# Patient Record
Sex: Female | Born: 1945 | Race: Black or African American | Hispanic: No | Marital: Married | Smoking: Former smoker
Health system: Southern US, Community
[De-identification: ages and names within clinical notes are randomized; demographics above are authoritative.]

## PROBLEM LIST (undated history)

## (undated) DIAGNOSIS — Z8489 Family history of other specified conditions: Secondary | ICD-10-CM

## (undated) DIAGNOSIS — K219 Gastro-esophageal reflux disease without esophagitis: Secondary | ICD-10-CM

## (undated) DIAGNOSIS — I10 Essential (primary) hypertension: Secondary | ICD-10-CM

## (undated) DIAGNOSIS — IMO0002 Reserved for concepts with insufficient information to code with codable children: Secondary | ICD-10-CM

## (undated) DIAGNOSIS — D649 Anemia, unspecified: Secondary | ICD-10-CM

## (undated) DIAGNOSIS — I209 Angina pectoris, unspecified: Secondary | ICD-10-CM

## (undated) DIAGNOSIS — C50919 Malignant neoplasm of unspecified site of unspecified female breast: Secondary | ICD-10-CM

## (undated) HISTORY — DX: Gastro-esophageal reflux disease without esophagitis: K21.9

## (undated) HISTORY — DX: Reserved for concepts with insufficient information to code with codable children: IMO0002

## (undated) HISTORY — PX: TUBAL LIGATION: SHX77

## (undated) HISTORY — DX: Essential (primary) hypertension: I10

## (undated) HISTORY — PX: DILATION AND CURETTAGE OF UTERUS: SHX78

## (undated) HISTORY — PX: OVARIAN CYST REMOVAL: SHX89

## (undated) HISTORY — PX: MASTECTOMY: SHX3

## (undated) HISTORY — DX: Malignant neoplasm of unspecified site of unspecified female breast: C50.919

---

## 2000-11-19 ENCOUNTER — Other Ambulatory Visit: Admission: RE | Admit: 2000-11-19 | Discharge: 2000-11-19 | Payer: Self-pay | Admitting: Family Medicine

## 2001-05-12 ENCOUNTER — Encounter: Payer: Self-pay | Admitting: Family Medicine

## 2001-05-12 ENCOUNTER — Ambulatory Visit (HOSPITAL_COMMUNITY): Admission: RE | Admit: 2001-05-12 | Discharge: 2001-05-12 | Payer: Self-pay | Admitting: Family Medicine

## 2001-09-02 ENCOUNTER — Ambulatory Visit (HOSPITAL_COMMUNITY): Admission: RE | Admit: 2001-09-02 | Discharge: 2001-09-02 | Payer: Self-pay | Admitting: Family Medicine

## 2001-09-02 ENCOUNTER — Encounter: Payer: Self-pay | Admitting: Family Medicine

## 2001-09-18 ENCOUNTER — Ambulatory Visit (HOSPITAL_COMMUNITY): Admission: RE | Admit: 2001-09-18 | Discharge: 2001-09-18 | Payer: Self-pay | Admitting: Family Medicine

## 2001-09-18 ENCOUNTER — Encounter: Payer: Self-pay | Admitting: Family Medicine

## 2001-09-23 ENCOUNTER — Other Ambulatory Visit: Admission: RE | Admit: 2001-09-23 | Discharge: 2001-09-23 | Payer: Self-pay | Admitting: Obstetrics and Gynecology

## 2002-05-24 ENCOUNTER — Encounter: Payer: Self-pay | Admitting: Family Medicine

## 2002-05-24 ENCOUNTER — Ambulatory Visit (HOSPITAL_COMMUNITY): Admission: RE | Admit: 2002-05-24 | Discharge: 2002-05-24 | Payer: Self-pay | Admitting: Family Medicine

## 2003-02-28 ENCOUNTER — Ambulatory Visit (HOSPITAL_COMMUNITY): Admission: RE | Admit: 2003-02-28 | Discharge: 2003-02-28 | Payer: Self-pay | Admitting: Internal Medicine

## 2003-02-28 HISTORY — PX: COLONOSCOPY: SHX174

## 2003-06-06 ENCOUNTER — Ambulatory Visit (HOSPITAL_COMMUNITY): Admission: RE | Admit: 2003-06-06 | Discharge: 2003-06-06 | Payer: Self-pay | Admitting: Family Medicine

## 2004-03-20 ENCOUNTER — Ambulatory Visit: Payer: Self-pay | Admitting: Family Medicine

## 2004-03-24 ENCOUNTER — Ambulatory Visit: Payer: Self-pay | Admitting: Family Medicine

## 2004-07-21 ENCOUNTER — Ambulatory Visit: Payer: Self-pay | Admitting: Family Medicine

## 2004-07-25 ENCOUNTER — Ambulatory Visit (HOSPITAL_COMMUNITY): Admission: RE | Admit: 2004-07-25 | Discharge: 2004-07-25 | Payer: Self-pay | Admitting: Unknown Physician Specialty

## 2004-07-30 ENCOUNTER — Ambulatory Visit (HOSPITAL_COMMUNITY): Admission: RE | Admit: 2004-07-30 | Discharge: 2004-07-30 | Payer: Self-pay | Admitting: Family Medicine

## 2004-09-15 ENCOUNTER — Ambulatory Visit: Payer: Self-pay | Admitting: Family Medicine

## 2004-12-22 ENCOUNTER — Ambulatory Visit: Payer: Self-pay | Admitting: Family Medicine

## 2005-05-05 ENCOUNTER — Ambulatory Visit: Payer: Self-pay | Admitting: Family Medicine

## 2005-06-16 ENCOUNTER — Ambulatory Visit: Payer: Self-pay | Admitting: Family Medicine

## 2005-08-10 ENCOUNTER — Ambulatory Visit (HOSPITAL_COMMUNITY): Admission: RE | Admit: 2005-08-10 | Discharge: 2005-08-10 | Payer: Self-pay | Admitting: Family Medicine

## 2005-08-19 ENCOUNTER — Ambulatory Visit: Payer: Self-pay | Admitting: Family Medicine

## 2005-11-19 ENCOUNTER — Ambulatory Visit: Payer: Self-pay | Admitting: Family Medicine

## 2006-01-06 ENCOUNTER — Other Ambulatory Visit: Admission: RE | Admit: 2006-01-06 | Discharge: 2006-01-06 | Payer: Self-pay | Admitting: Family Medicine

## 2006-01-06 ENCOUNTER — Ambulatory Visit: Payer: Self-pay | Admitting: Family Medicine

## 2006-01-07 ENCOUNTER — Ambulatory Visit (HOSPITAL_COMMUNITY): Admission: RE | Admit: 2006-01-07 | Discharge: 2006-01-07 | Payer: Self-pay | Admitting: Family Medicine

## 2006-01-29 ENCOUNTER — Ambulatory Visit: Payer: Self-pay | Admitting: Family Medicine

## 2006-01-29 ENCOUNTER — Ambulatory Visit (HOSPITAL_COMMUNITY): Admission: RE | Admit: 2006-01-29 | Discharge: 2006-01-29 | Payer: Self-pay | Admitting: Family Medicine

## 2006-02-15 ENCOUNTER — Ambulatory Visit: Payer: Self-pay | Admitting: Family Medicine

## 2006-04-14 ENCOUNTER — Ambulatory Visit: Payer: Self-pay | Admitting: Family Medicine

## 2006-06-10 ENCOUNTER — Ambulatory Visit: Payer: Self-pay | Admitting: Family Medicine

## 2006-06-15 ENCOUNTER — Encounter: Payer: Self-pay | Admitting: Family Medicine

## 2006-06-15 LAB — CONVERTED CEMR LAB
ALT: 16 units/L (ref 0–35)
Bilirubin, Direct: 0.1 mg/dL (ref 0.0–0.3)
CO2: 22 meq/L (ref 19–32)
Chloride: 106 meq/L (ref 96–112)
Cholesterol: 163 mg/dL (ref 0–200)
Glucose, Bld: 109 mg/dL — ABNORMAL HIGH (ref 70–99)
Hgb A1c MFr Bld: 6.7 % — ABNORMAL HIGH (ref 4.6–6.1)
Indirect Bilirubin: 0.2 mg/dL (ref 0.0–0.9)
Potassium: 4.9 meq/L (ref 3.5–5.3)
Total Bilirubin: 0.3 mg/dL (ref 0.3–1.2)
Total CHOL/HDL Ratio: 2.5
VLDL: 12 mg/dL (ref 0–40)

## 2006-08-16 ENCOUNTER — Ambulatory Visit (HOSPITAL_COMMUNITY): Admission: RE | Admit: 2006-08-16 | Discharge: 2006-08-16 | Payer: Self-pay | Admitting: Family Medicine

## 2006-09-20 ENCOUNTER — Ambulatory Visit: Payer: Self-pay | Admitting: Family Medicine

## 2006-09-21 ENCOUNTER — Encounter: Payer: Self-pay | Admitting: Family Medicine

## 2007-01-04 ENCOUNTER — Ambulatory Visit: Payer: Self-pay | Admitting: Family Medicine

## 2007-01-10 ENCOUNTER — Ambulatory Visit: Payer: Self-pay | Admitting: Family Medicine

## 2007-01-17 ENCOUNTER — Ambulatory Visit (HOSPITAL_COMMUNITY): Admission: RE | Admit: 2007-01-17 | Discharge: 2007-01-17 | Payer: Self-pay | Admitting: Family Medicine

## 2007-01-25 ENCOUNTER — Encounter: Admission: RE | Admit: 2007-01-25 | Discharge: 2007-01-25 | Payer: Self-pay | Admitting: Family Medicine

## 2007-02-10 ENCOUNTER — Encounter: Admission: RE | Admit: 2007-02-10 | Discharge: 2007-02-10 | Payer: Self-pay | Admitting: Family Medicine

## 2007-02-16 ENCOUNTER — Encounter: Payer: Self-pay | Admitting: Family Medicine

## 2007-02-16 ENCOUNTER — Ambulatory Visit: Payer: Self-pay | Admitting: Family Medicine

## 2007-02-16 ENCOUNTER — Other Ambulatory Visit: Admission: RE | Admit: 2007-02-16 | Discharge: 2007-02-16 | Payer: Self-pay | Admitting: Family Medicine

## 2007-02-16 LAB — CONVERTED CEMR LAB: Pap Smear: NORMAL

## 2007-02-17 ENCOUNTER — Encounter: Payer: Self-pay | Admitting: Family Medicine

## 2007-05-30 ENCOUNTER — Encounter: Admission: RE | Admit: 2007-05-30 | Discharge: 2007-05-30 | Payer: Self-pay | Admitting: Family Medicine

## 2007-06-06 ENCOUNTER — Ambulatory Visit: Payer: Self-pay | Admitting: Family Medicine

## 2007-06-10 ENCOUNTER — Encounter: Payer: Self-pay | Admitting: Family Medicine

## 2007-06-10 LAB — CONVERTED CEMR LAB
AST: 12 units/L (ref 0–37)
Albumin: 4.1 g/dL (ref 3.5–5.2)
BUN: 13 mg/dL (ref 6–23)
CO2: 23 meq/L (ref 19–32)
Calcium: 9.3 mg/dL (ref 8.4–10.5)
Chloride: 105 meq/L (ref 96–112)
Glucose, Bld: 121 mg/dL — ABNORMAL HIGH (ref 70–99)
Potassium: 4.7 meq/L (ref 3.5–5.3)
Total CHOL/HDL Ratio: 2.2
Triglycerides: 58 mg/dL (ref <150)
VLDL: 12 mg/dL (ref 0–40)

## 2007-08-30 ENCOUNTER — Ambulatory Visit (HOSPITAL_COMMUNITY): Admission: RE | Admit: 2007-08-30 | Discharge: 2007-08-30 | Payer: Self-pay | Admitting: Family Medicine

## 2007-10-04 ENCOUNTER — Ambulatory Visit: Payer: Self-pay | Admitting: Family Medicine

## 2007-10-12 ENCOUNTER — Encounter: Payer: Self-pay | Admitting: Family Medicine

## 2007-10-12 DIAGNOSIS — K219 Gastro-esophageal reflux disease without esophagitis: Secondary | ICD-10-CM | POA: Insufficient documentation

## 2007-10-12 DIAGNOSIS — I10 Essential (primary) hypertension: Secondary | ICD-10-CM

## 2007-10-12 DIAGNOSIS — F329 Major depressive disorder, single episode, unspecified: Secondary | ICD-10-CM | POA: Insufficient documentation

## 2007-10-12 DIAGNOSIS — E119 Type 2 diabetes mellitus without complications: Secondary | ICD-10-CM

## 2007-10-12 DIAGNOSIS — F411 Generalized anxiety disorder: Secondary | ICD-10-CM | POA: Insufficient documentation

## 2007-10-12 DIAGNOSIS — M549 Dorsalgia, unspecified: Secondary | ICD-10-CM

## 2008-01-19 ENCOUNTER — Encounter: Payer: Self-pay | Admitting: Family Medicine

## 2008-02-18 ENCOUNTER — Encounter: Payer: Self-pay | Admitting: Family Medicine

## 2008-02-28 ENCOUNTER — Encounter: Payer: Self-pay | Admitting: Family Medicine

## 2008-02-28 ENCOUNTER — Ambulatory Visit: Payer: Self-pay | Admitting: Family Medicine

## 2008-02-28 ENCOUNTER — Other Ambulatory Visit: Admission: RE | Admit: 2008-02-28 | Discharge: 2008-02-28 | Payer: Self-pay | Admitting: Family Medicine

## 2008-02-28 DIAGNOSIS — M549 Dorsalgia, unspecified: Secondary | ICD-10-CM | POA: Insufficient documentation

## 2008-02-28 DIAGNOSIS — R0789 Other chest pain: Secondary | ICD-10-CM | POA: Insufficient documentation

## 2008-03-07 ENCOUNTER — Ambulatory Visit: Payer: Self-pay | Admitting: Internal Medicine

## 2008-03-26 ENCOUNTER — Encounter: Payer: Self-pay | Admitting: Family Medicine

## 2008-03-26 ENCOUNTER — Ambulatory Visit: Payer: Self-pay | Admitting: Internal Medicine

## 2008-03-26 ENCOUNTER — Ambulatory Visit (HOSPITAL_COMMUNITY): Admission: RE | Admit: 2008-03-26 | Discharge: 2008-03-26 | Payer: Self-pay | Admitting: Internal Medicine

## 2008-03-26 HISTORY — PX: COLONOSCOPY: SHX174

## 2008-04-10 ENCOUNTER — Encounter: Payer: Self-pay | Admitting: Family Medicine

## 2008-04-25 ENCOUNTER — Telehealth: Payer: Self-pay | Admitting: Family Medicine

## 2008-05-30 ENCOUNTER — Ambulatory Visit: Payer: Self-pay | Admitting: Family Medicine

## 2008-06-01 ENCOUNTER — Encounter: Payer: Self-pay | Admitting: Family Medicine

## 2008-06-01 LAB — CONVERTED CEMR LAB
Creatinine, Urine: 85.3 mg/dL
Microalb Creat Ratio: 21.5 mg/g (ref 0.0–30.0)
Microalb, Ur: 1.83 mg/dL (ref 0.00–1.89)

## 2008-06-23 ENCOUNTER — Encounter: Payer: Self-pay | Admitting: Family Medicine

## 2008-06-23 LAB — CONVERTED CEMR LAB
Bilirubin, Direct: <0.1 mg/dL (ref 0.0–0.3)
CO2: 25 meq/L (ref 19–32)
Calcium: 9.3 mg/dL (ref 8.4–10.5)
Cholesterol: 150 mg/dL (ref 0–200)
Glucose, Bld: 113 mg/dL — ABNORMAL HIGH (ref 70–99)
LDL Cholesterol: 75 mg/dL (ref 0–99)
Sodium: 140 meq/L (ref 135–145)
Total Bilirubin: 0.2 mg/dL — ABNORMAL LOW (ref 0.3–1.2)
Total Protein: 7.2 g/dL (ref 6.0–8.3)
Triglycerides: 62 mg/dL (ref <150)
VLDL: 12 mg/dL (ref 0–40)

## 2008-07-10 ENCOUNTER — Ambulatory Visit: Payer: Self-pay | Admitting: Family Medicine

## 2008-07-10 ENCOUNTER — Ambulatory Visit (HOSPITAL_COMMUNITY): Admission: RE | Admit: 2008-07-10 | Discharge: 2008-07-10 | Payer: Self-pay | Admitting: Family Medicine

## 2008-07-10 DIAGNOSIS — M25519 Pain in unspecified shoulder: Secondary | ICD-10-CM | POA: Insufficient documentation

## 2008-07-10 DIAGNOSIS — M542 Cervicalgia: Secondary | ICD-10-CM | POA: Insufficient documentation

## 2008-07-16 ENCOUNTER — Encounter: Admission: RE | Admit: 2008-07-16 | Discharge: 2008-07-16 | Payer: Self-pay | Admitting: Family Medicine

## 2008-07-25 ENCOUNTER — Encounter: Payer: Self-pay | Admitting: Family Medicine

## 2008-07-30 ENCOUNTER — Encounter: Admission: RE | Admit: 2008-07-30 | Discharge: 2008-07-30 | Payer: Self-pay | Admitting: Family Medicine

## 2008-07-31 ENCOUNTER — Telehealth: Payer: Self-pay | Admitting: Family Medicine

## 2008-08-01 ENCOUNTER — Encounter: Payer: Self-pay | Admitting: Family Medicine

## 2008-08-02 ENCOUNTER — Encounter: Payer: Self-pay | Admitting: Family Medicine

## 2008-09-03 ENCOUNTER — Ambulatory Visit (HOSPITAL_COMMUNITY): Admission: RE | Admit: 2008-09-03 | Discharge: 2008-09-03 | Payer: Self-pay | Admitting: Family Medicine

## 2008-09-04 ENCOUNTER — Ambulatory Visit: Payer: Self-pay | Admitting: Family Medicine

## 2008-09-04 LAB — CONVERTED CEMR LAB: Hgb A1c MFr Bld: 6 %

## 2008-09-06 ENCOUNTER — Encounter: Payer: Self-pay | Admitting: Family Medicine

## 2008-09-09 DIAGNOSIS — J019 Acute sinusitis, unspecified: Secondary | ICD-10-CM | POA: Insufficient documentation

## 2008-11-09 ENCOUNTER — Encounter: Payer: Self-pay | Admitting: Family Medicine

## 2009-10-24 ENCOUNTER — Ambulatory Visit (HOSPITAL_COMMUNITY): Admission: RE | Admit: 2009-10-24 | Discharge: 2009-10-24 | Payer: Self-pay | Admitting: Family Medicine

## 2010-05-11 LAB — CONVERTED CEMR LAB
Glucose, Bld: 137 mg/dL
Hgb A1c MFr Bld: 6.3 %
OCCULT 1: NEGATIVE

## 2010-08-26 NOTE — H&P (Signed)
NAME:  Shannon Romero, Shannon Romero                ACCOUNT NO.:  [id number]   MEDICAL RECORD NO.:  [id number]           PATIENT TYPE:  AMB   LOCATION:  DAY                           FACILITY:  [facility]   PHYSICIAN:  R. Roetta Sessions, M.D. DATE OF BIRTH:  Dec 01, 1945   DATE OF ADMISSION:  DATE OF DISCHARGE:  LH                              HISTORY & PHYSICAL   CHIEF COMPLAINT:  Time for a colonoscopy, family history of colon  cancer.   HISTORY OF PRESENT ILLNESS:  The patient is a very pleasant 65 year old  [place] female, who was seen last at the time of colonoscopy in  November 2004.  At that time, she had microcytic anemia secondary to  iron deficiency, felt to be due to uterine blood loss.  She has a family  history positive for colon cancer in her father diagnosed at age 78.  At  the time of colonoscopy, she had a few scattered small diverticula  throughout the colon, no evidence of polyps or neoplasm.  She was  advised to follow up in 5 years due to her family history.  She denies  any melena, rectal bleeding, abdominal pain, nausea, or vomiting.  She  has occasional heartburn, but not regularly.  She has occasional  constipation for which she takes stool softeners or  Epsom salt.  Denies  any unintentional weight loss.   CURRENT MEDICATIONS:  1. Metformin 1 g daily.  2. Amlodipine daily.  3. Meloxicam daily.  4. Depression medication t.i.d.  5. Hydrocodone b.i.d.  6. Aspirin 81 mg daily.   ALLERGIES:  No known drug allergies.   PAST MEDICAL HISTORY:  1. Diabetes mellitus.  2. Hypertension.  3. Depression.  4. Bulging disk.   PAST SURGICAL HISTORY:  She had surgery for ovarian cyst, cesarean  section, and tubal ligation.   FAMILY HISTORY:  Father deceased at age 54 due to colorectal cancer.  Mother deceased at age 19 due to MI.  She has multiple siblings with  diabetes.  One brother died of lung cancer.   SOCIAL HISTORY:  She is married.  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  She is  a nonsmoker.  No alcohol use.  She smoked half pack per day for 30 years  in the past.  She has 1 grown healthy son.   REVIEW OF SYSTEMS:  GI:  See HPI.  CONSTITUTIONAL/CARDIOPULMONARY:  No  chest pain, shortness of breath, palpitations, or cough.  GENITOURINARY:  No dysuria or hematuria.   PHYSICAL EXAMINATION:  VITAL SIGNS:  Weight 214.5, height 5 feet 4  inches, temp 98.1, blood pressure 120/84, and pulse 80.  GENERAL:  Pleasant, obese, black female in no acute distress.  SKIN:  Warm and dry.  No jaundice.  HEENT:  Sclerae nonicteric.  Oropharyngeal mucosa moist and pink.  No  lesions, erythema, or exudate.  No lymphadenopathy or thyromegaly.  CHEST:  Lungs are clear to auscultation.  CARDIAC:  Regular rate and rhythm.  Normal S1 and S2.  No  murmurs, rubs,  or gallops.  ABDOMEN:  Positive bowel sounds.  Abdomen is soft, nontender, and  nondistended.  No organomegaly or masses.  No rebound or guarding.  No  abdominal bruits or hernias.  LOWER EXTREMITIES:  No edema.   IMPRESSION:  Shannon Romero is a pleasant 65 year old African American female  with family history of colorectal cancer in a first-degree relative at  age 27.  She is due for surveillance colonoscopy at this time.   RECOMMENDATIONS:  1. Colonoscopy in the near future with Dr. Jena Gauss.  2. We will have her hold her meloxicam and aspirin for 4 days prior      procedure.      [name], Pricilla Larsson, M.D.     LL/MEDQ  D:  03/07/2008  T:  03/08/2008  Job:  [id number]   cc:   Mila Homer. Sudie Bailey, M.D.  Fax: [phone]

## 2010-08-26 NOTE — Op Note (Signed)
NAME:  Shannon Romero, Shannon Romero                ACCOUNT NO.:  [id number]   MEDICAL RECORD NO.:  [id number]          PATIENT TYPE:  AMB   LOCATION:  DAY                           FACILITY:  [facility]   PHYSICIAN:  R. Roetta Sessions, M.D. DATE OF BIRTH:  12-03-1945   DATE OF PROCEDURE:  03/26/2008  DATE OF DISCHARGE:                               OPERATIVE REPORT   INDICATIONS FOR PROCEDURE:  A 65 year old African American female with  no lower GI tract symptoms, presents for high-risk screening  colonoscopy.  Past family history of colon cancer in a first-degree  relative at a young age.  Last colonoscopy was in November 2004.  Risks,  benefits, alternatives, and limitations of this procedure have been  reviewed, questions answered.  Please see documentation in medical  record.   PROCEDURE NOTE:  O2 saturation, blood pressure, and pulse of this  patient was monitored throughout the entire procedure.  Conscious  sedation, Versed 6 mg IV and Demerol 125 mg IV in divided doses.   INSTRUMENT:  Pentax video chip system.   FINDINGS:  Digital rectal exam revealed no abnormalities.  Endoscopic  findings:  Prep was good.  Colon:  Colonic mucosa was surveyed from the  rectosigmoid junction through the left transverse, right colon,  appendiceal orifice, ileocecal valve, and cecum.  These structures were  well seen, photographed for the record.  Terminal ileum was intubated 10  cm.  From this level, scope was slowly and cautiously withdrawn.  All  previously mentioned mucosal surfaces were again seen.  Terminal ileum  was intubated to 10 cm.  The patient then had a scattered pancolonic  diverticula and colonic mucosa appeared normal.  Terminal ileum mucosa  appeared normal.  Scope was pulled down the rectum with thorough  examination of rectal mucosa, including retroflexed view of the anal  verge demonstrated no abnormalities.  The patient tolerated the  procedure well and was reacted in Endoscopy.   IMPRESSION:  Normal rectum, scattered pancolonic diverticula and colonic  mucosa appeared normal, normal terminal ileum.   RECOMMENDATIONS:  Repeat screening colonoscopy in 5 years.  Diverticulosis literature provided to Ms. Speagle.      Jonathon Bellows, M.D.  Electronically Signed     RMR/MEDQ  D:  03/26/2008  T:  03/27/2008  Job:  [id number]   cc:   Milus Mallick. Lodema Hong, M.D.  Fax: [phone]

## 2010-08-26 NOTE — H&P (Signed)
NAME:  Worrell, Adlynn                ACCOUNT NO.:  [id number]   MEDICAL RECORD NO.:  [id number]          PATIENT TYPE:  AMB   LOCATION:  DAY                           FACILITY:  [facility]   PHYSICIAN:  R. Roetta Sessions, M.D. DATE OF BIRTH:  06-25-1945   DATE OF ADMISSION:  DATE OF DISCHARGE:  LH                              HISTORY & PHYSICAL   CHIEF COMPLAINT:  Time for a colonoscopy, family history of colon  cancer.   HISTORY OF PRESENT ILLNESS:  The patient is a very pleasant 65 year old  [place] female, who was seen last at the time of colonoscopy in  November 2004.  At that time, she had microcytic anemia secondary to  iron deficiency, felt to be due to uterine blood loss.  She has a family  history positive for colon cancer in her father diagnosed at age 54.  At  the time of colonoscopy, she had a few scattered small diverticula  throughout the colon, no evidence of polyps or neoplasm.  She was  advised to follow up in 5 years due to her family history.  She denies  any melena, rectal bleeding, abdominal pain, nausea, or vomiting.  She  has occasional heartburn, but not regularly.  She has occasional  constipation for which she takes stool softeners or  Epsom salt.  Denies  any unintentional weight loss.   CURRENT MEDICATIONS:  1. Metformin 1 g daily.  2. Amlodipine daily.  3. Meloxicam daily.  4. Depression medication t.i.d.  5. Hydrocodone b.i.d.  6. Aspirin 81 mg daily.   ALLERGIES:  No known drug allergies.   PAST MEDICAL HISTORY:  1. Diabetes mellitus.  2. Hypertension.  3. Depression.  4. Bulging disk.   PAST SURGICAL HISTORY:  She had surgery for ovarian cyst, cesarean  section, and tubal ligation.   FAMILY HISTORY:  Father deceased at age 60 due to colorectal cancer.  Mother deceased at age 86 due to MI.  She has multiple siblings with  diabetes.  One brother died of lung cancer.   SOCIAL HISTORY:  She is married.  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  She is  a nonsmoker.  No alcohol use.  She smoked half pack per day for 30 years  in the past.  She has 1 grown healthy son.   REVIEW OF SYSTEMS:  GI:  See HPI.  CONSTITUTIONAL/CARDIOPULMONARY:  No  chest pain, shortness of breath, palpitations, or cough.  GENITOURINARY:  No dysuria or hematuria.   PHYSICAL EXAMINATION:  VITAL SIGNS:  Weight 214.5, height 5 feet 4  inches, temp 98.1, blood pressure 120/84, and pulse 80.  GENERAL:  Pleasant, obese, black female in no acute distress.  SKIN:  Warm and dry.  No jaundice.  HEENT:  Sclerae nonicteric.  Oropharyngeal mucosa moist and pink.  No  lesions, erythema, or exudate.  No lymphadenopathy or thyromegaly.  CHEST:  Lungs are clear to auscultation.  CARDIAC:  Regular rate and rhythm.  Normal S1 and S2.  No murmurs,  rubs,  or gallops.  ABDOMEN:  Positive bowel sounds.  Abdomen is soft, nontender, and  nondistended.  No organomegaly or masses.  No rebound or guarding.  No  abdominal bruits or hernias.  LOWER EXTREMITIES:  No edema.   IMPRESSION:  Ms. Choe is a pleasant 65 year old African American female  with family history of colorectal cancer in a first-degree relative at  age 9.  She is due for surveillance colonoscopy at this time.   RECOMMENDATIONS:  1. Colonoscopy in the near future with Dr. Jena Gauss.  2. We will have her hold her meloxicam and aspirin for 4 days prior      procedure.      [name], P.AJonathon Bellows, M.D.  Electronically Signed    LL/MEDQ  D:  03/07/2008  T:  03/08/2008  Job:  [id number]   cc:   Mila Homer. Sudie Bailey, M.D.  Fax: [phone]

## 2010-08-29 NOTE — H&P (Signed)
NAME:  Shannon Romero, Shannon Romero NO.:  [id number]   MEDICAL RECORD NO.:  [id number]                  PATIENT TYPE:   LOCATION:                                       FACILITY:   PHYSICIAN:  Lionel December, M.D.                 DATE OF BIRTH:  March 23, 1946   DATE OF ADMISSION:  DATE OF DISCHARGE:                                HISTORY & PHYSICAL   CHIEF COMPLAINT:  Anemia, family history of colon carcinoma and needs  colonoscopy.   HISTORY OF PRESENT ILLNESS:  The patient is a 65 year old African-American  female who presents to our office prior to colonoscopy due to a family  history of colon carcinoma in father who was diagnosed at age 77.  The  patient has not yet had screening colonoscopy.  She also has brought her  records from Dr. Michelle Nasuti office where she has had a normally low  hemoglobin at 12.0 and hematocrit at 36.2 with a low MCV of 74.6.  It was  felt that her mild anemia could possibly be due to menorrhagia.  She also  takes aspirin 81 mg daily and recently, approximately two months ago,  discontinued Vioxx.  She reports her bowel movements are normal except for  constipation where she has bowel movements every four to five days.  She has  a history of taking Ex-Lax two tablets which usually results in good relief.  However, sometimes she is required to use Epsom salts.  She denies any  nausea, vomiting or abdominal pain.  She denies any melena or hematochezia.  She denies any history of GERD or peptic ulcer disease.  She denies any  dyspepsia or indigestion.   PAST MEDICAL HISTORY:  1. Diabetes.  2. Depression.  3. Chronic back pain.  4. Dysmenorrhea.   PAST SURGICAL HISTORY:  1. Ovarian cystectomy.  2. Tubal ligation.  3. Cesarean section.   MEDICATIONS:  1. Aspirin 81 mg daily.  2. Darvocet-N 100 t.i.d.  3. Glucophage 500 mg two tablets q.a.m. and one tablet q.p.m.  4. Megace 20 mg daily.  5. Tylenol Arthritis p.r.n.  6. Zoloft 50  mg q.h.s.   ALLERGIES:  No known drug allergies.   FAMILY HISTORY:  Father was diagnosed at age 24 with colon carcinoma and has  since deceased.  There is also a positive family history for diabetes as  well as hypertension and lung carcinoma.   SOCIAL HISTORY:  She has been married for 35 years.  She has one grown  healthy son.  She is employed full time with [employer] and cares  for her elderly mother-in-law.  She currently does not smoke; however, she  reports a half pack per day for 30 years which she quit several years ago.  She denies any alcohol or drug use.   REVIEW OF SYMPTOMS:  CONSTITUTIONAL:  Weight is slightly increasing which  she feels is secondary to increased caloric intake.  She also has occasional  chills, however, she denies any fever.  NEUROLOGIC:  She denies any  weakness.  ENDOCRINE:  She does have diabetes with the last hemoglobin A1c  of 6.9 and being followed by Dr. Sudie Bailey.  GI:  See history of present  illness.  CARDIOVASCULAR:  She denies any chest pain or palpitations.  PULMONARY:  She denies any shortness of breath, dyspnea or cough.  SKIN:  She denies any rash except for occasional eczema.   PHYSICAL EXAMINATION:  VITAL SIGNS:  Weight is 220 pounds, height 63 inches,  temperature 98.3, blood pressure 130/80, pulse 70.  GENERAL:  The patient is a 65 year old obese African-American female who  appears her stated age.  She is well developed, well nourished and in no  apparent distress.  HEENT:  Sclerae are clear and anicteric.  Conjunctivae are pink.  Oropharynx  is pink and moist with good dentition.  Neck is supple without any masses or  thyromegaly.  CHEST:  Heart is regular rate and rhythm without murmurs, clicks, rubs or  gallops.  LUNGS:  Clear to auscultation bilaterally.  ABDOMEN:  Obese with a vertical cesarean section scar which is well healed.  She does have positive bowel sounds times four. Her abdomen is soft, non-  tender and  non-distended.  There is no palpable organomegaly.  EXTREMITIES:  2+ pedal pulses bilaterally.  No pedal edema.   LABORATORY DATA:  CBC from January 26, 2003:  WBC 9.7, hemoglobin 13.3,  hematocrit 40.9, platelets 305,000, MCV 79.7.  Sodium 140, potassium 3.8,  chloride 107, C02 22, glucose 83, BUN 8, creatinine 0.8.  Total bilirubin  0.4, alkaline phosphatase 71, AST 12, ALT 20, total protein 8.1, albumin  4.4, calcium 9.6.  Folate was greater than 20, iron 42, TIBC 324, TIBC %  saturation 13.   ASSESSMENT:  The patient is a 65 year old African-American female with a  known family history of colorectal carcinoma.  She also presents with slight  microcytic anemia.  She does have a history of dysmenorrhea and menorrhagia  which may be a culprit for her anemia.  However, we cannot rule out an  occult gastrointestinal bleed as well.  I discussed with the patient the  dire need for colonoscopy by Dr. Karilyn Cota.  I also discussed the risks and  benefits which include, but are not limited to, bleeding, perforation and  infection.  She agreed with this plan.  I have also asked her to decrease  her diabetes medication to half dose the day prior to and of the procedure.  She will also hold her aspirin for three days prior to the procedure.  Consent will be obtained.    RECOMMENDATIONS:  1. Colonoscopy to be scheduled with Dr. Karilyn Cota; consent will be obtained.  2. Medication adjustments as directed.  3. Follow-up pending colonoscopy results.     _____________________________________  ___________________________________________  Nicholas Lose, N.P.               Lionel December, M.D.   KC/MEDQ  D:  02/13/2003  T:  02/13/2003  Job:  [id number]   cc:   Mila Homer. Sudie Bailey, M.D.  [address] [place], [place] [address]  Fax: [phone]

## 2010-08-29 NOTE — Op Note (Signed)
NAME:  Shannon Romero, Shannon Romero                          ACCOUNT NO.:  [id number]   MEDICAL RECORD NO.:  [id number]                   PATIENT TYPE:  AMB   LOCATION:  DAY                                  FACILITY:  [facility]   PHYSICIAN:  Lionel December, M.D.                 DATE OF BIRTH:  [birth year]   DATE OF PROCEDURE:  02/28/2003  DATE OF DISCHARGE:                                 OPERATIVE REPORT   PROCEDURE:  Total colonoscopy.   ENDOSCOPIST:  Lionel December, M.D.   INDICATIONS:  This patient is a 65 year old African-American female who was  recently found to have microcytic anemia secondary to iron deficiency.  This  is possibly related to uterine blood loss.  She does not have any GI  symptoms; however, the family history is positive for colon carcinoma in a  father who was diagnosed at age 37.  She is, therefore, undergoing  colonoscopy both for diagnostic and screening purposes.  The procedure and  risks were reviewed with the patient and informed consent was obtained.   PREOPERATIVE MEDICATIONS:  Demerol 50 mg IV and Versed 6 mg IV.   FINDINGS:  Procedure performed in endoscopy suite.  The patient's vital  signs and O2 saturation were monitored during the procedure and remained  stable.  The patient was placed in the left lateral recumbent position and  rectal examination was performed.  No abnormality noted on external or  digital exam.   Olympus videoscope was placed in the rectum and advanced under vision into  the sigmoid colon and beyond.  Preparation was satisfactory.  The scope was  passed to the cecum which was identified by appendiceal orifice and the  ileocecal valve.  Pictures were taken for the record.  As the scope was  withdrawn the colonic mucosa was once again carefully examined.  There were  a few small scattered diverticula throughout the colon.  No polyps or tumor  masses were noted. The rectal mucosa was normal.   The scope was retroflexed to examine the anorectal  junction and prominent  anal papilla were noted.  The endoscope was straightened and withdrawn.  The  patient tolerated the procedure well.   FINAL DIAGNOSES:  1. A few scattered small diverticula throughout the colon.  2. No evidence of colonic polyps or neoplasm.  3. Polyps or other lesions which potentially cause GI blood loss.   RECOMMENDATIONS:  1. She will resume her usual diet.  2. High fiber diet.  3. Will start her on ferrous sulfate 325 mg p.o. b.i.d.  4. She will follow with Dr. Sudie Bailey in 3 months when she will need to have     a CBC.  5. Given positive family history of colon carcinoma, it is recommended that     she return for screening exam 5 years from now.      ___________________________________________  Lionel December, M.D.   NR/MEDQ  D:  02/28/2003  T:  03/01/2003  Job:  [id number]   cc:   Mila Homer. Sudie Bailey, M.D.  [address] [place], [place] [address]  Fax: [phone]

## 2011-01-02 ENCOUNTER — Encounter: Payer: Self-pay | Admitting: Family Medicine

## 2011-01-05 ENCOUNTER — Ambulatory Visit (INDEPENDENT_AMBULATORY_CARE_PROVIDER_SITE_OTHER): Payer: Medicare HMO | Admitting: Family Medicine

## 2011-01-05 ENCOUNTER — Encounter: Payer: Self-pay | Admitting: Family Medicine

## 2011-01-05 ENCOUNTER — Other Ambulatory Visit: Payer: Self-pay | Admitting: Family Medicine

## 2011-01-05 VITALS — BP 124/70 | HR 84 | Resp 16 | Ht 63.0 in | Wt 216.1 lb

## 2011-01-05 DIAGNOSIS — Z13220 Encounter for screening for lipoid disorders: Secondary | ICD-10-CM

## 2011-01-05 DIAGNOSIS — R5383 Other fatigue: Secondary | ICD-10-CM

## 2011-01-05 DIAGNOSIS — I10 Essential (primary) hypertension: Secondary | ICD-10-CM

## 2011-01-05 DIAGNOSIS — Z23 Encounter for immunization: Secondary | ICD-10-CM

## 2011-01-05 DIAGNOSIS — M549 Dorsalgia, unspecified: Secondary | ICD-10-CM

## 2011-01-05 DIAGNOSIS — Z139 Encounter for screening, unspecified: Secondary | ICD-10-CM

## 2011-01-05 DIAGNOSIS — R5381 Other malaise: Secondary | ICD-10-CM

## 2011-01-05 DIAGNOSIS — E119 Type 2 diabetes mellitus without complications: Secondary | ICD-10-CM

## 2011-01-05 DIAGNOSIS — E669 Obesity, unspecified: Secondary | ICD-10-CM

## 2011-01-05 MED ORDER — INFLUENZA VAC TYPES A & B PF IM SUSP: 0.5000 mL | Freq: Once | INTRAMUSCULAR | Status: DC

## 2011-01-05 MED ORDER — HYDROCODONE-ACETAMINOPHEN 5-500 MG PO TABS: ORAL_TABLET | ORAL | Status: DC

## 2011-01-05 MED ORDER — PREDNISONE 5 MG PO KIT: 5.0000 mg | PACK | ORAL | Status: AC

## 2011-01-05 MED ORDER — METHYLPREDNISOLONE ACETATE 80 MG/ML IJ SUSP
80.0000 mg | Freq: Once | INTRAMUSCULAR | Status: AC
  Administered 2011-01-05: 80 mg via INTRAMUSCULAR

## 2011-01-05 MED ORDER — KETOROLAC TROMETHAMINE 60 MG/2ML IM SOLN
60.0000 mg | Freq: Once | INTRAMUSCULAR | Status: AC
  Administered 2011-01-05: 60 mg via INTRAMUSCULAR

## 2011-01-05 NOTE — Assessment & Plan Note (Addendum)
Will f/u on hBa1C to determine control

## 2011-01-05 NOTE — Patient Instructions (Addendum)
CPE in November.  Injections today for back pain radiating down right leg, med sent to the pharmacy, call in 3 weeks if you think you need to be referred for injection in spine  Fasting labs asap  Mammmogram will be scheduled  Tdap and flu vaccine today  It is important that you exercise regularly at least 30 minutes 5 times a week. If you develop chest pain, have severe difficulty breathing, or feel very tired, stop exercising immediately and seek medical attention   A healthy diet is rich in fruit, vegetables and whole grains. Poultry fish, nuts and beans are a healthy choice for protein rather then red meat. A low sodium diet and drinking 64 ounces of water daily is generally recommended. Oils and sweet should be limited. Carbohydrates especially for those who are diabetic or overweight, should be limited to 30-45 gram per meal. It is important to eat on a regular schedule, at least 3 times daily. Snacks should be primarily fruits, vegetables or nuts. You are referred for eye exam

## 2011-01-05 NOTE — Progress Notes (Signed)
  Subjective:    Patient ID: Shannon Romero, female    DOB: 10/16/1945, 65 y.o.   MRN: [id number]  HPI Pt is in to re-establish care, recently got onto medicare, Denies any new problems. Has not been testing blood sugars regularly, needs testing supplies and meter. C/o uncontrolled back pain wants help with this as far as medication and injections. Has had epidurals in the past, will wait to see what med she gets here does   Review of Systems See HPI Denies recent fever or chills. Denies sinus pressure, nasal congestion, ear pain or sore throat. Denies chest congestion, productive cough or wheezing. Denies chest pains, palpitations and leg swelling Denies abdominal pain, nausea, vomiting,diarrhea or constipation.   Denies dysuria, frequency, hesitancy or incontinence.  Denies headaches, seizures, numbness, or tingling. Denies depression, anxiety or insomnia. Denies skin break down or rash.        Objective:   Physical Exam  Patient alert and oriented and in no cardiopulmonary distress.  HEENT: No facial asymmetry, EOMI, no sinus tenderness,  oropharynx pink and moist.  Neck supple no adenopathy.  Chest: Clear to auscultation bilaterally.  CVS: S1, S2 no murmurs, no S3.  ABD: Soft non tender. Bowel sounds normal.  Ext: No edema  MS: Decreased  ROM spine,adeqiuate in  shoulders, hips and knees.  Skin: Intact, no ulcerations or rash noted.  Psych: Good eye contact, normal affect. Memory intact not anxious or depressed appearing.  CNS: CN 2-12 intact, power,  normal throughout.       Assessment & Plan:

## 2011-01-05 NOTE — Assessment & Plan Note (Signed)
Deteriorated, has established disc disease, anti inflammatories and vicodin at night as needed

## 2011-01-05 NOTE — Assessment & Plan Note (Signed)
Controlled, no change in medication  

## 2011-01-05 NOTE — Assessment & Plan Note (Signed)
Improved. Pt applauded on succesful weight loss through lifestyle change, and encouraged to continue same. Weight loss goal set for the next several months.  

## 2011-01-06 LAB — CBC WITH DIFFERENTIAL/PLATELET
Basophils Absolute: 0 10*3/uL (ref 0.0–0.1)
Basophils Relative: 0 % (ref 0–1)
HCT: 38.4 % (ref 36.0–46.0)
MCHC: 31.5 g/dL (ref 30.0–36.0)
Monocytes Absolute: 0.6 10*3/uL (ref 0.1–1.0)
Neutro Abs: 5.1 10*3/uL (ref 1.7–7.7)
Platelets: 281 10*3/uL (ref 150–400)
RDW: 17.2 % — ABNORMAL HIGH (ref 11.5–15.5)

## 2011-01-06 LAB — LIPID PANEL
Cholesterol: 152 mg/dL (ref 0–200)
Total CHOL/HDL Ratio: 3.2 Ratio
Triglycerides: 70 mg/dL (ref <150)
VLDL: 14 mg/dL (ref 0–40)

## 2011-01-07 ENCOUNTER — Telehealth: Payer: Self-pay | Admitting: *Deleted

## 2011-01-07 LAB — COMPLETE METABOLIC PANEL WITH GFR
AST: 23 U/L (ref 0–37)
Albumin: 4 g/dL (ref 3.5–5.2)
Alkaline Phosphatase: 66 U/L (ref 39–117)
BUN: 23 mg/dL (ref 6–23)
Creat: 1.05 mg/dL (ref 0.50–1.10)
Potassium: 4.5 mEq/L (ref 3.5–5.3)

## 2011-01-07 LAB — MICROALBUMIN / CREATININE URINE RATIO
Creatinine, Urine: 73.9 mg/dL
Microalb Creat Ratio: 6.8 mg/g (ref 0.0–30.0)
Microalb, Ur: 0.5 mg/dL (ref 0.00–1.89)

## 2011-01-07 NOTE — Telephone Encounter (Signed)
Patient aware of lab results, mailed info on diabetic class at [facility]

## 2011-01-07 NOTE — Telephone Encounter (Signed)
Message copied by Diamantina Monks on Wed Jan 07, 2011  8:44 AM ------      Message from: Syliva Overman MD E      Created: Wed Jan 07, 2011  7:32 AM       pls advise pt to reduce carbs and sweets, encourage her to attend class to improve her blood sugar control goal HBA1C is 6.5

## 2011-01-08 ENCOUNTER — Ambulatory Visit (HOSPITAL_COMMUNITY)
Admission: RE | Admit: 2011-01-08 | Discharge: 2011-01-08 | Disposition: A | Discharge status: Home / Self Care | Payer: Medicare HMO | Source: Ambulatory Visit | Attending: Family Medicine | Admitting: Family Medicine

## 2011-01-08 DIAGNOSIS — Z1231 Encounter for screening mammogram for malignant neoplasm of breast: Secondary | ICD-10-CM | POA: Insufficient documentation

## 2011-01-08 DIAGNOSIS — Z139 Encounter for screening, unspecified: Secondary | ICD-10-CM

## 2011-03-02 ENCOUNTER — Encounter: Payer: Self-pay | Admitting: Family Medicine

## 2011-03-11 ENCOUNTER — Other Ambulatory Visit (HOSPITAL_COMMUNITY)
Admission: RE | Admit: 2011-03-11 | Discharge: 2011-03-11 | Disposition: A | Discharge status: Home / Self Care | Payer: Medicare HMO | Source: Ambulatory Visit | Attending: Family Medicine | Admitting: Family Medicine

## 2011-03-11 ENCOUNTER — Encounter: Payer: Self-pay | Admitting: Family Medicine

## 2011-03-11 ENCOUNTER — Ambulatory Visit (INDEPENDENT_AMBULATORY_CARE_PROVIDER_SITE_OTHER): Payer: Medicare HMO | Admitting: Family Medicine

## 2011-03-11 VITALS — BP 110/74 | HR 78 | Resp 16 | Ht 63.0 in | Wt 215.4 lb

## 2011-03-11 DIAGNOSIS — L0292 Furuncle, unspecified: Secondary | ICD-10-CM

## 2011-03-11 DIAGNOSIS — Z01419 Encounter for gynecological examination (general) (routine) without abnormal findings: Secondary | ICD-10-CM | POA: Insufficient documentation

## 2011-03-11 DIAGNOSIS — I10 Essential (primary) hypertension: Secondary | ICD-10-CM

## 2011-03-11 DIAGNOSIS — Z1211 Encounter for screening for malignant neoplasm of colon: Secondary | ICD-10-CM

## 2011-03-11 DIAGNOSIS — L0293 Carbuncle, unspecified: Secondary | ICD-10-CM

## 2011-03-11 DIAGNOSIS — Z0000 Encounter for general adult medical examination without abnormal findings: Secondary | ICD-10-CM

## 2011-03-11 DIAGNOSIS — E119 Type 2 diabetes mellitus without complications: Secondary | ICD-10-CM

## 2011-03-11 DIAGNOSIS — R5381 Other malaise: Secondary | ICD-10-CM

## 2011-03-11 DIAGNOSIS — J019 Acute sinusitis, unspecified: Secondary | ICD-10-CM

## 2011-03-11 LAB — HEMOCCULT GUIAC POC 1CARD (OFFICE)

## 2011-03-11 MED ORDER — METFORMIN HCL 1000 MG PO TABS: 1000.0000 mg | ORAL_TABLET | Freq: Two times a day (BID) | ORAL | Status: DC

## 2011-03-11 MED ORDER — CARISOPRODOL 350 MG PO TABS: 350.0000 mg | ORAL_TABLET | Freq: Every evening | ORAL | Status: DC | PRN

## 2011-03-11 MED ORDER — DOXYCYCLINE HYCLATE 100 MG PO TABS: 100.0000 mg | ORAL_TABLET | Freq: Two times a day (BID) | ORAL | Status: AC

## 2011-03-11 NOTE — Patient Instructions (Addendum)
F/U in end April HBA1C non fasting in January    Hba1C and chem 7 in end April non fasting  Medication is sent in for the nostril, if the problem continues please call and let me know so can refer you to ENT for further eval  You are referred for a colonscopy in 2013, due to fam h/o colon ca

## 2011-03-11 NOTE — Progress Notes (Signed)
  Subjective:    Patient ID: Shannon Romero, female    DOB: [birth year], 65 y.o.   MRN: [id number]  HPI The PT is here for annual exam  and review of any available recent lab and radiology data.  Preventive health is updated, specifically  Cancer screening and Immunization.   Questions or concerns regarding consultations or procedures which the PT has had in the interim are  addressed. The PT denies any adverse reactions to current medications since the last visit.  There are no new concerns.  C/o foul smell in right nostril x 1 to 2 months, sometimes bloody.  C/o right anterior breast discomfort intermittent x 1 month.  Father had colon ca at age 23, needs colonscopy      Review of Systems     Objective:   Physical Exam Pleasant obesefemale, alert and oriented x 3, in no cardio-pulmonary distress. Afebrile. HEENT No facial trauma or asymetry. Sinuses non tender.  EOMI, PERTL, fundoscopic exam is normal, no hemorhage or exudate.  External ears normal, tympanic membranes clear.No lesion visible in either nostril, will treat based on symptoms Oropharynx moist, no exudate,poor  dentition. Neck: supple, no adenopathy,JVD or thyromegaly.No bruits.  Chest: Clear to ascultation bilaterally.No crackles or wheezes. Non tender to palpation  Breast: No asymetry,no masses. No nipple discharge or inversion. No axillary or supraclavicular adenopathy  Cardiovascular system; Heart sounds normal,  S1 and  S2 ,no S3.  No murmur, or thrill. Apical beat not displaced Peripheral pulses normal.  Abdomen: Soft, non tender, no organomegaly or masses. No bruits. Bowel sounds normal. No guarding, tenderness or rebound.  Rectal:  No mass. Guaiac negative stool.  GU: External genitalia normal. No lesions. Vaginal canal normal.No discharge. Uterus normal size, no adnexal masses, no cervical motion or adnexal tenderness.  Musculoskeletal exam: Decreased  ROM of spine,ambulatess with a  cane, adequate in  hips , shoulders and knees. No deformity ,swelling or crepitus noted. No muscle wasting or atrophy.   Neurologic: Cranial nerves 2 to 12 intact. Power, tone , and reflexes normal throughout.   Skin: Intact, no ulceration, erythema , scaling or rash noted. Pigmentation normal throughout  Psych; Normal mood and affect. Judgement and concentration normal  Diabetic Foot Check:  Appearance - no lesions, ulcers or calluses Skin - no unusual pallor or redness Sensation - grossly intact to light touch Monofilament testing -  Right - Great toe, medial, central, lateral ball and posterior foot diminished Left - Great toe, medial, central, lateral ball and posterior footdiminished Pulses Left - Dorsalis Pedis and Posterior Tibia normal Right - Dorsalis Pedis and Posterior Tibia normal       Assessment & Plan:

## 2011-03-12 NOTE — Progress Notes (Signed)
Reminder is in epic for next tcs to be done 03/2013

## 2011-03-12 NOTE — Progress Notes (Unsigned)
Called pt. She said she is not having any GI problems. She just thought it might be time for her next colonoscopy. Per Dr. Luvenia Starch opt note her last one was on 03/26/2008 and he said her next one would be due in 5 years, making that 03/2013. He was aware of her family hx of colon cancer. Will route to [facility] and confirm pt is on recall list for 03/2013.

## 2011-03-12 NOTE — Progress Notes (Unsigned)
Agreed. She is not due for colonoscopy until December 2014 and less she is having any GI problems. If so, she should call us sooner.

## 2011-03-20 ENCOUNTER — Encounter: Payer: Self-pay | Admitting: Family Medicine

## 2011-03-22 DIAGNOSIS — L0292 Furuncle, unspecified: Secondary | ICD-10-CM | POA: Insufficient documentation

## 2011-03-22 NOTE — Assessment & Plan Note (Signed)
Controlled, no change in medication  

## 2011-03-22 NOTE — Assessment & Plan Note (Signed)
C/o foul smell in nostril, antibiotic prescribed based on symptom only, no visible lesion. ENT f/u needed if persists

## 2011-07-17 ENCOUNTER — Telehealth: Payer: Self-pay | Admitting: Family Medicine

## 2011-07-17 MED ORDER — LISINOPRIL-HYDROCHLOROTHIAZIDE 20-25 MG PO TABS: 1.0000 | ORAL_TABLET | Freq: Every day | ORAL | Status: DC

## 2011-07-17 MED ORDER — MELOXICAM 15 MG PO TABS: 15.0000 mg | ORAL_TABLET | Freq: Every day | ORAL | Status: DC

## 2011-07-17 NOTE — Telephone Encounter (Signed)
Ok to refill. Last visit was in Nov

## 2011-07-17 NOTE — Telephone Encounter (Signed)
Okay to refill, she was told to f/u at the end of April

## 2011-07-17 NOTE — Telephone Encounter (Signed)
Filled per dr Jeanice Lim

## 2011-08-10 ENCOUNTER — Encounter: Payer: Self-pay | Admitting: Family Medicine

## 2011-08-10 ENCOUNTER — Ambulatory Visit (INDEPENDENT_AMBULATORY_CARE_PROVIDER_SITE_OTHER): Payer: Medicare HMO | Admitting: Family Medicine

## 2011-08-10 VITALS — BP 120/74 | HR 83 | Resp 16 | Ht 63.0 in | Wt 215.4 lb

## 2011-08-10 DIAGNOSIS — Z79899 Other long term (current) drug therapy: Secondary | ICD-10-CM

## 2011-08-10 DIAGNOSIS — I10 Essential (primary) hypertension: Secondary | ICD-10-CM

## 2011-08-10 DIAGNOSIS — R5383 Other fatigue: Secondary | ICD-10-CM

## 2011-08-10 DIAGNOSIS — R5381 Other malaise: Secondary | ICD-10-CM

## 2011-08-10 DIAGNOSIS — E119 Type 2 diabetes mellitus without complications: Secondary | ICD-10-CM

## 2011-08-10 DIAGNOSIS — M479 Spondylosis, unspecified: Secondary | ICD-10-CM

## 2011-08-10 DIAGNOSIS — E669 Obesity, unspecified: Secondary | ICD-10-CM

## 2011-08-10 DIAGNOSIS — M549 Dorsalgia, unspecified: Secondary | ICD-10-CM

## 2011-08-10 MED ORDER — KETOROLAC TROMETHAMINE 60 MG/2ML IM SOLN
60.0000 mg | Freq: Once | INTRAMUSCULAR | Status: AC
  Administered 2011-08-10: 60 mg via INTRAMUSCULAR

## 2011-08-10 MED ORDER — PREDNISONE (PAK) 5 MG PO TABS: 5.0000 mg | ORAL_TABLET | ORAL | Status: DC

## 2011-08-10 MED ORDER — METHYLPREDNISOLONE ACETATE 80 MG/ML IJ SUSP
80.0000 mg | Freq: Once | INTRAMUSCULAR | Status: AC
  Administered 2011-08-10: 80 mg via INTRAMUSCULAR

## 2011-08-10 NOTE — Progress Notes (Signed)
  Subjective:    Patient ID: Shannon Romero, female    DOB: 1946/02/29, 66 y.o.   MRN: [id number]  HPI 4 month h/o uncontrolled LBP radiating to buttocks and posterior thigh to back of knees, rated at a 10, no incontinence, no weakness, no sensory loss The PT is here for follow up and re-evaluation of chronic medical conditions, medication management and review of any available recent lab and radiology data.  Preventive health is updated, specifically  Cancer screening and Immunization.   Questions or concerns regarding consultations or procedures which the PT has had in the interim are  addressed. The PT denies any adverse reactions to current medications since the last visit.  Denies polyuria, polydipsia or blurred vision.no hypoglycemic episodes. Needs testing supplies       Review of Systems See HPI Denies recent fever or chills. Denies sinus pressure, nasal congestion, ear pain or sore throat. Denies chest congestion, productive cough or wheezing. Denies chest pains, palpitations and leg swelling Denies abdominal pain, nausea, vomiting,diarrhea or constipation.   Denies dysuria, frequency, hesitancy or incontinence.  Denies headaches, seizures, numbness, or tingling. Denies depression, anxiety or insomnia. Denies skin break down or rash.        Objective:   Physical Exam Patient alert and oriented and in no cardiopulmonary distress.  HEENT: No facial asymmetry, EOMI, no sinus tenderness,  oropharynx pink and moist.  Neck supple no adenopathy.  Chest: Clear to auscultation bilaterally.  CVS: S1, S2 no murmurs, no S3.  ABD: Soft non tender. Bowel sounds normal.  Ext: No edema  WU:[id number]   ROM spine,adequate in  shoulders, hips and knees. Skin: Intact, no ulcerations or rash noted.  Psych: Good eye contact, normal affect. Memory intact not anxious or depressed appearing.  CNS: CN 2-12 intact, power, tone and sensation normal throughout.  Diabetic Foot Check:    Appearance - no lesions, ulcers or calluses Skin - no unusual pallor or redness Sensation - grossly intact to light touch Monofilament testing -  Right - Great toe, medial, central, lateral ball and posterior foot intact Left - Great toe, medial, central, lateral ball and posterior foot intact Pulses Left - Dorsalis Pedis and Posterior Tibia normal Right - Dorsalis Pedis and Posterior Tibia normal       Assessment & Plan:

## 2011-08-10 NOTE — Assessment & Plan Note (Signed)
Worsening and uncontrolled pain x 4 months, will need epidural if conservative management through office fails

## 2011-08-10 NOTE — Assessment & Plan Note (Signed)
Controlled when last checked, script for testing supplies written

## 2011-08-10 NOTE — Assessment & Plan Note (Signed)
Controlled, no change in medication  

## 2011-08-10 NOTE — Assessment & Plan Note (Signed)
Unchanged. Patient re-educated about  the importance of commitment to a  minimum of 150 minutes of exercise per week. The importance of healthy food choices with portion control discussed. Encouraged to start a food diary, count calories and to consider  joining a support group. Sample diet sheets offered. Goals set by the patient for the next several months.    

## 2011-08-10 NOTE — Patient Instructions (Addendum)
F/u in early October.  Toradol 60mg  iM and depo medrol 80mg  IM in office today for back pain and prednissoner dose pack sent to [facility]. Call fro referral for epidural injection, referral to pain clinic if no significant relief please.  HBA1C END MAY, you already have that form  Fasting lipid, chem 7 and EGFR, HBA1C, cbc and microalb September 26 or after. It is important that you exercise regularly at least 30 minutes 5 times a week. If you develop chest pain, have severe difficulty breathing, or feel very tired, stop exercising immediately and seek medical attention   you will get a script for meter and once daily testing supplies

## 2011-08-28 ENCOUNTER — Other Ambulatory Visit: Payer: Self-pay | Admitting: Family Medicine

## 2011-10-21 ENCOUNTER — Telehealth: Payer: Self-pay | Admitting: Family Medicine

## 2011-10-21 MED ORDER — LISINOPRIL-HYDROCHLOROTHIAZIDE 20-25 MG PO TABS: 1.0000 | ORAL_TABLET | Freq: Every day | ORAL | Status: DC

## 2011-10-21 MED ORDER — MELOXICAM 15 MG PO TABS: 15.0000 mg | ORAL_TABLET | Freq: Every day | ORAL | Status: DC

## 2011-10-21 NOTE — Telephone Encounter (Signed)
Patient aware, meds sent in  

## 2011-11-03 ENCOUNTER — Other Ambulatory Visit: Payer: Self-pay | Admitting: *Deleted

## 2011-11-03 MED ORDER — MELOXICAM 15 MG PO TABS: 15.0000 mg | ORAL_TABLET | Freq: Every day | ORAL | Status: DC

## 2011-11-03 MED ORDER — LISINOPRIL-HYDROCHLOROTHIAZIDE 20-25 MG PO TABS: 1.0000 | ORAL_TABLET | Freq: Every day | ORAL | Status: DC

## 2011-12-15 ENCOUNTER — Other Ambulatory Visit: Payer: Self-pay | Admitting: Family Medicine

## 2012-01-08 ENCOUNTER — Other Ambulatory Visit: Payer: Self-pay | Admitting: Family Medicine

## 2012-01-09 LAB — CBC WITH DIFFERENTIAL/PLATELET
Basophils Absolute: 0 10*3/uL (ref 0.0–0.1)
Basophils Relative: 0 % (ref 0–1)
Eosinophils Absolute: 0.2 10*3/uL (ref 0.0–0.7)
Lymphs Abs: 5.6 10*3/uL — ABNORMAL HIGH (ref 0.7–4.0)
MCH: 26.8 pg (ref 26.0–34.0)
MCHC: 32.6 g/dL (ref 30.0–36.0)
Neutrophils Relative %: 38 % — ABNORMAL LOW (ref 43–77)
Platelets: 312 10*3/uL (ref 150–400)
RBC: 4.52 MIL/uL (ref 3.87–5.11)
RDW: 16.7 % — ABNORMAL HIGH (ref 11.5–15.5)

## 2012-01-09 LAB — HEMOGLOBIN A1C
Hgb A1c MFr Bld: 6.6 % — ABNORMAL HIGH (ref <5.7)
Mean Plasma Glucose: 143 mg/dL — ABNORMAL HIGH (ref <117)

## 2012-01-09 LAB — LIPID PANEL: Cholesterol: 136 mg/dL (ref 0–200)

## 2012-01-09 LAB — MICROALBUMIN / CREATININE URINE RATIO
Creatinine, Urine: 226.3 mg/dL
Microalb, Ur: 0.76 mg/dL (ref 0.00–1.89)

## 2012-01-19 ENCOUNTER — Other Ambulatory Visit: Payer: Self-pay | Admitting: Family Medicine

## 2012-01-27 ENCOUNTER — Other Ambulatory Visit: Payer: Self-pay | Admitting: Family Medicine

## 2012-01-30 ENCOUNTER — Other Ambulatory Visit: Payer: Self-pay | Admitting: Family Medicine

## 2012-02-01 ENCOUNTER — Other Ambulatory Visit: Payer: Self-pay

## 2012-02-01 MED ORDER — LISINOPRIL-HYDROCHLOROTHIAZIDE 20-25 MG PO TABS: 1.0000 | ORAL_TABLET | Freq: Every day | ORAL | Status: DC

## 2012-02-01 MED ORDER — MELOXICAM 15 MG PO TABS: 15.0000 mg | ORAL_TABLET | Freq: Every day | ORAL | Status: DC

## 2012-02-10 ENCOUNTER — Ambulatory Visit (INDEPENDENT_AMBULATORY_CARE_PROVIDER_SITE_OTHER): Payer: Medicare HMO | Admitting: Family Medicine

## 2012-02-10 ENCOUNTER — Encounter: Payer: Self-pay | Admitting: Family Medicine

## 2012-02-10 VITALS — BP 130/72 | HR 70 | Resp 18 | Ht 63.0 in | Wt 206.1 lb

## 2012-02-10 DIAGNOSIS — M549 Dorsalgia, unspecified: Secondary | ICD-10-CM

## 2012-02-10 DIAGNOSIS — E669 Obesity, unspecified: Secondary | ICD-10-CM

## 2012-02-10 DIAGNOSIS — I10 Essential (primary) hypertension: Secondary | ICD-10-CM

## 2012-02-10 DIAGNOSIS — R0789 Other chest pain: Secondary | ICD-10-CM

## 2012-02-10 DIAGNOSIS — F411 Generalized anxiety disorder: Secondary | ICD-10-CM

## 2012-02-10 DIAGNOSIS — E119 Type 2 diabetes mellitus without complications: Secondary | ICD-10-CM

## 2012-02-10 MED ORDER — GABAPENTIN 100 MG PO CAPS: ORAL_CAPSULE | ORAL | Status: DC

## 2012-02-10 MED ORDER — IBUPROFEN 800 MG PO TABS: 800.0000 mg | ORAL_TABLET | Freq: Three times a day (TID) | ORAL | Status: DC | PRN

## 2012-02-10 MED ORDER — KETOROLAC TROMETHAMINE 60 MG/2ML IJ SOLN
60.0000 mg | Freq: Once | INTRAMUSCULAR | Status: AC
  Administered 2012-02-10: 60 mg via INTRAMUSCULAR

## 2012-02-10 MED ORDER — PREDNISONE (PAK) 5 MG PO TABS: 5.0000 mg | ORAL_TABLET | ORAL | Status: DC

## 2012-02-10 MED ORDER — METHYLPREDNISOLONE ACETATE 80 MG/ML IJ SUSP
80.0000 mg | Freq: Once | INTRAMUSCULAR | Status: AC
  Administered 2012-02-10: 80 mg via INTRAMUSCULAR

## 2012-02-10 NOTE — Patient Instructions (Addendum)
Annual wellness in 4 month  Toradol 60mg  Im and depo medrol 80 mg IM for back pain and prednisone dose pack, also start gabapentin at bedtime, one cap for 1 week, increase to two capsules in week 2 if needed, maximum of 3 capsules at night if needed for nerve pain worse on right lower ext  Call for referral to pain specialist if needed for epidural, you have bulging disc in back  EKG for new chest pain  Sorry about the death of your daughter in law  HBA1C in 4 month, and chem 7 non fasting

## 2012-02-10 NOTE — Progress Notes (Signed)
  Subjective:    Patient ID: Shannon Romero, female    DOB: [birth year], 66 y.o.   MRN: [id number]  HPI The PT is here for follow up and re-evaluation of chronic medical conditions, medication management and review of any available recent lab and radiology data.  Preventive health is updated, specifically  Cancer screening and Immunization.   Shannon Romero has significant emotional stress due to recent passing of her 39 year old daughter in law from breast ca, and she is now also helping with her grandchildren. Also has been experiencing increased back and lower extremity pain , with tingling and numbness, no specific trigger noted, has established disc disease 2 week h/o intermittent left chest pain, non radiaiting, at times associated with activity, accompanied by fatigue    Review of Systems See HPI Denies recent fever or chills. Denies sinus pressure, nasal congestion, ear pain or sore throat. Denies chest congestion, productive cough or wheezing. Denies PND, orthopnea  palpitations and leg swelling Denies abdominal pain, nausea, vomiting,diarrhea or constipation.   Denies dysuria, frequency, hesitancy or incontinence.  Denies headaches, seizures, numbness, or tingling.  Denies skin break down or rash.        Objective:   Physical Exam Patient alert and oriented and in no cardiopulmonary distress.Pt in pain  HEENT: No facial asymmetry, EOMI, no sinus tenderness,  oropharynx pink and moist.  Neck supple no adenopathy.  Chest: Clear to auscultation bilaterally.No reproducible chest wall tenderness EKG: sinus rythym with possible ischemia in the past  CVS: S1, S2 no murmurs, no S3.  ABD: Soft non tender. Bowel sounds normal.  Ext: No edema  IR:[id number]  ROM spine,adequate in  shoulders, hips and knees.  Skin: Intact, no ulcerations or rash noted.  Psych: Good eye contact, normal affect. Memory intact not anxious mildly  depressed appearing.  CNS: CN 2-12 intact, power,  tone and sensation normal throughout.        Assessment & Plan:

## 2012-02-11 MED ORDER — CARISOPRODOL 350 MG PO TABS: 350.0000 mg | ORAL_TABLET | Freq: Every evening | ORAL | Status: DC | PRN

## 2012-02-15 NOTE — Assessment & Plan Note (Signed)
Increased in the last several weeks due to multiple illnesses in the family, and recent death of her daughter in law in her 72's from cancer

## 2012-02-15 NOTE — Assessment & Plan Note (Signed)
New onset chest pain, at times associated with fatigue, abnormal EKG in office in diabetic pt, cardiology eval warranted, will refer

## 2012-02-15 NOTE — Assessment & Plan Note (Signed)
Improved. Pt applauded on succesful weight loss through lifestyle change, and encouraged to continue same. Weight loss goal set for the next several months.  

## 2012-02-15 NOTE — Assessment & Plan Note (Signed)
Controlled, no change in medication DASH diet and commitment to daily physical activity for a minimum of 30 minutes discussed and encouraged, as a part of hypertension management. The importance of attaining a healthy weight is also discussed.  

## 2012-02-15 NOTE — Assessment & Plan Note (Signed)
Controlled, no change in medication Patient advised to reduce carb and sweets, commit to regular physical activity, take meds as prescribed, test blood as directed, and attempt to lose weight, to improve blood sugar control.  

## 2012-02-15 NOTE — Assessment & Plan Note (Signed)
Increased flare, has established disc disease, lower ext numbness and weakness also, has ahd epidurals in the past, wants to start with in house management, will also add gabapentin

## 2012-02-16 ENCOUNTER — Encounter: Payer: Self-pay | Admitting: Family Medicine

## 2012-02-18 ENCOUNTER — Encounter: Payer: Self-pay | Admitting: Cardiovascular Disease

## 2012-02-18 ENCOUNTER — Ambulatory Visit (INDEPENDENT_AMBULATORY_CARE_PROVIDER_SITE_OTHER): Payer: Medicare HMO | Admitting: Cardiovascular Disease

## 2012-02-18 ENCOUNTER — Encounter: Payer: Self-pay | Admitting: *Deleted

## 2012-02-18 VITALS — BP 136/70 | HR 72 | Ht 64.0 in | Wt 206.1 lb

## 2012-02-18 DIAGNOSIS — I10 Essential (primary) hypertension: Secondary | ICD-10-CM

## 2012-02-18 DIAGNOSIS — R0789 Other chest pain: Secondary | ICD-10-CM

## 2012-02-18 DIAGNOSIS — E119 Type 2 diabetes mellitus without complications: Secondary | ICD-10-CM

## 2012-02-18 NOTE — Addendum Note (Signed)
Addended by: Reather Laurence A on: 02/18/2012 11:21 AM   Modules accepted: Orders

## 2012-02-18 NOTE — Progress Notes (Signed)
Patient ID: Shannon Romero, female   DOB: [birth year], 66 y.o.   MRN: [id number] 66 yo obese female with HTN and diabetes.  Stress lately from daughter in law passing from breast cancer. Trying to help her son care for two grandchildren.  During emotional stress of illness has had SSCP no radiation no dyspnea or palpitations.  Last minutes. Ambulation limited by back and nerve problems so cannot ambulate far.  No history of CAD  Does not watch diet well with DM. A1c 6.6 on 9/27   Compliant with meds. No rest pain. Pain relieved when emotions get better.    ROS: Denies fever, malais, weight loss, blurry vision, decreased visual acuity, cough, sputum, SOB, hemoptysis, pleuritic pain, palpitaitons, heartburn, abdominal pain, melena, lower extremity edema, claudication, or rash.  All other systems reviewed and negative   General: Affect appropriate Obese black female HEENT: normal Neck supple with no adenopathy JVP normal no bruits no thyromegaly Lungs clear with no wheezing and good diaphragmatic motion Heart:  S1/S2 no murmur,rub, gallop or click PMI normal Abdomen: benighn, BS positve, no tenderness, no AAA no bruit.  No HSM or HJR Distal pulses intact with no bruits No edema Neuro non-focal Skin warm and dry No muscular weakness  Medications Current Outpatient Prescriptions  Medication Sig Dispense Refill  . Ascorbic Acid (VITAMIN C) 100 MG tablet Take 100 mg by mouth daily.      Marland Kitchen aspirin 81 MG tablet Take 81 mg by mouth daily.        . Calcium Carb-Cholecalciferol (CALCIUM + D3) 600-200 MG-UNIT TABS Take 2 tablets by mouth daily.      . carisoprodol (SOMA) 350 MG tablet Take 1 tablet (350 mg total) by mouth at bedtime as needed.  30 tablet  4  . Cholecalciferol (D3-1000 PO) Take 1 capsule by mouth daily.        . Cimetidine (ACID REDUCER PO) Take 1 tablet by mouth 2 (two) times daily.        . Cyanocobalamin (B-12) 500 MCG TABS Take 1 tablet by mouth daily.        . fish oil-omega-3  fatty acids 1000 MG capsule Take 1 g by mouth daily.        Marland Kitchen gabapentin (NEURONTIN) 100 MG capsule Three capsules at bedtime for pain.  90 capsule  3  . ibuprofen (ADVIL,MOTRIN) 800 MG tablet Take 1 tablet (800 mg total) by mouth every 8 (eight) hours as needed for pain.  30 tablet  0  . lisinopril-hydrochlorothiazide (PRINZIDE,ZESTORETIC) 20-25 MG per tablet Take 1 tablet by mouth daily.  30 tablet  3  . meloxicam (MOBIC) 15 MG tablet Take 1 tablet (15 mg total) by mouth daily.  30 tablet  3  . metFORMIN (GLUCOPHAGE) 1000 MG tablet TAKE ONE TABLET BY MOUTH TWICE DAILY WITH MEALS  60 tablet  2  . Multiple Vitamin (MULTIVITAMIN) capsule Take 1 capsule by mouth daily.      . predniSONE (STERAPRED UNI-PAK) 5 MG TABS Take 1 tablet (5 mg total) by mouth as directed.  21 tablet  0  . pyridoxine (B-6) 100 MG tablet TWO TABS BY MOUTH DAILY        Current Facility-Administered Medications  Medication Dose Route Frequency Provider Last Rate Last Dose  . Influenza (>/= 3 years) inactive virus vaccine (FLVIRIN/FLUZONE) injection SUSP 0.5 mL  0.5 mL Intramuscular Once Kerri Perches, MD        Allergies Review of patient's allergies indicates no known  allergies.  Family History: Family History  Problem Relation Age of Onset  . Diabetes Mother   . Heart disease Mother   . Cancer Father     COLON  . Diabetes Sister     X 5  . Heart disease Sister     X 2  . Hypertension Sister     X 7  . Diabetes Brother     X 1  . Cancer Brother     LUNG BRAIN AND THROAT    Social History: History   Social History  . Marital Status: Married    Spouse Name: N/A    Number of Children: N/A  . Years of Education: N/A   Occupational History  . Not on file.   Social History Main Topics  . Smoking status: Former Games developer  . Smokeless tobacco: Not on file  . Alcohol Use: No  . Drug Use: No  . Sexually Active: Not on file   Other Topics Concern  . Not on file   Social History Narrative  . No  narrative on file    Electrocardiogram:  02/10/12  SR poor R wave progression cannot r/o anterior MI  Assessment and Plan

## 2012-02-18 NOTE — Assessment & Plan Note (Signed)
Stressed importance of low carb diet  A1c slightly higher than goal

## 2012-02-18 NOTE — Assessment & Plan Note (Signed)
CRF;s of HTN and diabetes ECG with poor R wave progression from body habitus Unable to walk on treadmill due to back issues F/U lexiscan myovue

## 2012-02-18 NOTE — Patient Instructions (Signed)
Your physician recommends that you schedule a follow-up appointment in: As needed  Your physician has requested that you have a lexiscan myoview. For further information please visit [email or web address]. Please follow instruction sheet, as given.   

## 2012-02-18 NOTE — Assessment & Plan Note (Signed)
Well controlled.  Continue current medications and low sodium Dash type diet.    

## 2012-02-24 ENCOUNTER — Other Ambulatory Visit: Payer: Self-pay | Admitting: Family Medicine

## 2012-02-24 DIAGNOSIS — Z139 Encounter for screening, unspecified: Secondary | ICD-10-CM

## 2012-02-26 ENCOUNTER — Encounter (HOSPITAL_COMMUNITY): Payer: Self-pay

## 2012-02-26 ENCOUNTER — Encounter (HOSPITAL_COMMUNITY)
Admission: RE | Admit: 2012-02-26 | Discharge: 2012-02-26 | Disposition: A | Discharge status: Home / Self Care | Payer: Medicare HMO | Source: Ambulatory Visit | Attending: Cardiovascular Disease | Admitting: Cardiovascular Disease

## 2012-02-26 ENCOUNTER — Encounter (HOSPITAL_COMMUNITY): Payer: Self-pay | Admitting: Cardiology

## 2012-02-26 ENCOUNTER — Ambulatory Visit (HOSPITAL_COMMUNITY)
Admission: RE | Admit: 2012-02-26 | Discharge: 2012-02-26 | Disposition: A | Discharge status: Home / Self Care | Payer: Medicare HMO | Source: Ambulatory Visit | Attending: Cardiovascular Disease | Admitting: Cardiovascular Disease

## 2012-02-26 DIAGNOSIS — I10 Essential (primary) hypertension: Secondary | ICD-10-CM | POA: Insufficient documentation

## 2012-02-26 DIAGNOSIS — E669 Obesity, unspecified: Secondary | ICD-10-CM | POA: Insufficient documentation

## 2012-02-26 DIAGNOSIS — E119 Type 2 diabetes mellitus without complications: Secondary | ICD-10-CM | POA: Insufficient documentation

## 2012-02-26 DIAGNOSIS — R079 Chest pain, unspecified: Secondary | ICD-10-CM | POA: Insufficient documentation

## 2012-02-26 DIAGNOSIS — R0789 Other chest pain: Secondary | ICD-10-CM | POA: Insufficient documentation

## 2012-02-26 MED ORDER — REGADENOSON 0.4 MG/5ML IV SOLN
INTRAVENOUS | Status: AC
  Administered 2012-02-26: 0.4 mg via INTRAVENOUS
  Filled 2012-02-26: qty 5

## 2012-02-26 MED ORDER — SODIUM CHLORIDE 0.9 % IJ SOLN
INTRAMUSCULAR | Status: AC
  Administered 2012-02-26: 10 mL via INTRAVENOUS
  Filled 2012-02-26: qty 10

## 2012-02-26 MED ORDER — TECHNETIUM TC 99M SESTAMIBI - CARDIOLITE
10.0000 | Freq: Once | INTRAVENOUS | Status: AC | PRN
  Administered 2012-02-26: 10 via INTRAVENOUS

## 2012-02-26 MED ORDER — TECHNETIUM TC 99M SESTAMIBI - CARDIOLITE
30.0000 | Freq: Once | INTRAVENOUS | Status: AC | PRN
  Administered 2012-02-26: 30 via INTRAVENOUS

## 2012-02-26 NOTE — Progress Notes (Signed)
Stress Lab Nurses Notes - Shannon Romero 02/26/2012 Reason for doing test: Chest Pain Type of test: Marlane Hatcher Nurse performing test: Parke Poisson, RN Nuclear Medicine Tech: Lou Cal Echo Tech: Not Applicable MD performing test: Ival Bible & Joni Reining NP Family MD: Lodema Hong Test explained and consent signed: yes IV started: 22g jelco, Saline lock flushed, No redness or edema and Saline lock started in radiology Symptoms: None Treatment/Intervention: None Reason test stopped: protocol completed After recovery IV was: Discontinued via X-ray tech and No redness or edema Patient to return to Nuc. Med at : 11:30 Patient discharged: Home Patient's Condition upon discharge was: stable Comments: During test BP 112/58 & HR 104.  Recovery BP 120/62 & HR 84.  Symptoms resolved in recovery. Erskine Speed T

## 2012-03-01 ENCOUNTER — Ambulatory Visit (HOSPITAL_COMMUNITY): Payer: Medicare HMO

## 2012-03-08 ENCOUNTER — Ambulatory Visit (HOSPITAL_COMMUNITY)
Admission: RE | Admit: 2012-03-08 | Discharge: 2012-03-08 | Disposition: A | Discharge status: Home / Self Care | Payer: Medicare HMO | Source: Ambulatory Visit | Attending: Family Medicine | Admitting: Family Medicine

## 2012-03-08 ENCOUNTER — Encounter: Payer: Self-pay | Admitting: Family Medicine

## 2012-03-08 DIAGNOSIS — Z139 Encounter for screening, unspecified: Secondary | ICD-10-CM

## 2012-03-08 DIAGNOSIS — Z1231 Encounter for screening mammogram for malignant neoplasm of breast: Secondary | ICD-10-CM | POA: Insufficient documentation

## 2012-03-28 ENCOUNTER — Other Ambulatory Visit: Payer: Self-pay | Admitting: Family Medicine

## 2012-04-07 ENCOUNTER — Encounter: Payer: Self-pay | Admitting: Family Medicine

## 2012-04-07 ENCOUNTER — Ambulatory Visit (INDEPENDENT_AMBULATORY_CARE_PROVIDER_SITE_OTHER): Payer: Medicare HMO | Admitting: Family Medicine

## 2012-04-07 VITALS — BP 128/72 | HR 68 | Resp 18 | Ht 63.0 in | Wt 211.1 lb

## 2012-04-07 DIAGNOSIS — I10 Essential (primary) hypertension: Secondary | ICD-10-CM

## 2012-04-07 DIAGNOSIS — E119 Type 2 diabetes mellitus without complications: Secondary | ICD-10-CM

## 2012-04-07 DIAGNOSIS — M479 Spondylosis, unspecified: Secondary | ICD-10-CM

## 2012-04-07 MED ORDER — TRAMADOL HCL 50 MG PO TABS: ORAL_TABLET | ORAL | Status: AC

## 2012-04-07 NOTE — Assessment & Plan Note (Signed)
Controlled, no change in medication Patient advised to reduce carb and sweets, commit to regular physical activity, take meds as prescribed, test blood as directed, and attempt to lose weight, to improve blood sugar control.  

## 2012-04-07 NOTE — Assessment & Plan Note (Signed)
Controlled, no change in medication DASH diet and commitment to daily physical activity for a minimum of 30 minutes discussed and encouraged, as a part of hypertension management. The importance of attaining a healthy weight is also discussed.  

## 2012-04-07 NOTE — Patient Instructions (Addendum)
F/u as before.  New medication, tramadol sent in for as needed use, for uncontrolled back opain.  You are referred for epidural injections, and will be contacted, I hope that these help

## 2012-04-07 NOTE — Assessment & Plan Note (Signed)
Uncontrolled, stop ibuprofen, already taking meloxicam, add tramadol and refer for epidural

## 2012-04-07 NOTE — Progress Notes (Signed)
  Subjective:    Patient ID: Shannon Romero, female    DOB: 11/02/1945, 66 y.o.   MRN: [id number]  HPI Pt in for uncontrolled back pain. States it is 10 plus, wants epidural injections which she has had in the past and benefited somewhat, ambulates withj a cane Pain radiaites down both buttocks to knees and at times as far as posterior calf. Denies lower extremity weakness, numbness or incontinence. Recent nasal congestion with clear drainage after getting wet, no fever , chills sore throat or productive cough   Review of Systems See HPI Denies chest pains, palpitations and leg swelling Denies abdominal pain, nausea, vomiting,diarrhea or constipation.   Denies dysuria, frequency, hesitancy or incontinence.  Denies headaches, seizures, numbness, or tingling. Denies depression, anxiety or insomnia. Denies skin break down or rash.        Objective:   Physical Exam  Patient alert and oriented and in no cardiopulmonary distress.Pt in pain  HEENT: No facial asymmetry, EOMI, no sinus tenderness,  oropharynx pink and moist.  Neck supple no adenopathy.  Chest: Clear to auscultation bilaterally.  CVS: S1, S2 no murmurs, no S3.  ABD: Soft non tender. Bowel sounds normal.  Ext: No edema  MS: decreased ROM spine,adequate in  shoulders, hips and knees.  Skin: Intact, no ulcerations or rash noted.  Psych: Good eye contact, normal affect. Memory intact not anxious or depressed appearing.  CNS: CN 2-12 intact, power, tone and sensation normal throughout.       Assessment & Plan:

## 2012-04-27 ENCOUNTER — Emergency Department (HOSPITAL_COMMUNITY)
Admission: EM | Admit: 2012-04-27 | Discharge: 2012-04-27 | Disposition: A | Discharge status: Home / Self Care | Payer: No Typology Code available for payment source | Attending: Emergency Medicine | Admitting: Emergency Medicine

## 2012-04-27 ENCOUNTER — Encounter (HOSPITAL_COMMUNITY): Payer: Self-pay

## 2012-04-27 DIAGNOSIS — E119 Type 2 diabetes mellitus without complications: Secondary | ICD-10-CM | POA: Insufficient documentation

## 2012-04-27 DIAGNOSIS — I10 Essential (primary) hypertension: Secondary | ICD-10-CM | POA: Insufficient documentation

## 2012-04-27 DIAGNOSIS — IMO0002 Reserved for concepts with insufficient information to code with codable children: Secondary | ICD-10-CM | POA: Insufficient documentation

## 2012-04-27 DIAGNOSIS — Z79899 Other long term (current) drug therapy: Secondary | ICD-10-CM | POA: Insufficient documentation

## 2012-04-27 DIAGNOSIS — Y939 Activity, unspecified: Secondary | ICD-10-CM | POA: Insufficient documentation

## 2012-04-27 DIAGNOSIS — Z7982 Long term (current) use of aspirin: Secondary | ICD-10-CM | POA: Insufficient documentation

## 2012-04-27 DIAGNOSIS — Y92410 Unspecified street and highway as the place of occurrence of the external cause: Secondary | ICD-10-CM | POA: Insufficient documentation

## 2012-04-27 DIAGNOSIS — Z8719 Personal history of other diseases of the digestive system: Secondary | ICD-10-CM | POA: Insufficient documentation

## 2012-04-27 DIAGNOSIS — Z87891 Personal history of nicotine dependence: Secondary | ICD-10-CM | POA: Insufficient documentation

## 2012-04-27 MED ORDER — MELOXICAM 15 MG PO TABS: 15.0000 mg | ORAL_TABLET | Freq: Every day | ORAL | Status: DC

## 2012-04-27 MED ORDER — HYDROCODONE-ACETAMINOPHEN 5-325 MG PO TABS: 0.5000 | ORAL_TABLET | Freq: Four times a day (QID) | ORAL | Status: DC | PRN
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
## 2012-05-01 NOTE — ED Provider Notes (Signed)
Medical screening examination/treatment/procedure(s) were performed by non-physician practitioner and as supervising physician I was immediately available for consultation/collaboration.   Shelda Jakes, MD 05/01/12 [phone]

## 2012-06-02 ENCOUNTER — Other Ambulatory Visit: Payer: Self-pay | Admitting: Family Medicine

## 2012-06-08 LAB — BASIC METABOLIC PANEL
Calcium: 10.1 mg/dL (ref 8.4–10.5)
Potassium: 4.6 mEq/L (ref 3.5–5.3)
Sodium: 139 mEq/L (ref 135–145)

## 2012-06-08 LAB — HEMOGLOBIN A1C
Hgb A1c MFr Bld: 6.8 % — ABNORMAL HIGH (ref <5.7)
Mean Plasma Glucose: 148 mg/dL — ABNORMAL HIGH (ref <117)

## 2012-06-15 ENCOUNTER — Ambulatory Visit (INDEPENDENT_AMBULATORY_CARE_PROVIDER_SITE_OTHER): Payer: PRIVATE HEALTH INSURANCE | Admitting: Family Medicine

## 2012-06-15 ENCOUNTER — Encounter: Payer: Self-pay | Admitting: Family Medicine

## 2012-06-15 VITALS — BP 110/70 | HR 82 | Resp 18 | Ht 63.0 in | Wt 213.1 lb

## 2012-06-15 DIAGNOSIS — I10 Essential (primary) hypertension: Secondary | ICD-10-CM

## 2012-06-15 DIAGNOSIS — E119 Type 2 diabetes mellitus without complications: Secondary | ICD-10-CM

## 2012-06-15 DIAGNOSIS — E669 Obesity, unspecified: Secondary | ICD-10-CM

## 2012-06-15 MED ORDER — TIZANIDINE HCL 4 MG PO TABS: ORAL_TABLET | ORAL | Status: DC

## 2012-06-15 NOTE — Assessment & Plan Note (Signed)
improved with injections pt to continue same. Change in muscle relaxant due to formulary coverage

## 2012-06-15 NOTE — Patient Instructions (Addendum)
F/u in end September, call if you need me before.  Labwork and blood pressure are excellent.  Please work on 5 to 8 pound weight loss in the next several month  Please examine your feet every day   Pneumonia vaccine today.  Non fasting labs end September HBa1C, TSH, Vit D , chem 7,and EGFR cBC  An alternative muscle relaxant wis prescribed for back spasm, zanaflex

## 2012-06-15 NOTE — Progress Notes (Signed)
  Subjective:    Patient ID: Shannon Romero, female    DOB: Jul 31, 1945, 67 y.o.   MRN: [id number]  HPI The PT is here for follow up and re-evaluation of chronic medical conditions, medication management and review of any available recent lab and radiology data.  Preventive health is updated, specifically  Cancer screening and Immunization.   Questions or concerns regarding consultations or procedures which the PT has had in the interim are  addressed. The PT denies any adverse reactions to current medications since the last visit.  There are no new concerns.  There are no specific complaints       Review of Systems See HPI Denies recent fever or chills. Denies sinus pressure, nasal congestion, ear pain or sore throat. Denies chest congestion, productive cough or wheezing. Denies chest pains, palpitations and leg swelling Denies abdominal pain, nausea, vomiting,diarrhea or constipation.   Denies dysuria, frequency, hesitancy or incontinence. Denies joint pain, swelling and limitation in mobility. Denies headaches, seizures, numbness, or tingling. Denies depression, anxiety or insomnia. Denies skin break down or rash.        Objective:   Physical Exam   Patient alert and oriented and in no cardiopulmonary distress.  HEENT: No facial asymmetry, EOMI, no sinus tenderness,  oropharynx pink and moist.  Neck supple no adenopathy.  Chest: Clear to auscultation bilaterally.  CVS: S1, S2 no murmurs, no S3.  ABD: Soft non tender. Bowel sounds normal.  Ext: No edema  MS: Adequate ROM spine, shoulders, hips and knees.  Skin: Intact, no ulcerations or rash noted.  Psych: Good eye contact, normal affect. Memory intact not anxious or depressed appearing.  CNS: CN 2-12 intact, power, tone and sensation normal throughout.      Assessment & Plan:

## 2012-06-15 NOTE — Assessment & Plan Note (Signed)
Controlled, no change in medication DASH diet and commitment to daily physical activity for a minimum of 30 minutes discussed and encouraged, as a part of hypertension management. The importance of attaining a healthy weight is also discussed.  

## 2012-06-15 NOTE — Addendum Note (Signed)
Addended by: Kandis Fantasia B on: 06/15/2012 04:15 PM   Modules accepted: Orders

## 2012-08-27 ENCOUNTER — Other Ambulatory Visit: Payer: Self-pay | Admitting: Family Medicine

## 2012-09-27 ENCOUNTER — Encounter: Payer: Self-pay | Admitting: Family Medicine

## 2012-09-27 ENCOUNTER — Ambulatory Visit (INDEPENDENT_AMBULATORY_CARE_PROVIDER_SITE_OTHER): Payer: PRIVATE HEALTH INSURANCE | Admitting: Family Medicine

## 2012-09-27 VITALS — BP 114/62 | HR 70 | Resp 18 | Ht 63.0 in | Wt 205.1 lb

## 2012-09-27 DIAGNOSIS — E119 Type 2 diabetes mellitus without complications: Secondary | ICD-10-CM

## 2012-09-27 DIAGNOSIS — M48061 Spinal stenosis, lumbar region without neurogenic claudication: Secondary | ICD-10-CM

## 2012-09-27 DIAGNOSIS — I10 Essential (primary) hypertension: Secondary | ICD-10-CM

## 2012-09-27 DIAGNOSIS — R109 Unspecified abdominal pain: Secondary | ICD-10-CM

## 2012-09-27 DIAGNOSIS — M549 Dorsalgia, unspecified: Secondary | ICD-10-CM

## 2012-09-27 DIAGNOSIS — E669 Obesity, unspecified: Secondary | ICD-10-CM

## 2012-09-27 LAB — POCT URINALYSIS DIPSTICK
Bilirubin, UA: NEGATIVE
Blood, UA: NEGATIVE
Glucose, UA: NEGATIVE
Ketones, UA: NEGATIVE
Leukocytes, UA: NEGATIVE
Nitrite, UA: NEGATIVE
pH, UA: 6

## 2012-09-27 MED ORDER — HYDROCODONE-ACETAMINOPHEN 7.5-300 MG PO TABS: ORAL_TABLET | ORAL | Status: AC

## 2012-09-27 NOTE — Progress Notes (Signed)
  Subjective:    Patient ID: Shannon Romero, female    DOB: 1945-11-07, 67 y.o.   MRN: [id number]  HPI Pt in with a 2 month h/o left sided flank pain which is constant. PDenies hematuria, has had no fever, has no h/o kidney stones The pain radiates from her back, and she reports  Progressive left lower extremity weakness and numbness She states that at times when she has to get out of bed to go the bathroom at night she is "crippled " and can barely make it due to pain and weakness in the legs, left more so than right She has know disc disease, recently had 2 epidural injections which she states were not at all helpful Her quality of life is deteriorating due to progressive lower extremity weakness and numbness wiith high fall risk and uncontrolled pain MRI lumbar spine in 2008 showed compression at L4-L5 nerve root   Review of Systems See HPI Denies recent fever or chills. Denies sinus pressure, nasal congestion, ear pain or sore throat. Denies chest congestion, productive cough or wheezing. Denies chest pains, palpitations and leg swelling Denies abdominal pain, nausea, vomiting,diarrhea or constipation.   Denies dysuria, frequency, hesitancy or incontinence.  Denies headaches, seizures, numbness, or tingling.  Denies skin break down or rash.        Objective:   Physical Exam   Patient alert and oriented and in no cardiopulmonary distress.Pt in pain  HEENT: No facial asymmetry, EOMI, no sinus tenderness,  oropharynx pink and moist.  Neck supple no adenopathy.  Chest: Clear to auscultation bilaterally.  CVS: S1, S2 no murmurs, no S3.  ABD: Soft left flank tenderness, no guarding or rebound.No organopmegaly or mas palpated. Normal bowel sounds  Ext: No edema  MS: decreased  ROM thoracolumbar spine,  hips and knees.  Skin: Intact, no ulcerations or rash noted.  Psych: Good eye contact, normal affect. Memory intact not anxious or depressed appearing.  CNS: CN 2-12  intact, grade 3 power and reduced sensation in left lower extremity.Reflexes normal throughout      Assessment & Plan:

## 2012-09-27 NOTE — Patient Instructions (Addendum)
F/u in 4 month, please call if you need me before  You need to stop tramadol for pain since this is not helping  New is vicodin one twice daily  You will be referred for a scan of your kidneys, due to flank pain, also for MRI of your mid and low spine due to worsened , uncontrolled back pain, despite recent epidurals, with intermittent lower extremity weakness and numbness  HBA1C today and che7 and EGFR  CBC fasting lipid, chem 7, hBA1C and TSH in 4 month

## 2012-09-28 LAB — COMPLETE METABOLIC PANEL WITH GFR
ALT: 20 U/L (ref 0–35)
Albumin: 4 g/dL (ref 3.5–5.2)
Alkaline Phosphatase: 72 U/L (ref 39–117)
CO2: 32 mEq/L (ref 19–32)
GFR, Est Non African American: 55 mL/min — ABNORMAL LOW
Glucose, Bld: 80 mg/dL (ref 70–99)
Potassium: 4.4 mEq/L (ref 3.5–5.3)
Sodium: 143 mEq/L (ref 135–145)
Total Bilirubin: 0.3 mg/dL (ref 0.3–1.2)
Total Protein: 7 g/dL (ref 6.0–8.3)

## 2012-09-28 LAB — HEMOGLOBIN A1C: Mean Plasma Glucose: 146 mg/dL — ABNORMAL HIGH (ref <117)

## 2012-09-28 NOTE — Assessment & Plan Note (Signed)
Detriorated, no response to epidural injections. associared with lower extremity weakness  and near falls   Will change to vicodin and request updated MRI

## 2012-09-30 DIAGNOSIS — R109 Unspecified abdominal pain: Secondary | ICD-10-CM | POA: Insufficient documentation

## 2012-09-30 NOTE — Assessment & Plan Note (Signed)
Left flank pain , x 2 month, also medical renal disease evaluation

## 2012-09-30 NOTE — Assessment & Plan Note (Signed)
Controlled, no change in medication Patient advised to reduce carb and sweets, commit to regular physical activity, take meds as prescribed, test blood as directed, and attempt to lose weight, to improve blood sugar control.  

## 2012-09-30 NOTE — Assessment & Plan Note (Signed)
Controlled, no change in medication DASH diet and commitment to daily physical activity for a minimum of 30 minutes discussed and encouraged, as a part of hypertension management. The importance of attaining a healthy weight is also discussed.  

## 2012-09-30 NOTE — Assessment & Plan Note (Signed)
Improved. Pt applauded on succesful weight loss through lifestyle change, and encouraged to continue same. Weight loss goal set for the next several months.  

## 2012-10-05 ENCOUNTER — Telehealth: Payer: Self-pay | Admitting: Family Medicine

## 2012-10-05 NOTE — Telephone Encounter (Signed)
Patient is aware no prep

## 2012-10-06 ENCOUNTER — Other Ambulatory Visit: Payer: Self-pay

## 2012-10-06 MED ORDER — METFORMIN HCL 1000 MG PO TABS: ORAL_TABLET | ORAL | Status: DC

## 2012-10-07 ENCOUNTER — Ambulatory Visit (HOSPITAL_COMMUNITY)
Admission: RE | Admit: 2012-10-07 | Discharge: 2012-10-07 | Disposition: A | Discharge status: Home / Self Care | Payer: Medicare (Managed Care) | Source: Ambulatory Visit | Attending: Family Medicine | Admitting: Family Medicine

## 2012-10-07 ENCOUNTER — Other Ambulatory Visit (HOSPITAL_COMMUNITY): Payer: PRIVATE HEALTH INSURANCE

## 2012-10-07 DIAGNOSIS — M549 Dorsalgia, unspecified: Secondary | ICD-10-CM

## 2012-10-07 DIAGNOSIS — IMO0002 Reserved for concepts with insufficient information to code with codable children: Secondary | ICD-10-CM | POA: Insufficient documentation

## 2012-10-07 DIAGNOSIS — M5126 Other intervertebral disc displacement, lumbar region: Secondary | ICD-10-CM | POA: Insufficient documentation

## 2012-10-07 DIAGNOSIS — I10 Essential (primary) hypertension: Secondary | ICD-10-CM | POA: Insufficient documentation

## 2012-10-07 DIAGNOSIS — E119 Type 2 diabetes mellitus without complications: Secondary | ICD-10-CM | POA: Insufficient documentation

## 2012-10-07 DIAGNOSIS — M545 Low back pain, unspecified: Secondary | ICD-10-CM | POA: Insufficient documentation

## 2012-10-07 DIAGNOSIS — R109 Unspecified abdominal pain: Secondary | ICD-10-CM | POA: Insufficient documentation

## 2012-10-09 ENCOUNTER — Other Ambulatory Visit: Payer: Self-pay | Admitting: Family Medicine

## 2012-10-09 DIAGNOSIS — M5106 Intervertebral disc disorders with myelopathy, lumbar region: Secondary | ICD-10-CM

## 2012-11-15 ENCOUNTER — Ambulatory Visit (HOSPITAL_COMMUNITY)
Admission: RE | Admit: 2012-11-15 | Discharge: 2012-11-15 | Disposition: A | Discharge status: Home / Self Care | Payer: Medicare Other | Source: Ambulatory Visit | Attending: Neurosurgery | Admitting: Neurosurgery

## 2012-11-15 DIAGNOSIS — IMO0001 Reserved for inherently not codable concepts without codable children: Secondary | ICD-10-CM | POA: Insufficient documentation

## 2012-11-15 DIAGNOSIS — E119 Type 2 diabetes mellitus without complications: Secondary | ICD-10-CM | POA: Insufficient documentation

## 2012-11-15 DIAGNOSIS — M545 Low back pain, unspecified: Secondary | ICD-10-CM | POA: Insufficient documentation

## 2012-11-15 DIAGNOSIS — I10 Essential (primary) hypertension: Secondary | ICD-10-CM | POA: Insufficient documentation

## 2012-11-15 NOTE — Evaluation (Signed)
Physical Therapy Evaluation  Patient Details  Name: Shannon Romero MRN: [id number] Date of Birth: 1945/05/04  Today's Date: 11/15/2012 Time: 1478-2956 PT Time Calculation (min): 36 min Charges:  1 evaluation             Visit#: 1 of 12  Re-eval: 12/15/12 Assessment Diagnosis: Low back pain Next MD Visit: Dr. Lovell Sheehan October 17 Prior Therapy: None  Authorization: Medicare    Authorization Time Period:    Authorization Visit#: 1 of 10   Past Medical History:  Past Medical History  Diagnosis Date  . Hypertension   . Diabetes mellitus   . GERD (gastroesophageal reflux disease)    Past Surgical History:  Past Surgical History  Procedure Laterality Date  . Tubal ligation    . Ovarian cyst removal      Subjective Symptoms/Limitations Symptoms: osteoarthritis  Pertinent History: Pt is referred to PT for low back pain with reports of radicular pain to her BLE with most radicular pain to her LLE into her foot which started after recieving an epidural for her back pain in March 2014.  She has had ongoing back pain for about 10 years of insidous onset.  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  She has a hx of fall last year and landed on her tailbone. She uses a SPC due to falls in the past. She reports she has numbness to her BLE in the past about 6-7 years ago.  Limitations: House hold activities;Walking;Standing;Sitting How long can you sit comfortably?: 60 minutes  How long can you stand comfortably?: 15 minutes How long can you walk comfortably?: 10 minutes with her SPC Patient Stated Goals: decrease pain Pain Assessment Currently in Pain?: Yes Pain Score: 5  (Dull/achy) Pain Location: Back Pain Orientation: Left;Right Pain Radiating Towards: BLE (LLE>RLE) Pain Onset: More than a month ago Pain Frequency: Constant Pain Relieving Factors: medication   Precautions/Restrictions  Precautions Precautions: None  Balance Screening Balance Screen Has the  patient fallen in the past 6 months: No Has the patient had a decrease in activity level because of a fear of falling? : Yes Is the patient reluctant to leave their home because of a fear of falling? : No  Prior Functioning  Prior Function Vocation: On disability Comments: takes care of her grandchildren  Sensation/Coordination/Flexibility/Functional Tests Flexibility Thomas: Positive 90/90: Positive Functional Tests Functional Tests: Lower Extremity Functional Scale:  Functional Tests: + FABER Functional Tests: + Lt SLR, - Rt SLR  RLE AROM (degrees) RLE Overall AROM Comments: prone knee flexion: 95 degress radicular pain to hamstrings, hip IR: 35 degrees RLE Strength Right Hip Flexion: 3+/5 Right Hip Extension: 3/5 Right Hip ABduction: 3/5 LLE AROM (degrees) LLE Overall AROM Comments: prone knee flexion 85 degrees with radicular pain to hamstrings; hip IR: 25 degrees LLE Strength Left Hip Flexion: 3-/5 Left Hip Extension: 3/5 Left Hip ABduction: 2+/5 Lumbar Assessment Lumbar Assessment: Exceptions to [facility] Lumbar AROM Lumbar Flexion: decreased 90% Lumbar Extension: decreased 50% Palpation Palpation: significant fascial restrictions with muscle spasms to Bil gluteal region and coccyx region. atrophy to Lt gluteal muscle, pain to lumbar paraspinals  Mobility/Balance  Ambulation/Gait Ambulation/Gait: Yes Assistive device: Straight cane Gait Pattern: Antalgic;Trunk flexed;Decreased hip/knee flexion - right;Decreased hip/knee flexion - left Gait velocity: decreased Posture/Postural Control Posture/Postural Control: Postural limitations Postural Limitations: slouched, mild rounded shoulders Static Standing Balance Single Leg Stance - Right Leg: 0 Single Leg Stance - Left Leg: 0 Tandem Stance - Right Leg: 3 Tandem  Stance - Left Leg: 0 Rhomberg - Eyes Opened: 10 Rhomberg - Eyes Closed: 10   Exercise/Treatments Stretches Press Ups: 3 reps Standing Other Standing  Lumbar Exercises: Back extension x10 Supine Bridge: 5 reps Other Supine Lumbar Exercises: LLE nerve glides demo and education x5 reps each    Physical Therapy Assessment and Plan PT Assessment and Plan Clinical Impression Statement: Pt is a 67 year old female referred to PT for low back pain with radiculopathy to BLE.  After examination it is found she repsonds well to extension activities with decreased radicular pain.  Continues to have myofascial pain.   Pt will benefit from skilled therapeutic intervention in order to improve on the following deficits: Abnormal gait;Difficulty walking;Pain;Improper spinal/pelvic alignment;Impaired perceived functional ability;Decreased range of motion;Decreased balance;Decreased coordination; Impaired flexibility Prognosis: Good PT Frequency: Min 3X/week PT Duration: 6 weeks PT Treatment/Interventions: Gait training;Functional mobility training;Therapeutic activities;Therapeutic exercise;Balance training;Neuromuscular re-education;Patient/family education;Manual techniques;Modalities PT Plan: Continue with prone exercises to improve core strength, add supine ab sets, bent knee raises, hip flexor stretch (standing)  may add traction (prone), estim/heat as needed, manual techniques to gluteal region     Goals   Home Exercise Program Pt/caregiver will Perform Home Exercise Program: Independently PT Goal: Perform Home Exercise Program - Progress: Met PT Short Term Goals Time to Complete Short Term Goals: 3 weeks PT Short Term Goal 1: Pt will report decreased radicular pain by 50% to LLE while sitting for improved QOL.  PT Short Term Goal 2: Pt will improve BLE strength by 1 muscle grade for greater ease with walking 10 minutes.  PT Short Term Goal 3: Pt will be independent with core muscle activation to decrease raidcular symptoms.  PT Short Term Goal 4: Pt will improve proprioception and demonstrate Rt and Lt SLS x10 seconds on solid surface PT Short Term  Goal 5: Pt will report pain less than 3/10 for 75% of her day for improved QOL.  PT Long Term Goals Time to Complete Long Term Goals:  (6 weeks) PT Long Term Goal 1: Pt will improve her core strength to [facility] in order to sit with appropriate posture x10 minutes without radicular symptoms.  PT Long Term Goal 2: Pt will improve her lumbar flexion by 50% for greater ease with sit to stand.  Long Term Goal 3: Pt will improve her BLE strength to [facility] in order to ambulate x30 minutes with improved ease to continue with household activities.  Long Term Goal 4: Pt will be educated on proper body mechanics with sitting and standing to decrease risk of further injury PT Long Term Goal 5: Pt will improve her LEFS to 40/80 for improved QOL.   Problem List Patient Active Problem List   Diagnosis Date Noted  . Flank pain 09/30/2012  . Chest pain, atypical 02/10/2012  . 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 NECK PAIN 07/10/2008  . DIABETES MELLITUS, TYPE II 10/12/2007  . OBESITY 10/12/2007  . ANXIETY 10/12/2007  . DEPRESSION 10/12/2007  . HYPERTENSION 10/12/2007  . GERD 10/12/2007  . Back pain with radiation 10/12/2007    PT - End of Session Activity Tolerance: Patient limited by pain PT Plan of Care PT Home Exercise Plan: given PT Patient Instructions: importance of prone activities, core strength and postural awareness. Answered questions about diagnosis  GP Functional Assessment Tool Used: LEFS: 19/80 Functional Limitation: Self care Self Care Current Status (W0981): At least 60 percent but less than 80 percent impaired, limited or restricted Self Care Goal Status (X9147): At least  1 percent but less than 20 percent impaired, limited or restricted  Dishon Kehoe, MPT, ATC 11/15/2012, 9:50 AM  Physician Documentation Your signature is required to indicate approval of the treatment plan as stated above.  Please sign and either send electronically or make a copy of this report for your files and return this  physician signed original.   Please mark one 1.__approve of plan  2. ___approve of plan with the following conditions.   ______________________________                                                          _____________________ Physician Signature                                                                                                             Date

## 2012-11-21 ENCOUNTER — Ambulatory Visit (HOSPITAL_COMMUNITY)
Admission: RE | Admit: 2012-11-21 | Discharge: 2012-11-21 | Disposition: A | Discharge status: Home / Self Care | Payer: Medicare Other | Source: Ambulatory Visit | Attending: Family Medicine | Admitting: Family Medicine

## 2012-11-21 DIAGNOSIS — M549 Dorsalgia, unspecified: Secondary | ICD-10-CM

## 2012-11-21 NOTE — Progress Notes (Signed)
Physical Therapy Treatment Patient Details  Name: Shannon Romero MRN: [id number] Date of Birth: [birth year]  Today's Date: 11/21/2012 Time: 6213-0865 PT Time Calculation (min): 45 min Charges: TE: 845-915 Estim/Heat: 915-930 Visit#: 2 of 12  Re-eval: 12/15/12    Authorization: Medicare  Authorization Time Period:    Authorization Visit#: 2 of 10   Subjective: Symptoms/Limitations Symptoms: Pt reports that she has been doing okay, reports pain to her BLE.  Pain Assessment Currently in Pain?: Yes Pain Score: 5  Pain Location: Leg Pain Orientation: Right;Left  Precautions/Restrictions     Exercise/Treatments Mobility/Balance        Stretches Active Hamstring Stretch: Limitations Active Hamstring Stretch Limitations: Nerve Glides BLE x10 regs Prone on Elbows Stretch: 3 reps;60 seconds Press Ups: Limitations Press Ups Limitations: 10 reps Aerobic   Machines for Strengthening   Standing   Seated   Supine Ab Set: 10 reps Clam: 5 reps (alternating) Bent Knee Raise: 5 reps (alternating BLE) Bridge: 10 reps Sidelying Hip Abduction: 5 reps (BLE) Other Sidelying Lumbar Exercises: hip abducion 5 reps BLE Prone  Single Arm Raise: Right;Left;5 reps Straight Leg Raise: 5 reps Opposite Arm/Leg Raise: Right arm/Left leg;Left arm/Right leg;5 reps Quadruped    Modalities Modalities: Moist Heat;Electrical Stimulation Moist Heat Therapy Number Minutes Moist Heat: 15 Minutes Moist Heat Location:  (back) Museum/gallery exhibitions officer Stimulation Location: lumbosacral region Electrical Stimulation Action: IFES Electrical Stimulation Parameters: x15 minutes  Electrical Stimulation Goals: Pain  Physical Therapy Assessment and Plan PT Assessment and Plan Clinical Impression Statement: Added core stabilization activities today with pt report of 100% decrease in radicular symptoms.  Added e-stim at end of session to decrease low back pain. Pt requires min-mod cueing  for proper core coordination. Pt pain to low back decreases to 1/10 at end of session. PT Frequency: Min 3X/week PT Duration: 6 weeks PT Treatment/Interventions: Gait training;Functional mobility training;Therapeutic activities;Therapeutic exercise;Balance training;Neuromuscular re-education;Patient/family education;Manual techniques;Modalities PT Plan: Add therabands, mat supine SLR.  Progress to heel and toe raises and squats if able.  Pt does best with prone exercises and minimal hooklying position. May add prone traction if needed.  f/u with e-stim and heat    Goals Home Exercise Program Pt/caregiver will Perform Home Exercise Program: Independently PT Goal: Perform Home Exercise Program - Progress: Met PT Short Term Goals Time to Complete Short Term Goals: 3 weeks PT Short Term Goal 1: Pt will report decreased radicular pain by 50% to LLE while sitting for improved QOL.  PT Short Term Goal 1 - Progress: Progressing toward goal PT Short Term Goal 2: Pt will improve BLE strength by 1 muscle grade for greater ease with walking 10 minutes.  PT Short Term Goal 2 - Progress: Progressing toward goal PT Short Term Goal 3: Pt will be independent with core muscle activation to decrease raidcular symptoms.  PT Short Term Goal 3 - Progress: Progressing toward goal PT Short Term Goal 4: Pt will improve proprioception and demonstrate Rt and Lt SLS x10 seconds on solid surface PT Short Term Goal 4 - Progress: Progressing toward goal PT Short Term Goal 5: Pt will report pain less than 3/10 for 75% of her day for improved QOL.  PT Short Term Goal 5 - Progress: Progressing toward goal PT Long Term Goals Time to Complete Long Term Goals:  (6 weeks) PT Long Term Goal 1: Pt will improve her core strength to [facility] in order to sit with appropriate posture x10 minutes without radicular symptoms.  PT Long Term Goal  1 - Progress: Progressing toward goal PT Long Term Goal 2: Pt will improve her lumbar flexion by  50% for greater ease with sit to stand.  PT Long Term Goal 2 - Progress: Progressing toward goal Long Term Goal 3: Pt will improve her BLE strength to [facility] in order to ambulate x30 minutes with improved ease to continue with household activities.  Long Term Goal 3 Progress: Progressing toward goal Long Term Goal 4: Pt will be educated on proper body mechanics with sitting and standing to decrease risk of further injury Long Term Goal 4 Progress: Progressing toward goal PT Long Term Goal 5: Pt will improve her LEFS to 40/80 for improved QOL.  Long Term Goal 5 Progress: Progressing toward goal  Problem List Patient Active Problem List   Diagnosis Date Noted  . Flank pain 09/30/2012  . Chest pain, atypical 02/10/2012  . 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 NECK PAIN 07/10/2008  . DIABETES MELLITUS, TYPE II 10/12/2007  . OBESITY 10/12/2007  . ANXIETY 10/12/2007  . DEPRESSION 10/12/2007  . HYPERTENSION 10/12/2007  . GERD 10/12/2007  . Back pain with radiation 10/12/2007    PT - End of Session Activity Tolerance: Patient tolerated treatment well PT Plan of Care PT Home Exercise Plan: updated Consulted and Agree with Plan of Care: Patient  GP Functional Assessment Tool Used: LEFS: 19/80  Shalamar Plourde, MPT, ATC 11/21/2012, 9:29 AM

## 2012-11-23 ENCOUNTER — Ambulatory Visit (HOSPITAL_COMMUNITY)
Admission: RE | Admit: 2012-11-23 | Discharge: 2012-11-23 | Disposition: A | Discharge status: Home / Self Care | Payer: Medicare Other | Source: Ambulatory Visit | Attending: Neurosurgery | Admitting: Neurosurgery

## 2012-11-23 NOTE — Progress Notes (Signed)
Physical Therapy Treatment Patient Details  Name: Shannon Romero MRN: [id number] Date of Birth: 11-15-1945  Today's Date: 11/23/2012 Time: 1300-1345 PT Time Calculation (min): 45 min Visit#: 3 of 12  Re-eval: 12/15/12 Charges:  therex 1300-1328 (28'), IFES/MHP 1330-1345 (15') Authorization: Medicare  Authorization Visit#: 3 of 10   Subjective: Symptoms/Limitations Symptoms: Pt states unsure if last visit helped or not.  Currently with 3/10 lumbar pain today and running into Bilateral hips but not into LE's today. Pain Assessment Currently in Pain?: Yes Pain Score: 3  Pain Location: Back Pain Orientation: Right;Left   Exercise/Treatments Stretches Prone on Elbows Stretch: 3 reps;60 seconds;Limitations Prone on Elbows Stretch Limitations: 3 minutes Press Ups: Limitations Press Ups Limitations: 10 reps Standing Heel Raises: 10 reps;Limitations Heel Raises Limitations: toeraises 10 reps Functional Squats: 10 reps Scapular Retraction: 10 reps;Theraband Theraband Level (Scapular Retraction): Level 3 (Green) Row: 10 reps;Theraband Theraband Level (Row): Level 3 (Green) Shoulder Extension: 10 reps;Theraband Theraband Level (Shoulder Extension): Level 3 (Green) Supine Ab Set: 10 reps Clam: 10 reps Bent Knee Raise: 10 reps Bridge: 10 reps Sidelying Hip Abduction: 10 reps Prone  Single Arm Raise: 10 reps Straight Leg Raise: 10 reps Opposite Arm/Leg Raise: 5 reps    Modalities Modalities: Moist Heat;Electrical Stimulation Moist Heat Therapy Number Minutes Moist Heat: 15 Minutes Moist Heat Location:  (back) Museum/gallery exhibitions officer Stimulation Location: lumbosacral region Electrical Stimulation Action: IFES to decrease pain Electrical Stimulation Parameters: hi/lo sweep intensity 10 Volts Electrical Stimulation Goals: Pain  Physical Therapy Assessment and Plan PT Assessment and Plan Clinical Impression Statement: Added postural stability exercises with  theraband, squats, heel and toe raises to POC without difficulty othere than cues for form.  Continued with estim/heat at end of session to help decrease pain.  noted pain reduction to 1/10 at end of session. PT Plan: Pt does best with prone exercises and minimal hooklying position. May try prone traction if minimal progress.  Add supine SLR next visit and progress other stab exercises.     Problem List Patient Active Problem List   Diagnosis Date Noted  . Flank pain 09/30/2012  . Chest pain, atypical 02/10/2012  . 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 NECK PAIN 07/10/2008  . DIABETES MELLITUS, TYPE II 10/12/2007  . OBESITY 10/12/2007  . ANXIETY 10/12/2007  . DEPRESSION 10/12/2007  . HYPERTENSION 10/12/2007  . GERD 10/12/2007  . Back pain with radiation 10/12/2007    PT - End of Session Activity Tolerance: Patient tolerated treatment well General Behavior During Therapy: [facility] for tasks assessed/performed PT Plan of Care Consulted and Agree with Plan of Care: Patient   Lurena Nida, PTA/CLT 11/23/2012, 1:38 PM

## 2012-11-25 ENCOUNTER — Ambulatory Visit (HOSPITAL_COMMUNITY)
Admission: RE | Admit: 2012-11-25 | Discharge: 2012-11-25 | Disposition: A | Discharge status: Home / Self Care | Payer: Medicare Other | Source: Ambulatory Visit | Attending: Neurosurgery | Admitting: Neurosurgery

## 2012-11-25 DIAGNOSIS — M549 Dorsalgia, unspecified: Secondary | ICD-10-CM

## 2012-11-25 NOTE — Progress Notes (Signed)
Physical Therapy Treatment Patient Details  Name: Shannon Romero MRN: [id number] Date of Birth: 09/26/1945  Today's Date: 11/25/2012 Time: 7829-5621 PT Time Calculation (min): 40 min Charges:  TE: 3086-5784 NMR: 6962-9528 Manual: 1125-1145 Visit#: 4 of 12  Re-eval: 12/15/12    Authorization: Medicare  Authorization Time Period:    Authorization Visit#: 4 of 10   Subjective: Symptoms/Limitations Symptoms: Pt reports that the e-stim helps temoporaily and is not having much relief with it. reports she is doing some of her standing exercises.  Pain Assessment Pain Score: 7  Pain Location: Back  Precautions/Restrictions     Exercise/Treatments Standing Heel Raises: 10 reps;Limitations Heel Raises Limitations: toeraises 10 reps Functional Squats: 10 reps Scapular Retraction: 10 reps;Theraband Theraband Level (Scapular Retraction): Level 3 (Green) Row: 10 reps;Theraband Theraband Level (Row): Level 3 (Green) Shoulder Extension: 10 reps;Theraband Theraband Level (Shoulder Extension): Level 3 (Green) Shoulder ADduction: Both;10 reps;Theraband Theraband Level (Shoulder Adduction): Level 3 (Green) Seated Other Seated Lumbar Exercises: Heel Roll 10x10 sec Tandem Gait: 2 reps Retro Gait: 2 reps Sidestepping: 2 reps     Manual Therapy Manual Therapy: Myofascial release Myofascial Release: Supine and prone to BLE to decrease fascial restrictions with soft tissue and connective tissue release.   Physical Therapy Assessment and Plan PT Assessment and Plan Clinical Impression Statement: Treatment focused on adding balance activities to improve body awareness and improve hip strength. Added manual therapy at end of session with reduction in all radicular symptoms.  PT Plan: Check SI alignment, manual therapy to BLE, continue with balance exercises and core strengthening.     Goals Home Exercise Program Pt/caregiver will Perform Home Exercise Program: Independently PT Short  Term Goals Time to Complete Short Term Goals: 3 weeks PT Short Term Goal 1: Pt will report decreased radicular pain by 50% to LLE while sitting for improved QOL.  PT Short Term Goal 1 - Progress: Progressing toward goal PT Short Term Goal 2: Pt will improve BLE strength by 1 muscle grade for greater ease with walking 10 minutes.  PT Short Term Goal 2 - Progress: Progressing toward goal PT Short Term Goal 3: Pt will be independent with core muscle activation to decrease raidcular symptoms.  PT Short Term Goal 3 - Progress: Progressing toward goal PT Short Term Goal 4: Pt will improve proprioception and demonstrate Rt and Lt SLS x10 seconds on solid surface PT Short Term Goal 4 - Progress: Progressing toward goal PT Short Term Goal 5: Pt will report pain less than 3/10 for 75% of her day for improved QOL.  PT Short Term Goal 5 - Progress: Met PT Long Term Goals Time to Complete Long Term Goals:  (6 weeks) PT Long Term Goal 1: Pt will improve her core strength to [facility] in order to sit with appropriate posture x10 minutes without radicular symptoms.  PT Long Term Goal 1 - Progress: Progressing toward goal PT Long Term Goal 2: Pt will improve her lumbar flexion by 50% for greater ease with sit to stand.  PT Long Term Goal 2 - Progress: Progressing toward goal Long Term Goal 3: Pt will improve her BLE strength to [facility] in order to ambulate x30 minutes with improved ease to continue with household activities.  Long Term Goal 3 Progress: Progressing toward goal Long Term Goal 4: Pt will be educated on proper body mechanics with sitting and standing to decrease risk of further injury Long Term Goal 4 Progress: Progressing toward goal PT Long Term Goal 5: Pt will improve  her LEFS to 40/80 for improved QOL.  Long Term Goal 5 Progress: Progressing toward goal  Problem List Patient Active Problem List   Diagnosis Date Noted  . Flank pain 09/30/2012  . Chest pain, atypical 02/10/2012  . Furunculosis  03/22/2011  . NECK PAIN 07/10/2008  . DIABETES MELLITUS, TYPE II 10/12/2007  . OBESITY 10/12/2007  . ANXIETY 10/12/2007  . DEPRESSION 10/12/2007  . HYPERTENSION 10/12/2007  . GERD 10/12/2007  . Back pain with radiation 10/12/2007    PT - End of Session Activity Tolerance: Patient tolerated treatment well General Behavior During Therapy: [facility] for tasks assessed/performed  GP    [place] Delauder, MPT, ATC 11/25/2012, 12:09 PM

## 2012-11-28 ENCOUNTER — Ambulatory Visit (HOSPITAL_COMMUNITY)
Admission: RE | Admit: 2012-11-28 | Discharge: 2012-11-28 | Disposition: A | Discharge status: Home / Self Care | Payer: Medicare Other | Source: Ambulatory Visit | Attending: Family Medicine | Admitting: Family Medicine

## 2012-11-28 DIAGNOSIS — M549 Dorsalgia, unspecified: Secondary | ICD-10-CM

## 2012-11-28 NOTE — Progress Notes (Signed)
Physical Therapy Treatment Patient Details  Name: Shannon Romero MRN: [id number] Date of Birth: June 28, 1945  Today's Date: 11/28/2012 Time: 8657-8469 PT Time Calculation (min): 38 min Charges: Manual: [id number] TE: 1000-10010 [place]: 1010-1018 Visit#: 5 of 12  Re-eval: 12/15/12    Authorization: Medicare  Authorization Time Period:    Authorization Visit#: 5 of 10   Subjective: Symptoms/Limitations Symptoms: Pt reports that she is sore from last visit.  States she did not increase her water after manual reports she would like to continue with it.  Pain Assessment Currently in Pain?: Yes Pain Score: 5  Pain Location: Leg Pain Orientation: Left  Precautions/Restrictions     Exercise/Treatments Stretches Passive Hamstring Stretch: 1 rep;60 seconds Single Knee to Chest Stretch: 1 rep;60 seconds Hip Flexor Stretch: 1 rep;60 seconds;Limitations Hip Flexor Stretch Limitations: Prone; IR stretch 1x 60 sec Piriformis Stretch: 1 rep;60 seconds  Modalities Modalities: Ultrasound Manual Therapy Manual Therapy: Myofascial release Myofascial Release: Supine w/legs elevated and prone to LLE quadricep, adductors, ITB, hamstring and gluteal region to decrease fascial restrictions with soft tissue and connective tissue release  Ultrasound Ultrasound Location: Lt piriformis Ultrasound Parameters: 8 minutes, 1 Mhz, 1.5 w/cm2 continous Ultrasound Goals: Pain  Physical Therapy Assessment and Plan PT Assessment and Plan Clinical Impression Statement: Added [place] after manual and stretching activities to piriformis region to decrease pain.  Pt reports minimal change in pain at end of treatment. Encouraged to drink more water.  Pt continues to require mod cueing for diaphragmatic breathing throughout treatment.  Provided with green t-band and postural exercises for HEP and explained use of T-band.  PT Plan: Check SI alignment, manual therapy to LLE, f/u on [place], continue with core and balance  exercises once pain is reduced.     Goals Home Exercise Program Pt/caregiver will Perform Home Exercise Program: Independently PT Goal: Perform Home Exercise Program - Progress: Met PT Short Term Goals Time to Complete Short Term Goals: 3 weeks PT Short Term Goal 1: Pt will report decreased radicular pain by 50% to LLE while sitting for improved QOL.  PT Short Term Goal 1 - Progress: Met PT Short Term Goal 2: Pt will improve BLE strength by 1 muscle grade for greater ease with walking 10 minutes.  PT Short Term Goal 2 - Progress: Progressing toward goal PT Short Term Goal 3: Pt will be independent with core muscle activation to decrease raidcular symptoms.  PT Short Term Goal 3 - Progress: Progressing toward goal PT Short Term Goal 4: Pt will improve proprioception and demonstrate Rt and Lt SLS x10 seconds on solid surface PT Short Term Goal 4 - Progress: Progressing toward goal PT Short Term Goal 5: Pt will report pain less than 3/10 for 75% of her day for improved QOL.  PT Short Term Goal 5 - Progress: Progressing toward goal PT Long Term Goals Time to Complete Long Term Goals:  (6 weeks) PT Long Term Goal 1: Pt will improve her core strength to [facility] in order to sit with appropriate posture x10 minutes without radicular symptoms.  PT Long Term Goal 2: Pt will improve her lumbar flexion by 50% for greater ease with sit to stand.  Long Term Goal 3: Pt will improve her BLE strength to [facility] in order to ambulate x30 minutes with improved ease to continue with household activities.  Long Term Goal 4: Pt will be educated on proper body mechanics with sitting and standing to decrease risk of further injury PT Long Term Goal 5: Pt  will improve her LEFS to 40/80 for improved QOL.   Problem List Patient Active Problem List   Diagnosis Date Noted  . Flank pain 09/30/2012  . Chest pain, atypical 02/10/2012  . 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 NECK PAIN 07/10/2008  . DIABETES MELLITUS, TYPE II 10/12/2007   . OBESITY 10/12/2007  . ANXIETY 10/12/2007  . DEPRESSION 10/12/2007  . HYPERTENSION 10/12/2007  . GERD 10/12/2007  . Back pain with radiation 10/12/2007    PT - End of Session Activity Tolerance: Patient tolerated treatment well General Behavior During Therapy: [facility]FL for tasks assessed/performed PT Plan of Care PT Home Exercise Plan: updated with green t-band and postural exercises.  Consulted and Agree with Plan of Care: Patient  GP    Hollyn Stucky, MPT, ATC 11/28/2012, 11:13 AM

## 2012-11-30 ENCOUNTER — Ambulatory Visit (HOSPITAL_COMMUNITY)
Admission: RE | Admit: 2012-11-30 | Discharge: 2012-11-30 | Disposition: A | Discharge status: Home / Self Care | Payer: Medicare Other | Source: Ambulatory Visit | Attending: Neurosurgery | Admitting: Neurosurgery

## 2012-11-30 DIAGNOSIS — M549 Dorsalgia, unspecified: Secondary | ICD-10-CM

## 2012-11-30 NOTE — Progress Notes (Signed)
Physical Therapy Treatment Patient Details  Name: Shannon Romero MRN: [id number] Date of Birth: [birth year]  Today's Date: 11/30/2012 Time: 4098-1191 PT Time Calculation (min): 38 min Charges: Manual: 855-915 [place]: 915-923 TE: 923-933 Visit#: 6 of 12  Re-eval: 12/15/12    Authorization: Medicare  Authorization Time Period:    Authorization Visit#: 6 of 10   Subjective:  Pt reports that she feels the manual therapy is helping a lot.  She continued to perform her exercises at home to improve her strength. Pain: 4/10 Pain location: Lt Hip  Precautions/Restrictions     Exercise/Treatments Stretches Piriformis Stretch: 2 reps;60 seconds Standing Other Standing Lumbar Exercises: gastrco stretch on slant board 3x30 sec    Modalities  Modalities: Ultrasound  Manual Therapy  Manual Therapy: Myofascial release  Myofascial Release: Prone to BLE: ITB, hamstring and gluteal region to decrease fascial restrictions with soft tissue and connective tissue release  Ultrasound  Ultrasound Location: Lt piriformis  Ultrasound Parameters: 8 minutes, 1 Mhz, 1.5 w/cm2 continous  Ultrasound Goals: Pain   Physical Therapy Assessment and Plan PT Assessment and Plan Clinical Impression Statement: Pt had notable fascial restrictions to Rt gluteal and ITB region which decreased 50% after manual therapy.  Presents with minimal fascial restrictions and muscle spasms to Lt gluteal region.  At this time explained pt continues to have impairments due to Rt sided impairments.  PT Plan: Check SI alignment, manual therapy to LLE, f/u on [place], continue with core and balance exercises once pain is reduced.     Goals    Problem List Patient Active Problem List   Diagnosis Date Noted  . Flank pain 09/30/2012  . Chest pain, atypical 02/10/2012  . 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 NECK PAIN 07/10/2008  . DIABETES MELLITUS, TYPE II 10/12/2007  . OBESITY 10/12/2007  . ANXIETY 10/12/2007  . DEPRESSION 10/12/2007   . HYPERTENSION 10/12/2007  . GERD 10/12/2007  . Back pain with radiation 10/12/2007    PT - End of Session Activity Tolerance: Patient tolerated treatment well General Behavior During Therapy: [facility] for tasks assessed/performed PT Plan of Care PT Home Exercise Plan: updated with gastroc and piriformis stretch Consulted and Agree with Plan of Care: Patient  GP    Kendricks Reap, MPT,ATC 11/30/2012, 10:17 AM

## 2012-12-02 ENCOUNTER — Ambulatory Visit (HOSPITAL_COMMUNITY)
Admission: RE | Admit: 2012-12-02 | Discharge: 2012-12-02 | Disposition: A | Discharge status: Home / Self Care | Payer: Medicare Other | Source: Ambulatory Visit | Attending: Neurosurgery | Admitting: Neurosurgery

## 2012-12-02 DIAGNOSIS — M549 Dorsalgia, unspecified: Secondary | ICD-10-CM

## 2012-12-02 NOTE — Progress Notes (Signed)
Physical Therapy Treatment Patient Details  Name: Shannon Romero MRN: [id number] Date of Birth: [birth year]  Today's Date: 12/02/2012 Time: 4098-1191 PT Time Calculation (min): 43 min Charge: Manual 1305-1330, 4782-9562, [place] 1308-6578  Visit#: 7 of 12  Re-eval: 12/15/12 Assessment Diagnosis: Low back pain Next MD Visit: Dr. Lovell Sheehan October 17 Prior Therapy: None  Authorization: Medicare  Authorization Time Period:    Authorization Visit#: 7 of 10   Subjective: Symptoms/Limitations Symptoms: Pt reported increased pain Bil hips L>R, stated she was on her feet alot yesterday taking grandkids to school. Pain Assessment Currently in Pain?: Yes Pain Score: 8  Pain Location: Hip Pain Orientation: Left  Precautions/Restrictions  Precautions Precautions: None  Exercise/Treatments Aerobic Tread Mill: Gait training following MET 5' total 2'30" forward 1.0 mph, 2'30"@ 0.33mph Seated Other Seated Lumbar Exercises: Postural cueing with ab set  Manual Therapy Manual Therapy: Other (comment) Myofascial Release: Prone position to Lt gluteal region, hamstrings, IT band, and adductors to decrease fascial restrictions with soft tissue massage [id number] Other Manual Therapy: MET for Rt anterior rotation f/b gait training TM Electrical Stimulation Electrical Stimulation Location:    Ultrasound Ultrasound Location: Lt piriformis Ultrasound Parameters: 8 minutes, 1 Mhz, 1.5 w/cm2 continous Ultrasound Goals: Pain  Physical Therapy Assessment and Plan PT Assessment and Plan Clinical Impression Statement: MET for Rt anterior rotation f/b gait training on TM to set SI alignment.  Pt educated on technique and importance of core sets during gait for SI alignment.  [place] with manual techniques following complete to reduce fascial restrictions to Lt piriformis, with MFR/STM to gluteal region, hamstrings,iliotibial band and adductors.  Pt reported pain scale 3/10 end of session (reduced by 5 grades.)  Pt  encouraged to drink extra water today to reduce risk of headaches, continue focusing on core strengthening/HEP and to reduce weight in pocketbook. PT Plan: Check SI alignment, manual therapy to LLE, f/u on [place], continue with core, stretches, and balance exercises once pain is reduced.     Goals    Problem List Patient Active Problem List   Diagnosis Date Noted  . Flank pain 09/30/2012  . Chest pain, atypical 02/10/2012  . 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 NECK PAIN 07/10/2008  . DIABETES MELLITUS, TYPE II 10/12/2007  . OBESITY 10/12/2007  . ANXIETY 10/12/2007  . DEPRESSION 10/12/2007  . HYPERTENSION 10/12/2007  . GERD 10/12/2007  . Back pain with radiation 10/12/2007    PT - End of Session Activity Tolerance: Patient tolerated treatment well;Patient limited by pain General Behavior During Therapy: [facility] for tasks assessed/performed  GP    Juel Burrow 12/02/2012, 2:25 PM

## 2012-12-05 ENCOUNTER — Ambulatory Visit (HOSPITAL_COMMUNITY)
Admission: RE | Admit: 2012-12-05 | Discharge: 2012-12-05 | Disposition: A | Discharge status: Home / Self Care | Payer: Medicare Other | Source: Ambulatory Visit | Attending: Neurosurgery | Admitting: Neurosurgery

## 2012-12-05 DIAGNOSIS — M549 Dorsalgia, unspecified: Secondary | ICD-10-CM

## 2012-12-05 NOTE — Progress Notes (Signed)
Physical Therapy Treatment Patient Details  Name: Shannon Romero MRN: [id number] Date of Birth: 09-24-1945  Today's Date: 12/05/2012 Time: 9562-1308 PT Time Calculation (min): 44 min Charges: TE: [phone] Manual: [phone] [place]: 1009-1017 Visit#: 8 of 12  Re-eval: 12/15/12    Authorization: Medicare  Authorization Time Period:    Authorization Visit#: 8 of 10   Subjective: Symptoms/Limitations Symptoms: Pt reports she still has some pain in her Lt hip and reports that it has improved slight from manual techniques on Friday. Reports that she still has buring into the back of the butt and into her thighs Pain Assessment Currently in Pain?: Yes Pain Score: 5  Pain Location: Hip Pain Orientation: Left  Precautions/Restrictions     Exercise/Treatments Stretches Passive Hamstring Stretch: 3 reps;30 seconds (BLE) Piriformis Stretch: 2 reps;30 seconds (BLE) Standing Heel Raises: 15 reps Heel Raises Limitations: toeraises 15 reps Functional Squats: 15 reps Tandem Stance 2x30 sec BLE Seated Other Seated Lumbar Exercises: Heel and Toe roll in and outs 7x10 sec holds Supine Other Supine Lumbar Exercises: Toe roll in and outs x15 reps  Manual Therapy Manual Therapy: Other (comment) Myofascial Release: Prone position to Lt and Rt gluteal region, hamstrings, IT band, and adductors to decrease fascial restrictions with soft tissue massage Electrical Stimulation Electrical Stimulation Location:    Ultrasound Ultrasound Location: Lt and Rt piriformis  Ultrasound Parameters: Lt: 4 minutes, 1 Mhz, 1.5 w/cm2 continous  Rt: , 1 Mhz, 1.5 w/cm2 continous Ultrasound Goals: Pain  Physical Therapy Assessment and Plan PT Assessment and Plan Clinical Impression Statement: Pt has notable decrease in Rt>Lt hip ER given exercises to improve hip ER to decrease pain.  PT Plan: Check SI alignment, manual therapy to LLE, f/u on [place], continue with core, stretches, and balance exercises once  pain is reduced.     Goals Home Exercise Program Pt/caregiver will Perform Home Exercise Program: Independently PT Goal: Perform Home Exercise Program - Progress: Met PT Short Term Goals Time to Complete Short Term Goals: 3 weeks PT Short Term Goal 1: Pt will report decreased radicular pain by 50% to LLE while sitting for improved QOL.  PT Short Term Goal 1 - Progress: Met PT Short Term Goal 2: Pt will improve BLE strength by 1 muscle grade for greater ease with walking 10 minutes.  PT Short Term Goal 2 - Progress: Progressing toward goal PT Short Term Goal 3: Pt will be independent with core muscle activation to decrease raidcular symptoms.  PT Short Term Goal 3 - Progress: Progressing toward goal PT Short Term Goal 4: Pt will improve proprioception and demonstrate Rt and Lt SLS x10 seconds on solid surface PT Short Term Goal 4 - Progress: Progressing toward goal PT Short Term Goal 5: Pt will report pain less than 3/10 for 75% of her day for improved QOL.  PT Short Term Goal 5 - Progress: Progressing toward goal PT Long Term Goals Time to Complete Long Term Goals:  (6 weeks) PT Long Term Goal 1: Pt will improve her core strength to [facility] in order to sit with appropriate posture x10 minutes without radicular symptoms.  PT Long Term Goal 1 - Progress: Progressing toward goal PT Long Term Goal 2: Pt will improve her lumbar flexion by 50% for greater ease with sit to stand.  PT Long Term Goal 2 - Progress: Progressing toward goal Long Term Goal 3: Pt will improve her BLE strength to [facility] in order to ambulate x30 minutes with improved ease to continue with household  activities.  Long Term Goal 3 Progress: Progressing toward goal Long Term Goal 4: Pt will be educated on proper body mechanics with sitting and standing to decrease risk of further injury Long Term Goal 4 Progress: Progressing toward goal PT Long Term Goal 5: Pt will improve her LEFS to 40/80 for improved QOL.  Long Term Goal 5  Progress: Progressing toward goal  Problem List Patient Active Problem List   Diagnosis Date Noted  . Flank pain 09/30/2012  . Chest pain, atypical 02/10/2012  . 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 NECK PAIN 07/10/2008  . DIABETES MELLITUS, TYPE II 10/12/2007  . OBESITY 10/12/2007  . ANXIETY 10/12/2007  . DEPRESSION 10/12/2007  . HYPERTENSION 10/12/2007  . GERD 10/12/2007  . Back pain with radiation 10/12/2007    PT - End of Session Activity Tolerance: Patient tolerated treatment well;Patient limited by pain General Behavior During Therapy: [facility] for tasks assessed/performed PT Plan of Care PT Patient Instructions: heel roll outs Consulted and Agree with Plan of Care: Patient  GP    Denym Rahimi, MPT, ATC 12/05/2012, 11:16 AM

## 2012-12-07 ENCOUNTER — Ambulatory Visit (HOSPITAL_COMMUNITY)
Admission: RE | Admit: 2012-12-07 | Discharge: 2012-12-07 | Disposition: A | Discharge status: Home / Self Care | Payer: Medicare Other | Source: Ambulatory Visit | Attending: Neurosurgery | Admitting: Neurosurgery

## 2012-12-07 DIAGNOSIS — M549 Dorsalgia, unspecified: Secondary | ICD-10-CM

## 2012-12-07 NOTE — Progress Notes (Signed)
Physical Therapy Treatment Patient Details  Name: Shannon Romero MRN: [id number] Date of Birth: 1946-02-04  Today's Date: 12/07/2012 Time: 3086-5784 ([place] finished by PTT (WT)) PT Time Calculation (min): 44 min Charges:  TE: [phone] [place]: No charge 8 minutes (completed by PTT) Visit#: 9 of 12  Re-eval: 12/15/12    Authorization: Medicare  Authorization Time Period:    Authorization Visit#: 9 of 10   Subjective: Symptoms/Limitations Symptoms: "I sitll have pain in my butt"  Pt reports it remains about an 8/10 and it is difficult to sleep at night time.  States she has been sore from the manual therapy and thought it was strange she has been sore.   Precautions/Restrictions     Exercise/Treatments Aerobic Elliptical: NuStep x8 minutes seat 7 for LE strengthening. Standing Wall Slides: 10 reps;Limitations Wall Slides Limitations: w/shoulder flexin with cane  Other Standing Lumbar Exercises: heel and toe walking 1 RT Supine Bridge: 15 reps Straight Leg Raise: 10 reps (BLE) Isometric Hip Flexion: 5 reps;3 seconds (BLE) Standing Tandem Gait: 2 reps Retro Gait: 2 reps     Ultrasound Ultrasound Location: Lt piriformis  Ultrasound Parameters: 8 minutes  1 Mhz, 1.5 w/cm2 continous  Ultrasound Goals: Pain  Physical Therapy Assessment and Plan PT Assessment and Plan Clinical Impression Statement: Held manual therapy today due to continued pain with manual.  Focused on improving strength and flexibility to decreae pain.  Ultrasound at end of treatment to decrease pain.   PT Plan: Check SI alignment, manual therapy to LLE, f/u on [place], continue with core, stretches, and balance exercises once pain is reduced.     Goals    Problem List Patient Active Problem List   Diagnosis Date Noted  . Flank pain 09/30/2012  . Chest pain, atypical 02/10/2012  . 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 NECK PAIN 07/10/2008  . DIABETES MELLITUS, TYPE II 10/12/2007  . OBESITY 10/12/2007  . ANXIETY  10/12/2007  . DEPRESSION 10/12/2007  . HYPERTENSION 10/12/2007  . GERD 10/12/2007  . Back pain with radiation 10/12/2007    PT - End of Session Activity Tolerance: Patient tolerated treatment well;Patient limited by pain General Behavior During Therapy: [facility] for tasks assessed/performed  GP    Lyrick Worland, MPT, ATC 12/07/2012, 12:06 PM

## 2012-12-09 ENCOUNTER — Ambulatory Visit (HOSPITAL_COMMUNITY)
Admission: RE | Admit: 2012-12-09 | Discharge: 2012-12-09 | Disposition: A | Discharge status: Home / Self Care | Payer: Medicare Other | Source: Ambulatory Visit | Attending: Family Medicine | Admitting: Family Medicine

## 2012-12-09 DIAGNOSIS — M549 Dorsalgia, unspecified: Secondary | ICD-10-CM

## 2012-12-09 NOTE — Progress Notes (Signed)
Physical Therapy Treatment Patient Details  Name: Shannon Romero MRN: [id number] Date of Birth: 10/09/1945  Today's Date: 12/09/2012 Time: 7829-5621 PT Time Calculation (min): 55 min Charge; Manual [phone], TE [phone], Estim 1015-1030  Visit#: 10 of 12  Re-eval: 12/15/12 Assessment Diagnosis: Low back pain Next MD Visit: Dr. Lovell Sheehan October 17 Prior Therapy: None  Authorization: Medicare  Authorization Time Period:    Authorization Visit#: 10 of 10   Subjective: Symptoms/Limitations Symptoms: I am in a little pain in buttock. feeling better than I did last session.  Pt reported she did not feel a lot of relief following [place] last session. Pain Assessment Currently in Pain?: Yes Pain Score: 3  Pain Location: Buttocks  Precautions/Restrictions  Precautions Precautions: None  Exercise/Treatments Stretches Piriformis Stretch: 2 reps;30 seconds Seated Other Seated Lumbar Exercises: anterior/posterior hip rotation Supine Bridge: 15 reps Straight Leg Raise: 10 reps Isometric Hip Flexion: 5 reps;3 seconds Sidelying Clam: 5 reps;Limitations Clam Limitations: 10" holds with tactile cueing for right mm activation Hip Abduction: 10 reps  Modalities Modalities: Electrical Stimulation Manual Therapy Other Manual Therapy: SI within alignment, no MET required Electrical Stimulation Electrical Stimulation Location: lumbar region Electrical Stimulation Action: interferential  to decrease pain Electrical Stimulation Parameters: IFES hi/lo sweep prone position Electrical Stimulation Goals: Pain  Physical Therapy Assessment and Plan PT Assessment and Plan Clinical Impression Statement: Added core and LE strengthening exercises and continued with stretches to improve flexibilty. Pt with SI alignment intact, no MET required this session. Pt explained importance in core activation to assure SI alignment.  Ended session with estim for pain control, pt reorted pain free. PT Plan:  Gcode complete based on clinicial judgement, have pt complete LEFS next session.  Check SI alignment, manual therapy to LLE, continue with core, stretches, and balance exercises once pain is reduced.     Goals    Problem List Patient Active Problem List   Diagnosis Date Noted  . Flank pain 09/30/2012  . Chest pain, atypical 02/10/2012  . 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 NECK PAIN 07/10/2008  . DIABETES MELLITUS, TYPE II 10/12/2007  . OBESITY 10/12/2007  . ANXIETY 10/12/2007  . DEPRESSION 10/12/2007  . HYPERTENSION 10/12/2007  . GERD 10/12/2007  . Back pain with radiation 10/12/2007    PT - End of Session Activity Tolerance: Patient tolerated treatment well General Behavior During Therapy: [facility] for tasks assessed/performed  GP Functional Assessment Tool Used: based on clinicial observation  Functional Limitation: Self care Self Care Current Status (U1324): At least 20 percent but less than 40 percent impaired, limited or restricted Self Care Goal Status (M0102): At least 1 percent but less than 20 percent impaired, limited or restricted  Juel Burrow 12/09/2012, 1:59 PM

## 2012-12-14 ENCOUNTER — Ambulatory Visit (HOSPITAL_COMMUNITY)
Admission: RE | Admit: 2012-12-14 | Discharge: 2012-12-14 | Disposition: A | Discharge status: Home / Self Care | Payer: Medicare Other | Source: Ambulatory Visit | Attending: Neurosurgery | Admitting: Neurosurgery

## 2012-12-14 DIAGNOSIS — M545 Low back pain, unspecified: Secondary | ICD-10-CM | POA: Insufficient documentation

## 2012-12-14 DIAGNOSIS — IMO0001 Reserved for inherently not codable concepts without codable children: Secondary | ICD-10-CM | POA: Insufficient documentation

## 2012-12-14 DIAGNOSIS — I10 Essential (primary) hypertension: Secondary | ICD-10-CM | POA: Insufficient documentation

## 2012-12-14 DIAGNOSIS — E119 Type 2 diabetes mellitus without complications: Secondary | ICD-10-CM | POA: Insufficient documentation

## 2012-12-14 NOTE — Progress Notes (Signed)
Physical Therapy Treatment Patient Details  Name: NALY SCHWANZ MRN: [id number] Date of Birth: [birth year]  Today's Date: 12/14/2012 Time: 1102-1148 PT Time Calculation (min): 46 min Visit#: 11 of 12  Re-eval: 12/15/12 Authorization: Medicare  Authorization Visit#: 11 of 20  Charges:  therex 25', IFES/MHP 15'  Subjective: Symptoms/Limitations Symptoms: Pt states last treatment helped more than the previous one.  PT reports pain remained low (around 3) from last visit up until yesterday.  Currently 6/10 Pain Assessment Currently in Pain?: Yes Pain Score: 6  Pain Location: Buttocks Pain Orientation: Left   Exercise/Treatments Supine Bridge: 15 reps Straight Leg Raise: 15 reps Isometric Hip Flexion: Limitations Isometric Hip Flexion Limitations: 10 reps 3 sec holds Sidelying Clam: Limitations Clam Limitations: 10 reps 10 seconds each Hip Abduction: 15 reps Prone  Single Arm Raise: 5 reps Straight Leg Raise: 5 reps Other Prone Lumbar Exercises: heelsqueeze 10X5" holds    Modalities Modalities: Electrical Stimulation;Moist Heat Manual Therapy Myofascial Release: Prone position to Lt and Rt gluteal region, hamstrings, IT band, and adductors to decrease fascial restrictions with soft tissue massage 5' only due to inabiltiy to tolerate Moist Heat Therapy Number Minutes Moist Heat: 15 Minutes Moist Heat Location: Other (comment) Electrical Stimulation Electrical Stimulation Location: lumbar region Electrical Stimulation Action: IFES to decrease pain Electrical Stimulation Parameters: IFES hi/lo sweep prone position intensity 18-22 Volts Electrical Stimulation Goals: Pain  Physical Therapy Assessment and Plan PT Assessment and Plan Clinical Impression Statement: Added prone stab exercises; cues needed to perform SL clams in correct form and stability due to tendency to rock backward.  Pt with intact SI alignment without need for MET.  Gentle STM to bilateral Hips/glutes,  however unable to tolerate into hamstrings , Rt more sensitive than Lt.  OVerall pain reduction at end of session due to IFES and MHP in prone.  PT Plan: Continue to Check SI alignment, manual therapy to LLE, continue with core, stretches, and balance exercises once pain is reduced.   Re-evaluate next visit; complete LEFS.     Problem List Patient Active Problem List   Diagnosis Date Noted  . Flank pain 09/30/2012  . Chest pain, atypical 02/10/2012  . 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 NECK PAIN 07/10/2008  . DIABETES MELLITUS, TYPE II 10/12/2007  . OBESITY 10/12/2007  . ANXIETY 10/12/2007  . DEPRESSION 10/12/2007  . HYPERTENSION 10/12/2007  . GERD 10/12/2007  . Back pain with radiation 10/12/2007    PT - End of Session Activity Tolerance: Patient tolerated treatment well General Behavior During Therapy: [facility] for tasks assessed/performed   Lurena Nida, PTA/CLT 12/14/2012, 11:56 AM

## 2012-12-16 ENCOUNTER — Ambulatory Visit (HOSPITAL_COMMUNITY)
Admission: RE | Admit: 2012-12-16 | Discharge: 2012-12-16 | Disposition: A | Discharge status: Home / Self Care | Payer: Medicare Other | Source: Ambulatory Visit | Attending: Family Medicine | Admitting: Family Medicine

## 2012-12-16 DIAGNOSIS — M549 Dorsalgia, unspecified: Secondary | ICD-10-CM

## 2012-12-16 NOTE — Progress Notes (Signed)
Physical Therapy Re-evaluation/treatment  Patient Details  Name: Shannon Romero MRN: [id number] Date of Birth: 1946/03/21  Today's Date: 12/16/2012 Time: 1106-1210 PT Time Calculation (min): 64 min Charge; Self care [phone], [phone], MMT/ROM [phone], Manual [phone], Estim with MHP [phone],               Visit#: 12 of 24  Re-eval: 01/13/13 Assessment Diagnosis: Low back pain Next MD Visit: Dr. Lovell Sheehan October 17 Prior Therapy: None  Authorization: Medicare    Authorization Time Period:    Authorization Visit#: 12 of 20   Subjective Symptoms/Limitations Symptoms: Pt stated it rained yesterday with increased pain following change in weather.  Reports back is feeling better but continues to have pain Bil buttocks, currently 9/10. How long can you sit comfortably?: 30 minutes (was 60 minutes) How long can you stand comfortably?: 30 minmutes (was 15 minutes) How long can you walk comfortably?: 15 minutes (was 10 minutes with SPC)  Precautions/Restrictions  Precautions Precautions: None  Sensation/Coordination/Flexibility/Functional Tests Functional Tests Functional Tests: Lower Extremity Functional Scale: 39/80 Functional Tests: + FABER  Assessment RLE Strength Right Hip Flexion: 4/5 (was 3+/5) Right Hip Extension: 3+/5 (was 3/5) Right Hip ABduction: 4/5 (was 3/5) LLE Strength Left Hip Flexion: 4/5 (was 3-/5) Left Hip Extension: 3+/5 (was 3/5) Left Hip ABduction: 3+/5 (was 2+/5) Lumbar AROM Lumbar Flexion: decreased 50% (was decreased 90%) Lumbar Extension: decreased 30% (was decreased 50%) Palpation Palpation: moderate fascial restrictions with muscle spasms to Bil gluteal region and coccyx region. atrophy to Lt gluteal muscle, pain to lumbar paraspinals  Exercise/Treatments Manual Therapy Myofascial Release: Prone position to Lt and Rt gluteal region, hamstrings, IT band, and adductors to decrease fascial restrictions with soft tissue massage 5' only due to  inabiltiy to tolerate Other Manual Therapy: SI within alignment, no MET required Moist Heat Therapy Number Minutes Moist Heat: 15 Minutes Moist Heat Location: Other (comment) (gluteal) Electrical Stimulation Electrical Stimulation Location: gluteal region Electrical Stimulation Action: to reduce pain Electrical Stimulation Parameters: IFES hi/lo sweep prone position with MHP Electrical Stimulation Goals: Pain  Physical Therapy Assessment and Plan PT Assessment and Plan Clinical Impression Statement: Re-eval complete:  Shannon Romero has had 12 OPPT sessions over 4 weeks  with the following findings:  Pt independent with HEP daily,  Reports she has improved by 50% since day 1.  Overall improved hip strengthen, improve lumbar ROM with pain.  Pt improving core activation with SI intact and no MET required.  Pt reported decrease in radicular symptoms by 50%.  Improved Bil LE proprioception to reduce risk of falls.  Pt continues to have moderate fascial restrictions in gluteal, hamstring and adductor regions and limited by pain. PT Plan: Recommend continuing OPPT for 4 more weeks to addresses goals unmet.  Cotninue manual therapy to reduce fascial restrictions, core and LE strengthening, stretches to improve ROM and balance activities to reduce risk of injury once pain is reduced.      Goals Home Exercise Program Pt/caregiver will Perform Home Exercise Program: Independently: Met PT Short Term Goals Time to Complete Short Term Goals: 3 weeks PT Short Term Goal 1: Pt will report decreased radicular pain by 50% to LLE while sitting for improved QOL.  Met PT Short Term Goal 2: Pt will improve BLE strength by 1 muscle grade for greater ease with walking 10 minutes.  Met PT Short Term Goal 3: Pt will be independent with core muscle activation to decrease raidcular symptoms. : Progressing toward goal PT Short Term Goal 4: Pt  will improve proprioception and demonstrate Rt and Lt SLS x10 seconds on solid  surface: Met PT Short Term Goal 5: Pt will report pain less than 3/10 for 75% of her day for improved QOL.  Progressing toward goal PT Long Term Goals Time to Complete Long Term Goals:  (6 weeks) PT Long Term Goal 1: Pt will improve her core strength to [facility] in order to sit with appropriate posture x10 minutes without radicular symptoms. Progressing toward goal PT Long Term Goal 2: Pt will improve her lumbar flexion by 50% for greater ease with sit to stand.  Progressing toward goal Long Term Goal 3: Pt will improve her BLE strength to [facility] in order to ambulate x30 minutes with improved ease to continue with household activities.  Progressing toward goal Long Term Goal 4: Pt will be educated on proper body mechanics with sitting and standing to decrease risk of further injury Progressing toward goal PT Long Term Goal 5: Pt will improve her LEFS to 40/80 for improved QOL. Progressing toward goal  Problem List Patient Active Problem List   Diagnosis Date Noted  . Flank pain 09/30/2012  . Chest pain, atypical 02/10/2012  . 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 NECK PAIN 07/10/2008  . DIABETES MELLITUS, TYPE II 10/12/2007  . OBESITY 10/12/2007  . ANXIETY 10/12/2007  . DEPRESSION 10/12/2007  . HYPERTENSION 10/12/2007  . GERD 10/12/2007  . Back pain with radiation 10/12/2007    PT - End of Session Activity Tolerance: Patient tolerated treatment well General Behavior During Therapy: [facility] for tasks assessed/performed  GP Functional Assessment Tool Used: based on clinicial observation and LEFS 29/80= 37% Functional Limitation: Self care Self Care Current Status (Z6109): At least 40 percent but less than 60 percent impaired, limited or restricted Self Care Goal Status (U0454): At least 1 percent but less than 20 percent impaired, limited or restricted  Juel Burrow 12/16/2012, 12:16 PM

## 2012-12-19 ENCOUNTER — Ambulatory Visit (HOSPITAL_COMMUNITY)
Admission: RE | Admit: 2012-12-19 | Discharge: 2012-12-19 | Disposition: A | Discharge status: Home / Self Care | Payer: Medicare Other | Source: Ambulatory Visit | Attending: Family Medicine | Admitting: Family Medicine

## 2012-12-19 NOTE — Progress Notes (Signed)
Physical Therapy Treatment Patient Details  Name: Shannon Romero MRN: [id number] Date of Birth: 06-29-1945  Today's Date: 12/19/2012 Time: 1106-1204 PT Time Calculation (min): 58 min  Visit#: 13 of 24  Re-eval: 01/13/13 Authorization: Medicare  Authorization Visit#: 13 of 20  Charges:  therex [id number] (24), manual [phone] (12'), MHP with IFES [phone] (15')  Subjective: Symptoms/Limitations Symptoms: Pt states she was doing good until it started raining again.  Currently 5/10 central LB and into posterior LE's.   Exercise/Treatments Stretches Piriformis Stretch: 2 reps;30 seconds Seated Other Seated Lumbar Exercises: anterior/posterior hip rotation Supine Bridge: 20 reps Straight Leg Raise: 15 reps Isometric Hip Flexion: Limitations Isometric Hip Flexion Limitations: 10 reos 3 sec holds Sidelying Clam: Limitations Clam Limitations: 10 reps 10 seconds each Hip Abduction: 15 reps Prone  Single Arm Raise: 5 reps Straight Leg Raise: 5 reps Other Prone Lumbar Exercises: heelsqueeze 10X5" holds    Modalities Modalities: Electrical Stimulation;Moist Heat Manual Therapy Manual Therapy: Other (comment) Myofascial Release: Prone position to Bilateral glutes, hamstring and ITB.  Rt ITB tightest.  Bilateral LE distraction Moist Heat Therapy Number Minutes Moist Heat: 15 Minutes Moist Heat Location: Other (comment) (gluteal) Electrical Stimulation Electrical Stimulation Location: gluteal region Electrical Stimulation Action: pain reduction Electrical Stimulation Parameters: IFES hi/lo sweep prone position with MHP Electrical Stimulation Goals: Pain  Physical Therapy Assessment and Plan PT Assessment and Plan Clinical Impression Statement: Pt able to complete all exercises without diffiuculty; continues to require vc's with side lying clams due to decreased stabiltiy and tendency to rock backward with decreased control.    No MET required today.  Added distraction to BLE's  for lumbar while in prone with good results. PT Plan: Continue manual therapy to reduce fascial restrictions, core and LE strengthening, stretches to improve ROM and balance activities to reduce risk of injury once pain is reduced.       Problem List Patient Active Problem List   Diagnosis Date Noted  . Flank pain 09/30/2012  . Chest pain, atypical 02/10/2012  . 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 NECK PAIN 07/10/2008  . DIABETES MELLITUS, TYPE II 10/12/2007  . OBESITY 10/12/2007  . ANXIETY 10/12/2007  . DEPRESSION 10/12/2007  . HYPERTENSION 10/12/2007  . GERD 10/12/2007  . Back pain with radiation 10/12/2007    PT - End of Session Activity Tolerance: Patient tolerated treatment well General Behavior During Therapy: [facility] for tasks assessed/performed   Lurena Nida, PTA/CLT 12/19/2012, 11:50 AM

## 2012-12-21 ENCOUNTER — Ambulatory Visit (HOSPITAL_COMMUNITY)
Admission: RE | Admit: 2012-12-21 | Discharge: 2012-12-21 | Disposition: A | Discharge status: Home / Self Care | Payer: Medicare Other | Source: Ambulatory Visit | Attending: Family Medicine | Admitting: Family Medicine

## 2012-12-21 DIAGNOSIS — M549 Dorsalgia, unspecified: Secondary | ICD-10-CM

## 2012-12-21 NOTE — Progress Notes (Signed)
Physical Therapy Treatment Patient Details  Name: Shannon Romero MRN: [id number] Date of Birth: 03/16/1946  Today's Date: 12/21/2012 Time: 1023-1108 PT Time Calculation (min): 45 min Charges: TE: 2952-8413 Manual: 2440-1027 Ice: 2536-6440 Visit#: 14 of 24  Re-eval: 01/13/13 Assessment Diagnosis: Low back pain Next MD Visit: Dr. Lovell Sheehan October 17 Prior Therapy: None  Authorization: Medicare  Authorization Time Period:    Authorization Visit#: 14 of 20   Subjective: Symptoms/Limitations Symptoms: Pt reports that she is more aware of her posture.  She still feels the weather has a lot to do with her pain. States she has been working on her exercises some of the time.  Pain Assessment Currently in Pain?: Yes Pain Score: 5  Pain Location: Hip Pain Orientation: Left;Posterior  Precautions/Restrictions     Exercise/Treatments Aerobic Elliptical: NuStep x10 minutes seat 7 for LE strengthening.  Modalities Modalities: Cryotherapy Manual Therapy Myofascial Release: Rt S/L and prone to Lt: obturator intermus and Lt gluteal region externally. Prone to Bil gluteal region and coccyx region to decrease fascial restrictions to improve mobiilty.  Physical Therapy Assessment and Plan PT Assessment and Plan Clinical Impression Statement: Continued w/NuStep to improve LE strengthening today.  Added manual techniques after manual to decrease fascial restrictions to external pelvic floor musculature.  Pt with significant muscle spasms and fascial restrictions to pelvic floor region, especially to Lt obturator internus which may be cauing her increased numbness to her LLE.  Pt had 25% reduction in spasms at end of treatment and decreased pain after ice was applied.  PT Plan: Continue manual therapy to reduce fascial restrictions, core and LE strengthening, stretches to improve ROM and balance activities to reduce risk of injury once pain is reduced.      Goals Home Exercise  Program Pt/caregiver will Perform Home Exercise Program: Independently PT Goal: Perform Home Exercise Program - Progress: Met PT Short Term Goals Time to Complete Short Term Goals: 3 weeks PT Short Term Goal 1: Pt will report decreased radicular pain by 50% to LLE while sitting for improved QOL.  PT Short Term Goal 1 - Progress: Met PT Short Term Goal 2: Pt will improve BLE strength by 1 muscle grade for greater ease with walking 10 minutes.  PT Short Term Goal 2 - Progress: Met PT Short Term Goal 3: Pt will be independent with core muscle activation to decrease raidcular symptoms.  PT Short Term Goal 3 - Progress: Met PT Short Term Goal 4: Pt will improve proprioception and demonstrate Rt and Lt SLS x10 seconds on solid surface PT Short Term Goal 4 - Progress: Met PT Short Term Goal 5: Pt will report pain less than 3/10 for 75% of her day for improved QOL.  PT Short Term Goal 5 - Progress: Progressing toward goal PT Long Term Goals Time to Complete Long Term Goals:  (6 weeks) PT Long Term Goal 1: Pt will improve her core strength to [facility] in order to sit with appropriate posture x10 minutes without radicular symptoms.  PT Long Term Goal 1 - Progress: Met PT Long Term Goal 2: Pt will improve her lumbar flexion by 50% for greater ease with sit to stand.  PT Long Term Goal 2 - Progress: Met Long Term Goal 3: Pt will improve her BLE strength to [facility] in order to ambulate x30 minutes with improved ease to continue with household activities.  Long Term Goal 3 Progress: Progressing toward goal Long Term Goal 4: Pt will be educated on proper body mechanics with  sitting and standing to decrease risk of further injury Long Term Goal 4 Progress: Met PT Long Term Goal 5: Pt will improve her LEFS to 40/80 for improved QOL.  Long Term Goal 5 Progress: Progressing toward goal  Problem List Patient Active Problem List   Diagnosis Date Noted  . Flank pain 09/30/2012  . Chest pain, atypical 02/10/2012  .  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 NECK PAIN 07/10/2008  . DIABETES MELLITUS, TYPE II 10/12/2007  . OBESITY 10/12/2007  . ANXIETY 10/12/2007  . DEPRESSION 10/12/2007  . HYPERTENSION 10/12/2007  . GERD 10/12/2007  . Back pain with radiation 10/12/2007    PT - End of Session Activity Tolerance: Patient tolerated treatment well General Behavior During Therapy: [facility] for tasks assessed/performed  GP    Rhett Mutschler, MPT, ATC 12/21/2012, 11:05 AM

## 2012-12-23 ENCOUNTER — Ambulatory Visit (HOSPITAL_COMMUNITY)
Admission: RE | Admit: 2012-12-23 | Discharge: 2012-12-23 | Disposition: A | Discharge status: Home / Self Care | Payer: Medicare Other | Source: Ambulatory Visit | Attending: Family Medicine | Admitting: Family Medicine

## 2012-12-23 DIAGNOSIS — M549 Dorsalgia, unspecified: Secondary | ICD-10-CM

## 2012-12-23 NOTE — Progress Notes (Signed)
Physical Therapy Treatment Patient Details  Name: Shannon Romero MRN: [id number] Date of Birth: 09/29/1945  Today's Date: 12/23/2012 Time: 4098-1191 PT Time Calculation (min): 41 min Charge; TE 4782-9562, Manual 1308-6578  Visit#: 15 of 24  Re-eval: 01/13/13 Assessment Diagnosis: Low back pain Next MD Visit: Dr. Lovell Sheehan October 17 Prior Therapy: None  Authorization: Medicare  Authorization Time Period: Gcode complete 12th visit  Authorization Visit#: 15 of 20   Subjective: Symptoms/Limitations Symptoms: Pt stated increased BLE hip pain with radicular symptoms to posterior thigh, pain scale 8/10 today.  Pt feels the weather has a lot to do with her increased pain.   Pain Assessment Currently in Pain?: Yes Pain Score: 8  Pain Location: Hip Pain Orientation: Right;Left;Posterior Pain Radiating Towards: BLE to posterior thigh  Precautions/Restrictions  Precautions Precautions: None  Exercise/Treatments Aerobic Elliptical: NuStep x10 minutes seat 7 for LE strengthening.  Manual Therapy Myofascial Release: Prone position MFR f/b STM to hamstrings, IT Band, adductors lumbar, coccys and gluteal region to reduce fascial restrictions and spasms to reduce pain.  Physical Therapy Assessment and Plan PT Assessment and Plan Clinical Impression Statement: Session focus on manual techniques to reduce fascial restrictions and pain and therex for LE strengthening.  Pt reported pain reduced following riding NuStep for 10 minutes initially this session.  Pt continues to present with significant muscle spasms and fascial restircitons to lumbar, Bll gluteal, hamstrings, iliotibial band and adductor, manual techniques complete to reduce restrictions with significant pain scale reduction stated by pain at end of session.  Pt rated pain scale 1/10.  Pt encouraged to continue wtih HEP for maximum benefits and to drink extra water to reduce risk of headaches. PT Plan: Continue manual therapy to  reduce fascial restrictions, core and LE strengthening, stretches to improve ROM and balance activities to reduce risk of injury once pain is reduced.      Goals Home Exercise Program Pt/caregiver will Perform Home Exercise Program: Independently PT Short Term Goals Time to Complete Short Term Goals: 3 weeks PT Short Term Goal 1: Pt will report decreased radicular pain by 50% to LLE while sitting for improved QOL.  PT Short Term Goal 2: Pt will improve BLE strength by 1 muscle grade for greater ease with walking 10 minutes.  PT Short Term Goal 3: Pt will be independent with core muscle activation to decrease raidcular symptoms.  PT Short Term Goal 4: Pt will improve proprioception and demonstrate Rt and Lt SLS x10 seconds on solid surface PT Short Term Goal 5: Pt will report pain less than 3/10 for 75% of her day for improved QOL.  PT Long Term Goals Time to Complete Long Term Goals:  (6 weeks) PT Long Term Goal 1: Pt will improve her core strength to [facility] in order to sit with appropriate posture x10 minutes without radicular symptoms.  PT Long Term Goal 2: Pt will improve her lumbar flexion by 50% for greater ease with sit to stand.  Long Term Goal 3: Pt will improve her BLE strength to [facility] in order to ambulate x30 minutes with improved ease to continue with household activities.  Long Term Goal 4: Pt will be educated on proper body mechanics with sitting and standing to decrease risk of further injury PT Long Term Goal 5: Pt will improve her LEFS to 40/80 for improved QOL.   Problem List Patient Active Problem List   Diagnosis Date Noted  . Flank pain 09/30/2012  . Chest pain, atypical 02/10/2012  . 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  NECK PAIN 07/10/2008  . DIABETES MELLITUS, TYPE II 10/12/2007  . OBESITY 10/12/2007  . ANXIETY 10/12/2007  . DEPRESSION 10/12/2007  . HYPERTENSION 10/12/2007  . GERD 10/12/2007  . Back pain with radiation 10/12/2007    PT - End of Session Activity  Tolerance: Patient tolerated treatment well General Behavior During Therapy: [facility] for tasks assessed/performed  GP    Juel Burrow 12/23/2012, 1:22 PM

## 2012-12-26 ENCOUNTER — Ambulatory Visit (HOSPITAL_COMMUNITY)
Admission: RE | Admit: 2012-12-26 | Discharge: 2012-12-26 | Disposition: A | Discharge status: Home / Self Care | Payer: Medicare Other | Source: Ambulatory Visit | Attending: Family Medicine | Admitting: Family Medicine

## 2012-12-26 DIAGNOSIS — M549 Dorsalgia, unspecified: Secondary | ICD-10-CM

## 2012-12-26 NOTE — Progress Notes (Signed)
Physical Therapy Treatment Patient Details  Name: Shannon Romero MRN: [id number] Date of Birth: Nov 25, 1945  Today's Date: 12/26/2012 Time: 4098-1191 PT Time Calculation (min): 40 min Charges:  TE: [phone] Visit#: 16 of 24  Re-eval: 01/13/13 Assessment Diagnosis: Low back pain Next MD Visit: Dr. Lovell Sheehan October 17 Prior Therapy: None  Authorization: Medicare  Authorization Time Period: Gcode complete 12th visit  Authorization Visit#: 15 of 20   Subjective: Symptoms/Limitations Symptoms: Pt reports that she feels that she gets better some days and other days she feels like she just has to live what she has.  States she is walking 35 mminutes w.her [facility]  Precautions/Restrictions     Exercise/Treatments Mobility/Balance        Stretches Quadruped Mid Back Stretch: 3 reps;60 seconds;Limitations Quadruped Mid Back Stretch Limitations: w/green ball Piriformis Stretch: 3 reps;30 seconds;Limitations Piriformis Stretch Limitations: seated BLE Aerobic Elliptical: NuStep x10 minutes, Hills #3, Level 3 seat 7 for LE strengthening. Seated Other Seated Lumbar Exercises: Heel Roll outs 10x10 sec w/PT facilaiton for knee adduction Supine Bridge: 10 reps;5 seconds;Limitations Bridge Limitations: w/Towel squeeze Sidelying Hip Abduction: 20 reps;Limitations Hip Abduction Limitations: BLE Other Sidelying Lumbar Exercises: Hip adduction 10 reps BLE Quadruped Single Arm Raise: Right;Left;5 reps     Physical Therapy Assessment and Plan PT Assessment and Plan Clinical Impression Statement: Added stretching to improve muscle length and flexibility to core and pelvic floor to decrease pain.  Added hip adduction strengthing to improve overall core and hip strength to decrease pain.  PT Plan: Add prone lumbar exercises as needed and dead bug next visit.  Re-eval on Friday with [facility] Exercise Program Pt/caregiver will Perform Home Exercise Program: Independently PT Goal:  Perform Home Exercise Program - Progress: Met PT Short Term Goals Time to Complete Short Term Goals: 3 weeks PT Short Term Goal 1: Pt will report decreased radicular pain by 50% to LLE while sitting for improved QOL.  PT Short Term Goal 1 - Progress: Met PT Short Term Goal 2: Pt will improve BLE strength by 1 muscle grade for greater ease with walking 10 minutes.  PT Short Term Goal 2 - Progress: Met PT Short Term Goal 3: Pt will be independent with core muscle activation to decrease raidcular symptoms.  PT Short Term Goal 3 - Progress: Met PT Short Term Goal 4: Pt will improve proprioception and demonstrate Rt and Lt SLS x10 seconds on solid surface PT Short Term Goal 4 - Progress: Met PT Short Term Goal 5: Pt will report pain less than 3/10 for 75% of her day for improved QOL.  PT Short Term Goal 5 - Progress: Met PT Long Term Goals Time to Complete Long Term Goals:  (6 weeks) PT Long Term Goal 1: Pt will improve her core strength to [facility] in order to sit with appropriate posture x10 minutes without radicular symptoms.  PT Long Term Goal 1 - Progress: Met PT Long Term Goal 2: Pt will improve her lumbar flexion by 50% for greater ease with sit to stand.  PT Long Term Goal 2 - Progress: Met Long Term Goal 3: Pt will improve her BLE strength to [facility] in order to ambulate x30 minutes with improved ease to continue with household activities.  (w/SPC) Long Term Goal 3 Progress: Met Long Term Goal 4: Pt will be educated on proper body mechanics with sitting and standing to decrease risk of further injury Long Term Goal 4 Progress: Met PT Long Term Goal 5:  Pt will improve her LEFS to 40/80 for improved QOL.  Long Term Goal 5 Progress: Progressing toward goal  Problem List Patient Active Problem List   Diagnosis Date Noted  . Flank pain 09/30/2012  . Chest pain, atypical 02/10/2012  . 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 NECK PAIN 07/10/2008  . DIABETES MELLITUS, TYPE II 10/12/2007  . OBESITY 10/12/2007   . ANXIETY 10/12/2007  . DEPRESSION 10/12/2007  . HYPERTENSION 10/12/2007  . GERD 10/12/2007  . Back pain with radiation 10/12/2007    PT - End of Session Activity Tolerance: Patient tolerated treatment well General Behavior During Therapy: [facility] for tasks assessed/performed PT Plan of Care PT Patient Instructions: Encouraged pt to join [facility] and provided with paper to join.  encouraged with pool exercise and f/u bi-monthly massage therapy to decrease pain.  Consulted and Agree with Plan of Care: Patient  GP    Clinten Howk, MPT, ATC 12/26/2012, 10:15 AM

## 2012-12-28 ENCOUNTER — Ambulatory Visit (HOSPITAL_COMMUNITY)
Admission: RE | Admit: 2012-12-28 | Discharge: 2012-12-28 | Disposition: A | Discharge status: Home / Self Care | Payer: Medicare Other | Source: Ambulatory Visit | Attending: Family Medicine | Admitting: Family Medicine

## 2012-12-28 NOTE — Progress Notes (Signed)
Physical Therapy Treatment Patient Details  Name: Shannon Romero MRN: [id number] Date of Birth: [birth year]  Today's Date: 12/28/2012 Time: 7829-5621 PT Time Calculation (min): 42 min  Visit#: 17 of 24  Re-eval: 01/13/13 Authorization: Medicare  Authorization Time Period: Roselind Rily complete 12th visit  Authorization Visit#: 17 of 22  Charges:  therex 40'  Subjective: Symptoms/Limitations Symptoms: Pt states her legs ache when the weather is overcast.  States some aching today but no real pain.   Exercise/Treatments Stretches Piriformis Stretch: 3 reps;Limitations;20 seconds Piriformis Stretch Limitations: seated BLE Aerobic Elliptical: NuStep x10 minutes, Hills #3, Level 3 seat 7 for LE strengthening. Seated Other Seated Lumbar Exercises: Heel Roll outs 10x10 sec w/PT facilaiton for knee adduction Supine Bridge: 15 reps;Limitations Bridge Limitations: ball squee Sidelying Hip Abduction: 20 reps Hip Abduction Limitations: BLE Other Sidelying Lumbar Exercises: Hip adduction 15 reps BLE Prone  Single Arm Raise: 10 reps Straight Leg Raise: 10 reps Opposite Arm/Leg Raise: 5 reps Other Prone Lumbar Exercises: heelsqueezes 10X5"     Physical Therapy Assessment and Plan PT Assessment and Plan Clinical Impression Statement: Progressed therex with verbal and tactile cues for form.  pt able to complete all without increase in pain.  Held modalities today due to lower pain reported. PT Plan: Add dead bug next visit.  Re-eval on Friday with Misty Stanley     Problem List Patient Active Problem List   Diagnosis Date Noted  . Flank pain 09/30/2012  . Chest pain, atypical 02/10/2012  . 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 NECK PAIN 07/10/2008  . DIABETES MELLITUS, TYPE II 10/12/2007  . OBESITY 10/12/2007  . ANXIETY 10/12/2007  . DEPRESSION 10/12/2007  . HYPERTENSION 10/12/2007  . GERD 10/12/2007  . Back pain with radiation 10/12/2007    PT - End of Session Activity Tolerance: Patient  tolerated treatment well General Behavior During Therapy: [facility] for tasks assessed/performed   Lurena Nida, PTA/CLT 12/28/2012, 10:22 AM

## 2012-12-30 ENCOUNTER — Ambulatory Visit (HOSPITAL_COMMUNITY)
Admission: RE | Admit: 2012-12-30 | Discharge: 2012-12-30 | Disposition: A | Discharge status: Home / Self Care | Payer: Medicare Other | Source: Ambulatory Visit | Attending: Family Medicine | Admitting: Family Medicine

## 2012-12-30 DIAGNOSIS — M549 Dorsalgia, unspecified: Secondary | ICD-10-CM

## 2012-12-30 NOTE — Progress Notes (Addendum)
Physical Therapy Treatment Patient Details  Name: Shannon Romero MRN: [id number] Date of Birth: 05/19/1945  Today's Date: 12/30/2012 Time: 1105-1202 PT Time Calculation (min): 57 min Charges: 1 traction TE: 1135-1202 Visit#: 18 of 24  Re-eval: 01/13/13    Authorization: Medicare  Authorization Time Period: Roselind Rily complete 12th visit  Authorization Visit#: 18 of 22   Subjective: Symptoms/Limitations Symptoms: Pt states that she feels it is the weather that is really bothering her leg.   Pain Assessment Currently in Pain?: Yes Pain Score: 6  Pain Location: Hip Pain Orientation: Left  Precautions/Restrictions     Exercise/Treatments Aerobic Elliptical: NuStep x8 minutes, Hills #3, Level 5 seat 7 for LE strengthening. Standing Scapular Retraction: Left;15 reps;Theraband Theraband Level (Scapular Retraction): Level 3 (Green) Row: 15 reps;Theraband Theraband Level (Row): Level 3 (Green) Shoulder Extension: Both;15 reps Theraband Level (Shoulder Extension): Level 3 (Green) Supine Bent Knee Raise: 20 reps Bridge: 20 reps Straight Leg Raise: 15 reps;3 seconds Sidelying Hip Abduction: 20 reps Hip Abduction Limitations: BLE Other Sidelying Lumbar Exercises: Hip adduction 15 reps BLE 3 sec holds  Modalities Modalities: Traction Traction Type of Traction: Lumbar Min (lbs): 0 Max (lbs): 75 Hold Time: 60 Rest Time: 15 Time: 20 min.  25% rope pull   Physical Therapy Assessment and Plan PT Assessment and Plan Clinical Impression Statement: Discussed and pt agreeable to lumbar traction to decrease pain, initally had decreased radicular symptoms.  Pt reports symptoms return after exercising.  Added exercises today to improve hip and core strength and activity tolerance.  Pt with mn cueing for proper form and activation of gluteus medius.  PT Plan: Add dead bug next visit. F/u on traction  Goals Home Exercise Program Pt/caregiver will Perform Home Exercise Program:  Independently PT Goal: Perform Home Exercise Program - Progress: Met PT Short Term Goals Time to Complete Short Term Goals: 3 weeks PT Short Term Goal 1: Pt will report decreased radicular pain by 50% to LLE while sitting for improved QOL.  PT Short Term Goal 1 - Progress: Partly met PT Short Term Goal 2: Pt will improve BLE strength by 1 muscle grade for greater ease with walking 10 minutes.  PT Short Term Goal 2 - Progress: Met PT Short Term Goal 3: Pt will be independent with core muscle activation to decrease raidcular symptoms.  PT Short Term Goal 3 - Progress: Partly met PT Short Term Goal 4: Pt will improve proprioception and demonstrate Rt and Lt SLS x10 seconds on solid surface PT Short Term Goal 5: Pt will report pain less than 3/10 for 75% of her day for improved QOL.  PT Short Term Goal 5 - Progress: Progressing toward goal PT Long Term Goals Time to Complete Long Term Goals:  (6 weeks) PT Long Term Goal 1: Pt will improve her core strength to [facility] in order to sit with appropriate posture x10 minutes without radicular symptoms.  PT Long Term Goal 1 - Progress: Progressing toward goal PT Long Term Goal 2: Pt will improve her lumbar flexion by 50% for greater ease with sit to stand.  PT Long Term Goal 2 - Progress: Met Long Term Goal 3: Pt will improve her BLE strength to [facility] in order to ambulate x30 minutes with improved ease to continue with household activities.  (w/SPC) Long Term Goal 3 Progress: Met Long Term Goal 4: Pt will be educated on proper body mechanics with sitting and standing to decrease risk of further injury Long Term Goal 4 Progress: Met PT  Long Term Goal 5: Pt will improve her LEFS to 40/80 for improved QOL.  Long Term Goal 5 Progress: Progressing toward goal  Problem List Patient Active Problem List   Diagnosis Date Noted  . Flank pain 09/30/2012  . Chest pain, atypical 02/10/2012  . 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 NECK PAIN 07/10/2008  . DIABETES MELLITUS,  TYPE II 10/12/2007  . OBESITY 10/12/2007  . ANXIETY 10/12/2007  . DEPRESSION 10/12/2007  . HYPERTENSION 10/12/2007  . GERD 10/12/2007  . Back pain with radiation 10/12/2007    PT - End of Session Activity Tolerance: Patient tolerated treatment well General Behavior During Therapy: [facility] for tasks assessed/performed  GP    Tazia Illescas, MPT, ATC 12/30/2012, 11:59 AM

## 2013-01-03 ENCOUNTER — Ambulatory Visit (HOSPITAL_COMMUNITY)
Admission: RE | Admit: 2013-01-03 | Discharge: 2013-01-03 | Disposition: A | Discharge status: Home / Self Care | Payer: Medicare Other | Source: Ambulatory Visit | Attending: Family Medicine | Admitting: Family Medicine

## 2013-01-03 DIAGNOSIS — M549 Dorsalgia, unspecified: Secondary | ICD-10-CM

## 2013-01-03 NOTE — Progress Notes (Signed)
Physical Therapy Treatment Patient Details  Name: Shannon Romero MRN: [id number] Date of Birth: 06-06-1945  Today's Date: 01/03/2013 Time: 4098-1191 PT Time Calculation (min): 57 min Charge: TE 1518- 1552; Traction 1555-1615  Visit#: 19 of 24  Re-eval: 01/13/13 Assessment Diagnosis: Low back pain Next MD Visit: Dr. Lovell Sheehan October 17 Prior Therapy: None  Authorization: Medicare  Authorization Time Period: Gcode complete 12th visit  Authorization Visit#: 19 of 22   Subjective: Symptoms/Limitations Symptoms: Pt stateed she had decreased pain following traction last session for 2 days.  Reported LBP 5/10 today, Pain Assessment Currently in Pain?: Yes Pain Score: 5   Precautions/Restrictions  Precautions Precautions: None  Exercise/Treatments Aerobic Elliptical: NuStep x10 minutes, Hills #3, Level 5 seat 7 for LE strengthening. Standing Wall Slides: 5 reps;5 seconds Scapular Retraction: Left;15 reps;Theraband Theraband Level (Scapular Retraction): Level 3 (Green) Row: 15 reps;Theraband Theraband Level (Row): Level 3 (Green) Shoulder Extension: Both;15 reps Theraband Level (Shoulder Extension): Level 3 (Green) Sidelying Clam: Limitations Clam Limitations: 10 seconds each Hip Abduction: 20 reps Hip Abduction Limitations: BLE Other Sidelying Lumbar Exercises: Hip adduction 15 reps BLE 3 sec holds  Modalities Modalities: Traction Traction Type of Traction: Lumbar Min (lbs): 0 Max (lbs): 75 Hold Time: 60 Rest Time: 15 Time: 20 min.  25% rope pull   Physical Therapy Assessment and Plan PT Assessment and Plan Clinical Impression Statement: Progressed therex with cueing for technqiue and coordination to improve core stability, pt demonstrate correctly following cues.  Continues with lumbar intermittent traction to reduce radicular symptoms.  Pt reported pain free at end of session. PT Plan: Continue with current POC.  F/u with traction for reduced radicular pain  symptoms.      Goals Home Exercise Program Pt/caregiver will Perform Home Exercise Program: Independently PT Short Term Goals Time to Complete Short Term Goals: 3 weeks PT Short Term Goal 1: Pt will report decreased radicular pain by 50% to LLE while sitting for improved QOL.  PT Short Term Goal 2: Pt will improve BLE strength by 1 muscle grade for greater ease with walking 10 minutes.  PT Short Term Goal 3: Pt will be independent with core muscle activation to decrease raidcular symptoms.  PT Short Term Goal 3 - Progress: Progressing toward goal PT Short Term Goal 4: Pt will improve proprioception and demonstrate Rt and Lt SLS x10 seconds on solid surface PT Short Term Goal 5: Pt will report pain less than 3/10 for 75% of her day for improved QOL.  PT Long Term Goals Time to Complete Long Term Goals:  (6 weeks) PT Long Term Goal 1: Pt will improve her core strength to [facility] in order to sit with appropriate posture x10 minutes without radicular symptoms.  PT Long Term Goal 1 - Progress: Progressing toward goal PT Long Term Goal 2: Pt will improve her lumbar flexion by 50% for greater ease with sit to stand.  Long Term Goal 3: Pt will improve her BLE strength to [facility] in order to ambulate x30 minutes with improved ease to continue with household activities.  (w/SPC) Long Term Goal 4: Pt will be educated on proper body mechanics with sitting and standing to decrease risk of further injury PT Long Term Goal 5: Pt will improve her LEFS to 40/80 for improved QOL.   Problem List Patient Active Problem List   Diagnosis Date Noted  . Flank pain 09/30/2012  . Chest pain, atypical 02/10/2012  . 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 NECK PAIN 07/10/2008  . DIABETES MELLITUS,  TYPE II 10/12/2007  . OBESITY 10/12/2007  . ANXIETY 10/12/2007  . DEPRESSION 10/12/2007  . HYPERTENSION 10/12/2007  . GERD 10/12/2007  . Back pain with radiation 10/12/2007    PT - End of Session Activity Tolerance: Patient  tolerated treatment well General Behavior During Therapy: [facility] for tasks assessed/performed  GP    Juel Burrow 01/03/2013, 5:56 PM

## 2013-01-04 ENCOUNTER — Ambulatory Visit (HOSPITAL_COMMUNITY)
Admission: RE | Admit: 2013-01-04 | Discharge: 2013-01-04 | Disposition: A | Discharge status: Home / Self Care | Payer: Medicare Other | Source: Ambulatory Visit | Attending: Family Medicine | Admitting: Family Medicine

## 2013-01-04 NOTE — Progress Notes (Signed)
Physical Therapy Treatment Patient Details  Name: JAQUETTA CURRIER MRN: [id number] Date of Birth: 1945/07/13  Today's Date: 01/04/2013 Time: 1102-1204 PT Time Calculation (min): 62 min Visit#: 20 of 24  Re-eval: 01/13/13 Authorization: Medicare  Authorization Time Period: Roselind Rily complete 12th visit  Authorization Visit#: 20 of 22  Charges:  therex 1102-1138 (36'), traction 1140-1200 (20')  Subjective:  Pt states she is feeling great today.  No pain at all.  STates it was a little sore the day of her last treatment.  Also noted improved gait with decreased antalgia today.    Exercise/Treatments Aerobic Elliptical: NuStep x10 minutes, Hills #3, Level 5 seat 7 for LE strengthening. Standing Wall Slides: 10 reps;5 seconds Scapular Retraction: Left;15 reps;Theraband Theraband Level (Scapular Retraction): Level 3 (Green) Row: 15 reps;Theraband Theraband Level (Row): Level 3 (Green) Shoulder Extension: Both;15 reps Theraband Level (Shoulder Extension): Level 3 (Green) Sidelying Clam: 10 reps;Limitations Clam Limitations: 10 seconds each Hip Abduction: 20 reps Hip Abduction Limitations: BLE Other Sidelying Lumbar Exercises: Hip adduction 15 reps BLE 3 sec holds Prone  Single Arm Raise: 10 reps Straight Leg Raise: 10 reps Opposite Arm/Leg Raise: 5 reps Other Prone Lumbar Exercises: heelsqueezes 10X5"     Modalities Modalities: Traction Traction Type of Traction: Lumbar Min (lbs): 0 Max (lbs): 75 Hold Time: 60 Rest Time: 15 Time: 20 min.  25 degree rope pull   Physical Therapy Assessment and Plan PT Assessment and Plan Clinical Impression Statement: Pt with overall good form/stabiliazation with therex, only requiring cues with performing exercises more slowly and with form during side lying exercises.   Good results with traction with decreasing radicular pain.  Pt is pleased with progress. PT Plan: Continue with current POC.  F/u with traction for reduced radicular pain  symptoms.       Problem List Patient Active Problem List   Diagnosis Date Noted  . Flank pain 09/30/2012  . Chest pain, atypical 02/10/2012  . 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 NECK PAIN 07/10/2008  . DIABETES MELLITUS, TYPE II 10/12/2007  . OBESITY 10/12/2007  . ANXIETY 10/12/2007  . DEPRESSION 10/12/2007  . HYPERTENSION 10/12/2007  . GERD 10/12/2007  . Back pain with radiation 10/12/2007    PT - End of Session Activity Tolerance: Patient tolerated treatment well General Behavior During Therapy: [facility] for tasks assessed/performed   Lurena Nida, PTA/CLT 01/04/2013, 12:07 PM

## 2013-01-09 ENCOUNTER — Other Ambulatory Visit: Payer: Self-pay | Admitting: Family Medicine

## 2013-01-09 LAB — COMPREHENSIVE METABOLIC PANEL
AST: 19 U/L (ref 0–37)
Alkaline Phosphatase: 61 U/L (ref 39–117)
BUN: 15 mg/dL (ref 6–23)
Creat: 0.88 mg/dL (ref 0.50–1.10)
Glucose, Bld: 105 mg/dL — ABNORMAL HIGH (ref 70–99)
Total Bilirubin: 0.5 mg/dL (ref 0.3–1.2)

## 2013-01-09 LAB — HEMOGLOBIN A1C: Hgb A1c MFr Bld: 7 % — ABNORMAL HIGH (ref <5.7)

## 2013-01-09 LAB — CBC
HCT: 37.9 % (ref 36.0–46.0)
Hemoglobin: 12.3 g/dL (ref 12.0–15.0)
MCH: 26.1 pg (ref 26.0–34.0)
MCHC: 32.5 g/dL (ref 30.0–36.0)
RDW: 17 % — ABNORMAL HIGH (ref 11.5–15.5)

## 2013-01-10 ENCOUNTER — Ambulatory Visit (HOSPITAL_COMMUNITY)
Admission: RE | Admit: 2013-01-10 | Discharge: 2013-01-10 | Disposition: A | Discharge status: Home / Self Care | Payer: Medicare Other | Source: Ambulatory Visit | Attending: Family Medicine | Admitting: Family Medicine

## 2013-01-10 DIAGNOSIS — M549 Dorsalgia, unspecified: Secondary | ICD-10-CM

## 2013-01-10 LAB — VITAMIN D 25 HYDROXY (VIT D DEFICIENCY, FRACTURES): Vit D, 25-Hydroxy: 44 ng/mL (ref 30–89)

## 2013-01-10 NOTE — Progress Notes (Signed)
Physical Therapy Treatment Patient Details  Name: BRITANI BEATTIE MRN: [id number] Date of Birth: 1946/02/06  Today's Date: 01/10/2013 Time: 0865-7846 PT Time Calculation (min): 63 min Charge: [phone], Traction 2440-1027  Visit#: 21 of 24  Re-eval: 01/13/13 Assessment Diagnosis: Low back pain Next MD Visit: Dr. Lovell Sheehan October 17 Prior Therapy: None  Authorization: Medicare  Authorization Time Period: Gcode complete 12th visit  Authorization Visit#: 21 of 22   Subjective: Symptoms/Limitations Symptoms: Pt reports she was pain free for the last 6 days, states Rt hip and radicular pain symptoms down Rt LE.   Pain Assessment Currently in Pain?: Yes Pain Score: 3  Pain Location: Hip Pain Orientation: Right  Precautions/Restrictions  Precautions Precautions: None  Exercise/Treatments Aerobic Elliptical: NuStep x10 minutes, Hills #3, Level 5 seat 7 for LE strengthening. Standing Scapular Retraction: Left;15 reps;Theraband Theraband Level (Scapular Retraction): Level 3 (Green) Row: 15 reps;Theraband Theraband Level (Row): Level 3 (Green) Shoulder Extension: Both;15 reps Theraband Level (Shoulder Extension): Level 3 (Green) Prone  Single Arm Raise: 10 reps Straight Leg Raise: 10 reps Opposite Arm/Leg Raise: Right arm/Left leg;Left arm/Right leg;5 reps Other Prone Lumbar Exercises: rows, wback and shoulder extension 10x 5"  Traction Type of Traction: Lumbar Min (lbs): 0 Max (lbs): 75 Hold Time: 60 Rest Time: 15 Time: 20 min.  25 degree angle  Physical Therapy Assessment and Plan PT Assessment and Plan Clinical Impression Statement: Pt with great form and technique with all therex today, no cueing required.  Did add prone postural strengthening exercises to increase tolerance prone and to decrease radicular pain symptoms.  Ended session wth intermittent lumbar traction with good results.  Pt encouraged to drink extra water following session to reduce risk of  headaches. PT Plan: Re-eval next session, gcode due.      Goals Home Exercise Program Pt/caregiver will Perform Home Exercise Program: Independently PT Goal: Perform Home Exercise Program - Progress: Met PT Short Term Goals Time to Complete Short Term Goals: 3 weeks PT Short Term Goal 1: Pt will report decreased radicular pain by 50% to LLE while sitting for improved QOL.  PT Short Term Goal 1 - Progress: Progressing toward goal PT Short Term Goal 2: Pt will improve BLE strength by 1 muscle grade for greater ease with walking 10 minutes.  PT Short Term Goal 2 - Progress: Met PT Short Term Goal 3: Pt will be independent with core muscle activation to decrease raidcular symptoms.  PT Short Term Goal 3 - Progress: Progressing toward goal PT Short Term Goal 4: Pt will improve proprioception and demonstrate Rt and Lt SLS x10 seconds on solid surface PT Short Term Goal 4 - Progress: Met PT Short Term Goal 5: Pt will report pain less than 3/10 for 75% of her day for improved QOL.  PT Long Term Goals Time to Complete Long Term Goals:  (6 weeks) PT Long Term Goal 1: Pt will improve her core strength to [facility] in order to sit with appropriate posture x10 minutes without radicular symptoms.  PT Long Term Goal 1 - Progress: Progressing toward goal PT Long Term Goal 2: Pt will improve her lumbar flexion by 50% for greater ease with sit to stand.  PT Long Term Goal 2 - Progress: Met Long Term Goal 3: Pt will improve her BLE strength to [facility] in order to ambulate x30 minutes with improved ease to continue with household activities.  (no AD) Long Term Goal 3 Progress: Met Long Term Goal 4: Pt will be educated on proper  body mechanics with sitting and standing to decrease risk of further injury Long Term Goal 4 Progress: Met PT Long Term Goal 5: Pt will improve her LEFS to 40/80 for improved QOL.   Problem List Patient Active Problem List   Diagnosis Date Noted  . Flank pain 09/30/2012  . Chest pain,  atypical 02/10/2012  . 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 NECK PAIN 07/10/2008  . DIABETES MELLITUS, TYPE II 10/12/2007  . OBESITY 10/12/2007  . ANXIETY 10/12/2007  . DEPRESSION 10/12/2007  . HYPERTENSION 10/12/2007  . GERD 10/12/2007  . Back pain with radiation 10/12/2007    PT - End of Session Activity Tolerance: Patient tolerated treatment well General Behavior During Therapy: [facility] for tasks assessed/performed  GP    Juel Burrow 01/10/2013, 12:01 PM

## 2013-01-11 ENCOUNTER — Encounter: Payer: Self-pay | Admitting: Family Medicine

## 2013-01-11 ENCOUNTER — Ambulatory Visit (INDEPENDENT_AMBULATORY_CARE_PROVIDER_SITE_OTHER): Payer: PRIVATE HEALTH INSURANCE | Admitting: Family Medicine

## 2013-01-11 VITALS — BP 118/74 | HR 70 | Resp 16 | Wt 206.8 lb

## 2013-01-11 DIAGNOSIS — E669 Obesity, unspecified: Secondary | ICD-10-CM

## 2013-01-11 DIAGNOSIS — Z13820 Encounter for screening for osteoporosis: Secondary | ICD-10-CM

## 2013-01-11 DIAGNOSIS — K219 Gastro-esophageal reflux disease without esophagitis: Secondary | ICD-10-CM

## 2013-01-11 DIAGNOSIS — Z1211 Encounter for screening for malignant neoplasm of colon: Secondary | ICD-10-CM

## 2013-01-11 DIAGNOSIS — I10 Essential (primary) hypertension: Secondary | ICD-10-CM

## 2013-01-11 DIAGNOSIS — M549 Dorsalgia, unspecified: Secondary | ICD-10-CM

## 2013-01-11 DIAGNOSIS — E119 Type 2 diabetes mellitus without complications: Secondary | ICD-10-CM

## 2013-01-11 DIAGNOSIS — Z23 Encounter for immunization: Secondary | ICD-10-CM

## 2013-01-11 LAB — LIPID PANEL
Cholesterol: 134 mg/dL (ref 0–200)
Total CHOL/HDL Ratio: 2.7 Ratio

## 2013-01-11 MED ORDER — TRAMADOL HCL 50 MG PO TABS: ORAL_TABLET | ORAL | Status: DC

## 2013-01-11 NOTE — Patient Instructions (Addendum)
Annual wellness in 4 month, call if you need me before  Flu vaccine today Microalb today   Blood sugar has increased slightly , so please cut back on potatoes!   Happy that therapy is helping your back so much  Tramadol is sent in  You are referred for eye exam, colonoscopy and osteoperosis screen (when due) as discussed  HBA1C, chem 7 and EGFr in 4 month

## 2013-01-12 ENCOUNTER — Encounter (INDEPENDENT_AMBULATORY_CARE_PROVIDER_SITE_OTHER): Payer: Self-pay | Admitting: *Deleted

## 2013-01-12 LAB — MICROALBUMIN / CREATININE URINE RATIO
Creatinine, Urine: 95.8 mg/dL
Microalb Creat Ratio: 5.2 mg/g (ref 0.0–30.0)

## 2013-01-13 ENCOUNTER — Ambulatory Visit (HOSPITAL_COMMUNITY)
Admission: RE | Admit: 2013-01-13 | Discharge: 2013-01-13 | Disposition: A | Discharge status: Home / Self Care | Payer: Medicare Other | Source: Ambulatory Visit | Attending: Neurosurgery | Admitting: Neurosurgery

## 2013-01-13 DIAGNOSIS — M549 Dorsalgia, unspecified: Secondary | ICD-10-CM

## 2013-01-13 DIAGNOSIS — IMO0001 Reserved for inherently not codable concepts without codable children: Secondary | ICD-10-CM | POA: Insufficient documentation

## 2013-01-13 DIAGNOSIS — M545 Low back pain, unspecified: Secondary | ICD-10-CM | POA: Insufficient documentation

## 2013-01-13 DIAGNOSIS — I10 Essential (primary) hypertension: Secondary | ICD-10-CM | POA: Insufficient documentation

## 2013-01-13 DIAGNOSIS — E119 Type 2 diabetes mellitus without complications: Secondary | ICD-10-CM | POA: Insufficient documentation

## 2013-01-13 NOTE — Evaluation (Signed)
Physical Therapy Discharge Summary/Treatment Patient Details  Name: Shannon Romero MRN: [id number] Date of Birth: 1945-04-24  Today's Date: 01/13/2013 Time: 5784-6962 PT Time Calculation (min): 44 min Charges:  1 ROM/1 MMT: 1110-1103 Self Care: 9528-4132 Traction: 4401-0272             Visit#: 22 of 24  Re-eval: 01/13/13 Assessment Diagnosis: Low back pain Next MD Visit: Dr. Lovell Sheehan October 17  Authorization: Medicare    Authorization Time Period: Gcode complete 12th visit  Authorization Visit#: 22 of 22   Subjective Symptoms/Limitations Symptoms: She reports that continues to have the pain to her gluteal region which on average is a 3/10.  She feels that the mechanical traction has helped.  She is doing some of the exercises everyday. She is back to vacuuming and sweeping  How long can you sit comfortably?: 2 hours (was 30-60 minutes) How long can you stand comfortably?: 2 hours (was 30 minutes) How long can you walk comfortably?: 1 hour (was 15 minutes) Pain Assessment Currently in Pain?: Yes Pain Score: 3  Pain Location: Hip Pain Orientation: Right  Precautions/Restrictions  Precautions Precautions: None  Sensation/Coordination/Flexibility/Functional Tests Functional Tests Functional Tests: Lower Extremity Functional Scale: 55/80(was 39/80)  Assessment RLE Strength Right Hip Flexion:  (4+/5 was 4/5) Right Hip Extension: 5/5 (was 3+/5) Right Hip ABduction: 5/5 (was 4/5) LLE Strength Left Hip Flexion:  (4+/5, was 4/5) Left Hip Extension: 5/5 (was 3+/5) Left Hip ABduction: 5/5 (was 3+/5) Lumbar AROM Lumbar Flexion: decreased 10% (was decreased 50%) Lumbar Extension: WNL (was decreased 30%) Palpation Palpation: minimal fascial restrictin to lumbar paraspinals and gluteal region  Mobility/Balance  Ambulation/Gait Ambulation/Gait: Yes Assistive device: None (was SPC) Gait Pattern: Within Functional Limits (was antalgic, trunk flexion, decreased knee & hip  flexion) Gait velocity: WNL (was decreased) Static Standing Balance Single Leg Stance - Right Leg: 30 Single Leg Stance - Left Leg: 23 Tandem Stance - Right Leg: 30 (was 3) Tandem Stance - Left Leg: 30 (was 0)   Exercise/Treatments Stretches Passive Hamstring Stretch: 1 rep;60 seconds;Limitations Passive Hamstring Stretch Limitations: BLE Standing Other Standing Lumbar Exercises: Back Bends 10x  Traction Type of Traction: Lumbar Min (lbs): 25 Max (lbs): 75 Hold Time: 60 Rest Time: 15 Time: 20 min.  25 degree angle  Physical Therapy Assessment and Plan PT Assessment and Plan Clinical Impression Statement: Ms. Seelinger has attended 22 OP PT vistis over 8 weeks for low back pain with following findings: continues to have some limitation with mild pain (avg 3/10) which is intermittent and is likely related to hamstring tightness and myofascial restrictions. She has been educated and is independent and adherent with her HEP to include core stablization, LE strentghenging and stretching activities.  Encouraged pt to join a local gym to continue with strengthening and pain control  She has been able to return to her household heavy ADL's and may expirence 3/10 pain the next day to her legs.  At this time pt has recieved all necessary education and will be d/c from PT.  PT Plan: D/C    Goals Home Exercise Program Pt/caregiver will Perform Home Exercise Program: Independently PT Goal: Perform Home Exercise Program - Progress: Met PT Short Term Goals Time to Complete Short Term Goals: 3 weeks PT Short Term Goal 1: Pt will report decreased radicular pain by 50% to LLE while sitting for improved QOL.  PT Short Term Goal 1 - Progress: Met PT Short Term Goal 2: Pt will improve BLE strength by 1 muscle grade  for greater ease with walking 10 minutes.  PT Short Term Goal 2 - Progress: Met PT Short Term Goal 3: Pt will be independent with core muscle activation to decrease raidcular symptoms.  PT  Short Term Goal 3 - Progress: Met PT Short Term Goal 4: Pt will improve proprioception and demonstrate Rt and Lt SLS x10 seconds on solid surface PT Short Term Goal 4 - Progress: Met PT Short Term Goal 5: Pt will report pain less than 3/10 for 75% of her day for improved QOL.  PT Short Term Goal 5 - Progress: Met PT Long Term Goals Time to Complete Long Term Goals:  (6 weeks) PT Long Term Goal 1: Pt will improve her core strength to [facility] in order to sit with appropriate posture x10 minutes without radicular symptoms.  PT Long Term Goal 1 - Progress: Met PT Long Term Goal 2: Pt will improve her lumbar flexion by 50% for greater ease with sit to stand.  PT Long Term Goal 2 - Progress: Met Long Term Goal 3: Pt will improve her BLE strength to [facility] in order to ambulate x30 minutes with improved ease to continue with household activities.  (no AD) Long Term Goal 3 Progress: Met Long Term Goal 4: Pt will be educated on proper body mechanics with sitting and standing to decrease risk of further injury Long Term Goal 4 Progress: Met PT Long Term Goal 5: Pt will improve her LEFS to 40/80 for improved QOL.  (LEFS:55/80) Long Term Goal 5 Progress: Met  Problem List Patient Active Problem List   Diagnosis Date Noted  . Flank pain 09/30/2012  . Chest pain, atypical 02/10/2012  . NECK PAIN 07/10/2008  . DIABETES MELLITUS, TYPE II 10/12/2007  . OBESITY 10/12/2007  . DEPRESSION 10/12/2007  . HYPERTENSION 10/12/2007  . GERD 10/12/2007  . Back pain with radiation 10/12/2007    PT Plan of Care PT Patient Instructions: discussed LEFS, goals and  Consulted and Agree with Plan of Care: Patient  GP Functional Assessment Tool Used: based on clinicial observation and LEFS 55/80= 37% Functional Limitation: Self care Self Care Goal Status ([id number]): At least 1 percent but less than 20 percent impaired, limited or restricted Self Care Discharge Status [id number]): At least 1 percent but less than 20 percent  impaired, limited or restricted  Camryn Lampson, MPT, ATC 01/13/2013, 11:52 AM  Physician Documentation Your signature is required to indicate approval of the treatment plan as stated above.  Please sign and either send electronically or make a copy of this report for your files and return this physician signed original.   Please mark one 1.__approve of plan  2. ___approve of plan with the following conditions.   ______________________________                                                          _____________________ Physician Signature  Date  

## 2013-01-13 NOTE — Progress Notes (Signed)
  Patient Details  Name: Shannon Romero MRN: [id number] Date of Birth: [birth year]  Today's Date: 01/13/2013  Manually faxed Initial evaluation, progress note and discharge summary to MD on 01/13/13.  Instructions for signature.  This is the second attempt   Lala Been, MPT, ATC 01/13/2013, 11:57 AM

## 2013-01-15 NOTE — Assessment & Plan Note (Signed)
Controlled, no change in medication DASH diet and commitment to daily physical activity for a minimum of 30 minutes discussed and encouraged, as a part of hypertension management. The importance of attaining a healthy weight is also discussed.  

## 2013-01-15 NOTE — Assessment & Plan Note (Signed)
Controlled, no change in medication  

## 2013-01-15 NOTE — Assessment & Plan Note (Signed)
Unchnanged Patient re-educated about  the importance of commitment to a  minimum of 150 minutes of exercise per week. The importance of healthy food choices with portion control discussed. Encouraged to start a food diary, count calories and to consider  joining a support group. Sample diet sheets offered. Goals set by the patient for the next several months.

## 2013-01-15 NOTE — Assessment & Plan Note (Signed)
Slight increase in hBa1C , but still controlled. No med change. Decrease carb intake and weight loss encouraged

## 2013-01-15 NOTE — Assessment & Plan Note (Signed)
Improving with pT through neurosurgery. Due to cost , pt switching back to tramadol from vicodin for pain relief outside of the gabapentin

## 2013-01-15 NOTE — Progress Notes (Signed)
  Subjective:    Patient ID: Shannon Romero, female    DOB: 04/10/1946, 67 y.o.   MRN: [id number]  HPI The PT is here for follow up and re-evaluation of chronic medical conditions, medication management and review of any available recent lab and radiology data.  Preventive health is updated, specifically  Cancer screening and Immunization.   Has been to neurosurgery re back, currently in physical therapy which is working very well for her. The PT denies any adverse reactions to current medications since the last visit.  There are no new concerns except need to change to a more affordable pain med from vicodin, returning to tramadol  There are no specific complaints       Review of Systems See HPI Denies recent fever or chills. Denies sinus pressure, nasal congestion, ear pain or sore throat. Denies chest congestion, productive cough or wheezing. Denies chest pains, palpitations and leg swelling Denies abdominal pain, nausea, vomiting,diarrhea or constipation.   Denies dysuria, frequency, hesitancy or incontinence. Denies uncontrolled  joint pain, swelling and limitation in mobility. Denies headaches, seizures, numbness, or tingling. Denies depression, anxiety or insomnia. Denies skin break down or rash.        Objective:   Physical Exam  Patient alert and oriented and in no cardiopulmonary distress.  HEENT: No facial asymmetry, EOMI, no sinus tenderness,  oropharynx pink and moist.  Neck supple no adenopathy.  Chest: Clear to auscultation bilaterally.  CVS: S1, S2 no murmurs, no S3.  ABD: Soft non tender.   Ext: No edema  WU:[id number]  ROM spineadequate in  shoulders, hips and knees.  Skin: Intact, no ulcerations or rash noted.  Psych: Good eye contact, normal affect. Memory intact not anxious or depressed appearing.  CNS: CN 2-12 intact, power,  normal throughout.       Assessment & Plan:

## 2013-01-23 ENCOUNTER — Ambulatory Visit (HOSPITAL_COMMUNITY)
Admission: RE | Admit: 2013-01-23 | Discharge: 2013-01-23 | Disposition: A | Discharge status: Home / Self Care | Payer: Medicare (Managed Care) | Source: Ambulatory Visit | Attending: Family Medicine | Admitting: Family Medicine

## 2013-01-23 DIAGNOSIS — M069 Rheumatoid arthritis, unspecified: Secondary | ICD-10-CM | POA: Insufficient documentation

## 2013-01-23 DIAGNOSIS — R29890 Loss of height: Secondary | ICD-10-CM | POA: Insufficient documentation

## 2013-01-23 DIAGNOSIS — Z780 Asymptomatic menopausal state: Secondary | ICD-10-CM | POA: Insufficient documentation

## 2013-01-23 DIAGNOSIS — Z13820 Encounter for screening for osteoporosis: Secondary | ICD-10-CM

## 2013-02-27 ENCOUNTER — Other Ambulatory Visit: Payer: Self-pay | Admitting: Family Medicine

## 2013-03-14 ENCOUNTER — Encounter: Payer: Self-pay | Admitting: Family Medicine

## 2013-03-14 ENCOUNTER — Encounter: Payer: Self-pay | Admitting: Internal Medicine

## 2013-03-14 DIAGNOSIS — H269 Unspecified cataract: Secondary | ICD-10-CM | POA: Insufficient documentation

## 2013-03-16 ENCOUNTER — Other Ambulatory Visit: Payer: Self-pay | Admitting: Family Medicine

## 2013-03-16 DIAGNOSIS — Z139 Encounter for screening, unspecified: Secondary | ICD-10-CM

## 2013-03-21 ENCOUNTER — Ambulatory Visit (HOSPITAL_COMMUNITY)
Admission: RE | Admit: 2013-03-21 | Discharge: 2013-03-21 | Disposition: A | Discharge status: Home / Self Care | Payer: Medicare Other | Source: Ambulatory Visit | Attending: Family Medicine | Admitting: Family Medicine

## 2013-03-21 DIAGNOSIS — Z1231 Encounter for screening mammogram for malignant neoplasm of breast: Secondary | ICD-10-CM | POA: Insufficient documentation

## 2013-03-21 DIAGNOSIS — Z139 Encounter for screening, unspecified: Secondary | ICD-10-CM

## 2013-03-27 ENCOUNTER — Other Ambulatory Visit: Payer: Self-pay | Admitting: Family Medicine

## 2013-03-27 DIAGNOSIS — R928 Other abnormal and inconclusive findings on diagnostic imaging of breast: Secondary | ICD-10-CM

## 2013-04-05 ENCOUNTER — Encounter: Payer: Self-pay | Admitting: Gastroenterology

## 2013-04-05 ENCOUNTER — Ambulatory Visit (INDEPENDENT_AMBULATORY_CARE_PROVIDER_SITE_OTHER): Payer: Medicare (Managed Care) | Admitting: Gastroenterology

## 2013-04-05 ENCOUNTER — Other Ambulatory Visit: Payer: Self-pay | Admitting: Internal Medicine

## 2013-04-05 VITALS — BP 129/76 | HR 63 | Temp 97.6°F | Wt 206.8 lb

## 2013-04-05 DIAGNOSIS — Z800 Family history of malignant neoplasm of digestive organs: Secondary | ICD-10-CM

## 2013-04-05 MED ORDER — PEG 3350-KCL-NA BICARB-NACL 420 G PO SOLR: 4000.0000 mL | ORAL | Status: DC

## 2013-04-05 NOTE — Patient Instructions (Signed)
You have been scheduled for a colonoscopy with Dr. Jena Gauss in the near future.  Have a wonderful holiday season and [place] Year!

## 2013-04-05 NOTE — Progress Notes (Signed)
cc'd to pcp 

## 2013-04-05 NOTE — Progress Notes (Signed)
    Primary Care Physician:  Margaret Simpson, MD Primary Gastroenterologist:  Dr. Rourk   Chief Complaint  Patient presents with  . Follow-up    HPI:   Shannon Romero is a pleasant 67-year-old female presenting today to schedule high-risk screening colonoscopy. Last in 2009 by Dr. Rourk normal. Past family history of colon cancer in first-degree relative at young age. Brother diagnosed at age 51 and deceased shortly thereafter. No rectal bleeding. No significant changes changes in bowel habits. No abdominal pain. No upper GI symptoms such as reflux, dysphagia. No appetite changes.   Past Medical History  Diagnosis Date  . Hypertension   . Diabetes mellitus   . GERD (gastroesophageal reflux disease)   . Bulging disc     Past Surgical History  Procedure Laterality Date  . Tubal ligation    . Ovarian cyst removal    . Colonoscopy  03/26/2008    RMR:Normal rectum, scattered pancolonic diverticula and colonic/mucosa appeared normal, normal terminal ileum.  . Colonoscopy  02/28/2003    NUR: A few scattered small diverticula throughout the colon/ No evidence of colonic polyps or neoplasm/ Polyps or other lesions which potentially cause GI blood loss    Current Outpatient Prescriptions  Medication Sig Dispense Refill  . aspirin 81 MG tablet Take 81 mg by mouth daily.        . Calcium Carb-Cholecalciferol (CALCIUM + D3) 600-200 MG-UNIT TABS Take 2 tablets by mouth daily.      . fish oil-omega-3 fatty acids 1000 MG capsule Take 1 g by mouth daily.        . lisinopril-hydrochlorothiazide (PRINZIDE,ZESTORETIC) 20-25 MG per tablet TAKE ONE TABLET BY MOUTH ONCE DAILY  30 tablet  3  . meloxicam (MOBIC) 15 MG tablet TAKE ONE TABLET BY MOUTH ONCE DAILY  30 tablet  5  . metFORMIN (GLUCOPHAGE) 1000 MG tablet TAKE ONE TABLET BY MOUTH TWICE DAILY WITH MEALS  60 tablet  5  . Multiple Vitamin (MULTIVITAMIN) capsule Take 1 capsule by mouth daily.      . pyridoxine (B-6) 100 MG tablet TWO TABS BY  MOUTH DAILY       . UNABLE TO FIND Equate Arthritis pills bid       Current Facility-Administered Medications  Medication Dose Route Frequency Provider Last Rate Last Dose  . Influenza (>/= 3 years) inactive virus vaccine (FLVIRIN/FLUZONE) injection SUSP 0.5 mL  0.5 mL Intramuscular Once Margaret E Simpson, MD        Allergies as of 04/05/2013  . (No Known Allergies)    Family History  Problem Relation Age of Onset  . Diabetes Mother   . Heart disease Mother   . Colon cancer Father     Diagnosed at age 51, deceased at 52  . Diabetes Sister     X 5  . Heart disease Sister     X 2  . Hypertension Sister     X 7  . Diabetes Brother     X 1  . Cancer Brother     LUNG BRAIN AND THROAT    History   Social History  . Marital Status: Married    Spouse Name: N/A    Number of Children: N/A  . Years of Education: N/A   Occupational History  . Not on file.   Social History Main Topics  . Smoking status: Former Smoker  . Smokeless tobacco: Not on file  . Alcohol Use: No  . Drug Use: No  .   Sexual Activity: No   Other Topics Concern  . Not on file   Social History Narrative  . No narrative on file    Review of Systems: Gen: Denies any fever, chills, fatigue, weight loss, lack of appetite.  CV: Denies chest pain, heart palpitations, peripheral edema, syncope.  Resp: Denies shortness of breath at rest or with exertion. Denies wheezing or cough.  GI: Denies dysphagia or odynophagia. Denies jaundice, hematemesis, fecal incontinence. GU : Denies urinary burning, urinary frequency, urinary hesitancy MS: back pain Derm: Denies rash, itching, dry skin Psych: Denies depression, anxiety, memory loss, and confusion Heme: Denies bruising, bleeding, and enlarged lymph nodes.  Physical Exam: BP 129/76  Pulse 63  Temp(Src) 97.6 F (36.4 C) (Oral)  Wt 206 lb 12.8 oz (93.804 kg) General:   Alert and oriented. Pleasant and cooperative. Well-nourished and well-developed.  Head:   Normocephalic and atraumatic. Eyes:  Without icterus, sclera clear and conjunctiva pink.  Ears:  Normal auditory acuity. Nose:  No deformity, discharge,  or lesions. Mouth:  No deformity or lesions, oral mucosa pink.  Neck:  Supple, without mass or thyromegaly. Lungs:  Clear to auscultation bilaterally. No wheezes, rales, or rhonchi. No distress.  Heart:  S1, S2 present without murmurs appreciated.  Abdomen:  +BS, soft, non-tender and non-distended. No HSM noted. No guarding or rebound. No masses appreciated.  Rectal:  Deferred  Msk:  Symmetrical without gross deformities. Normal posture. Extremities:  Without clubbing or edema. Neurologic:  Alert and  oriented x4;  grossly normal neurologically. Skin:  Intact without significant lesions or rashes. Cervical Nodes:  No significant cervical adenopathy. Psych:  Alert and cooperative. Normal mood and affect.     

## 2013-04-05 NOTE — Assessment & Plan Note (Signed)
Due now for high risk screening due to family history in first degree relative at a young age. She has no lower or upper GI symptoms of concern. Last colonoscopy in 2009 normal.  Proceed with TCS with Dr. Jena Gauss in near future: the risks, benefits, and alternatives have been discussed with the patient in detail. The patient states understanding and desires to proceed.

## 2013-04-07 ENCOUNTER — Other Ambulatory Visit: Payer: Self-pay | Admitting: Family Medicine

## 2013-04-11 ENCOUNTER — Encounter (HOSPITAL_COMMUNITY): Payer: Self-pay | Admitting: Pharmacy Technician

## 2013-04-19 ENCOUNTER — Ambulatory Visit (HOSPITAL_COMMUNITY)
Admission: RE | Admit: 2013-04-19 | Discharge: 2013-04-19 | Disposition: A | Discharge status: Home / Self Care | Payer: Medicare HMO | Source: Ambulatory Visit | Attending: Family Medicine | Admitting: Family Medicine

## 2013-04-19 ENCOUNTER — Other Ambulatory Visit: Payer: Self-pay | Admitting: Family Medicine

## 2013-04-19 DIAGNOSIS — R928 Other abnormal and inconclusive findings on diagnostic imaging of breast: Secondary | ICD-10-CM

## 2013-04-19 DIAGNOSIS — R921 Mammographic calcification found on diagnostic imaging of breast: Secondary | ICD-10-CM

## 2013-04-19 DIAGNOSIS — N63 Unspecified lump in unspecified breast: Secondary | ICD-10-CM | POA: Insufficient documentation

## 2013-04-21 ENCOUNTER — Ambulatory Visit (HOSPITAL_COMMUNITY)
Admission: RE | Admit: 2013-04-21 | Discharge: 2013-04-21 | Disposition: A | Discharge status: Home / Self Care | Payer: Medicare HMO | Source: Ambulatory Visit | Attending: Internal Medicine | Admitting: Internal Medicine

## 2013-04-21 ENCOUNTER — Encounter (HOSPITAL_COMMUNITY)
Admission: RE | Disposition: A | Discharge status: Home / Self Care | Payer: Self-pay | Source: Ambulatory Visit | Attending: Internal Medicine

## 2013-04-21 ENCOUNTER — Encounter (HOSPITAL_COMMUNITY): Payer: Self-pay | Admitting: *Deleted

## 2013-04-21 DIAGNOSIS — Z800 Family history of malignant neoplasm of digestive organs: Secondary | ICD-10-CM | POA: Insufficient documentation

## 2013-04-21 DIAGNOSIS — Z1211 Encounter for screening for malignant neoplasm of colon: Secondary | ICD-10-CM | POA: Insufficient documentation

## 2013-04-21 DIAGNOSIS — Z7982 Long term (current) use of aspirin: Secondary | ICD-10-CM | POA: Insufficient documentation

## 2013-04-21 DIAGNOSIS — D126 Benign neoplasm of colon, unspecified: Secondary | ICD-10-CM

## 2013-04-21 DIAGNOSIS — K5730 Diverticulosis of large intestine without perforation or abscess without bleeding: Secondary | ICD-10-CM | POA: Insufficient documentation

## 2013-04-21 DIAGNOSIS — E119 Type 2 diabetes mellitus without complications: Secondary | ICD-10-CM | POA: Insufficient documentation

## 2013-04-21 DIAGNOSIS — I10 Essential (primary) hypertension: Secondary | ICD-10-CM | POA: Insufficient documentation

## 2013-04-21 DIAGNOSIS — Z79899 Other long term (current) drug therapy: Secondary | ICD-10-CM | POA: Insufficient documentation

## 2013-04-21 DIAGNOSIS — Z01812 Encounter for preprocedural laboratory examination: Secondary | ICD-10-CM | POA: Insufficient documentation

## 2013-04-21 HISTORY — PX: COLONOSCOPY: SHX5424

## 2013-04-21 LAB — GLUCOSE, CAPILLARY: GLUCOSE-CAPILLARY: 106 mg/dL — AB (ref 70–99)

## 2013-04-21 SURGERY — COLONOSCOPY
Anesthesia: Moderate Sedation

## 2013-04-21 MED ORDER — SIMETHICONE 40 MG/0.6ML PO SUSP
ORAL | Status: DC | PRN
  Administered 2013-04-21: 10:00:00

## 2013-04-21 MED ORDER — ONDANSETRON HCL 4 MG/2ML IJ SOLN
INTRAMUSCULAR | Status: DC | PRN
  Administered 2013-04-21: 4 mg via INTRAVENOUS

## 2013-04-21 MED ORDER — MEPERIDINE HCL 100 MG/ML IJ SOLN
INTRAMUSCULAR | Status: AC
  Filled 2013-04-21: qty 2

## 2013-04-21 MED ORDER — MIDAZOLAM HCL 5 MG/5ML IJ SOLN
INTRAMUSCULAR | Status: AC
  Filled 2013-04-21: qty 10

## 2013-04-21 MED ORDER — SODIUM CHLORIDE 0.9 % IV SOLN: INTRAVENOUS | Status: DC

## 2013-04-21 MED ORDER — MEPERIDINE HCL 100 MG/ML IJ SOLN
INTRAMUSCULAR | Status: DC | PRN
  Administered 2013-04-21: 50 mg via INTRAVENOUS
  Administered 2013-04-21 (×2): 25 mg via INTRAVENOUS

## 2013-04-21 MED ORDER — ONDANSETRON HCL 4 MG/2ML IJ SOLN
INTRAMUSCULAR | Status: AC
  Filled 2013-04-21: qty 2

## 2013-04-21 MED ORDER — MIDAZOLAM HCL 5 MG/5ML IJ SOLN
INTRAMUSCULAR | Status: DC | PRN
  Administered 2013-04-21 (×2): 1 mg via INTRAVENOUS
  Administered 2013-04-21 (×2): 2 mg via INTRAVENOUS

## 2013-04-21 NOTE — Discharge Instructions (Addendum)
Colonoscopy Discharge Instructions  Read the instructions outlined below and refer to this sheet in the next few weeks. These discharge instructions provide you with general information on caring for yourself after you leave the hospital. Your doctor may also give you specific instructions. While your treatment has been planned according to the most current medical practices available, unavoidable complications occasionally occur. If you have any problems or questions after discharge, call Dr. Gala Romney at [phone]. ACTIVITY  You may resume your regular activity, but move at a slower pace for the next 24 hours.   Take frequent rest periods for the next 24 hours.   Walking will help get rid of the air and reduce the bloated feeling in your belly (abdomen).   No driving for 24 hours (because of the medicine (anesthesia) used during the test).    Do not sign any important legal documents or operate any machinery for 24 hours (because of the anesthesia used during the test).  NUTRITION  Drink plenty of fluids.   You may resume your normal diet as instructed by your doctor.   Begin with a light meal and progress to your normal diet. Heavy or fried foods are harder to digest and may make you feel sick to your stomach (nauseated).   Avoid alcoholic beverages for 24 hours or as instructed.  MEDICATIONS  You may resume your normal medications unless your doctor tells you otherwise.  WHAT YOU CAN EXPECT TODAY  Some feelings of bloating in the abdomen.   Passage of more gas than usual.   Spotting of blood in your stool or on the toilet paper.  IF YOU HAD POLYPS REMOVED DURING THE COLONOSCOPY:  No aspirin products for 7 days or as instructed.   No alcohol for 7 days or as instructed.   Eat a soft diet for the next 24 hours.  FINDING OUT THE RESULTS OF YOUR TEST Not all test results are available during your visit. If your test results are not back during the visit, make an appointment  with your caregiver to find out the results. Do not assume everything is normal if you have not heard from your caregiver or the medical facility. It is important for you to follow up on all of your test results.  SEEK IMMEDIATE MEDICAL ATTENTION IF:  You have more than a spotting of blood in your stool.   Your belly is swollen (abdominal distention).   You are nauseated or vomiting.   You have a temperature over 101.   You have abdominal pain or discomfort that is severe or gets worse throughout the day.    Diverticulosis and polyp information provided  Further recommendations to follow pending review of pathology   Colonoscopy Care After Read the instructions outlined below and refer to this sheet in the next few weeks. These discharge instructions provide you with general information on caring for yourself after you leave the hospital. Your doctor may also give you specific instructions. While your treatment has been planned according to the most current medical practices available, unavoidable complications occasionally occur. If you have any problems or questions after discharge, call your doctor. HOME CARE INSTRUCTIONS ACTIVITY:  You may resume your regular activity, but move at a slower pace for the next 24 hours.  Take frequent rest periods for the next 24 hours.  Walking will help get rid of the air and reduce the bloated feeling in your belly (abdomen).  No driving for 24 hours (because of the medicine (anesthesia)  used during the test).  You may shower.  Do not sign any important legal documents or operate any machinery for 24 hours (because of the anesthesia used during the test). NUTRITION:  Drink plenty of fluids.  You may resume your normal diet as instructed by your doctor.  Begin with a light meal and progress to your normal diet. Heavy or fried foods are harder to digest and may make you feel sick to your stomach (nauseated).  Avoid alcoholic beverages  for 24 hours or as instructed. MEDICATIONS:  You may resume your normal medications unless your doctor tells you otherwise. WHAT TO EXPECT TODAY:  Some feelings of bloating in the abdomen.  Passage of more gas than usual.  Spotting of blood in your stool or on the toilet paper. IF YOU HAD POLYPS REMOVED DURING THE COLONOSCOPY:  No aspirin products for 7 days or as instructed.  No alcohol for 7 days or as instructed.  Eat a soft diet for the next 24 hours. FINDING OUT THE RESULTS OF YOUR TEST Not all test results are available during your visit. If your test results are not back during the visit, make an appointment with your caregiver to find out the results. Do not assume everything is normal if you have not heard from your caregiver or the medical facility. It is important for you to follow up on all of your test results.  SEEK IMMEDIATE MEDICAL CARE IF:  You have more than a spotting of blood in your stool.  Your belly is swollen (abdominal distention).  You are nauseated or vomiting.  You have a fever.  You have abdominal pain or discomfort that is severe or gets worse throughout the day. Document Released: 11/12/2003 Document Revised: 06/22/2011 Document Reviewed: 11/10/2007 [facility] Patient Information 2014 [place]. Colon Polyps Polyps are lumps of extra tissue growing inside the body. Polyps can grow in the large intestine (colon). Most colon polyps are noncancerous (benign). However, some colon polyps can become cancerous over time. Polyps that are larger than a pea may be harmful. To be safe, caregivers remove and test all polyps. CAUSES  Polyps form when mutations in the genes cause your cells to grow and divide even though no more tissue is needed. RISK FACTORS There are a number of risk factors that can increase your chances of getting colon polyps. They include:  Being older than 50 years.  Family history of colon polyps or colon cancer.  Long-term  colon diseases, such as colitis or Crohn disease.  Being overweight.  Smoking.  Being inactive.  Drinking too much alcohol. SYMPTOMS  Most small polyps do not cause symptoms. If symptoms are present, they may include:  Blood in the stool. The stool may look dark red or black.  Constipation or diarrhea that lasts longer than 1 week. DIAGNOSIS People often do not know they have polyps until their caregiver finds them during a regular checkup. Your caregiver can use 4 tests to check for polyps:  Digital rectal exam. The caregiver wears gloves and feels inside the rectum. This test would find polyps only in the rectum.  Barium enema. The caregiver puts a liquid called barium into your rectum before taking X-rays of your colon. Barium makes your colon look white. Polyps are dark, so they are easy to see in the X-ray pictures.  Sigmoidoscopy. A thin, flexible tube (sigmoidoscope) is placed into your rectum. The sigmoidoscope has a light and tiny camera in it. The caregiver uses the sigmoidoscope to look  at the last third of your colon.  Colonoscopy. This test is like sigmoidoscopy, but the caregiver looks at the entire colon. This is the most common method for finding and removing polyps. TREATMENT  Any polyps will be removed during a sigmoidoscopy or colonoscopy. The polyps are then tested for cancer. PREVENTION  To help lower your risk of getting more colon polyps:  Eat plenty of fruits and vegetables. Avoid eating fatty foods.  Do not smoke.  Avoid drinking alcohol.  Exercise every day.  Lose weight if recommended by your caregiver.  Eat plenty of calcium and folate. Foods that are rich in calcium include milk, cheese, and broccoli. Foods that are rich in folate include chickpeas, kidney beans, and spinach. HOME CARE INSTRUCTIONS Keep all follow-up appointments as directed by your caregiver. You may need periodic exams to check for polyps. SEEK MEDICAL CARE IF: You notice  bleeding during a bowel movement. Document Released: 12/25/2003 Document Revised: 06/22/2011 Document Reviewed: 06/09/2011 [facility] Patient Information 2014 [place]. Diverticulosis Diverticulosis is a common condition that develops when small pouches (diverticula) form in the wall of the colon. The risk of diverticulosis increases with age. It happens more often in people who eat a low-fiber diet. Most individuals with diverticulosis have no symptoms. Those individuals with symptoms usually experience abdominal pain, constipation, or loose stools (diarrhea). HOME CARE INSTRUCTIONS   Increase the amount of fiber in your diet as directed by your caregiver or dietician. This may reduce symptoms of diverticulosis.  Your caregiver may recommend taking a dietary fiber supplement.  Drink at least 6 to 8 glasses of water each day to prevent constipation.  Try not to strain when you have a bowel movement.  Your caregiver may recommend avoiding nuts and seeds to prevent complications, although this is still an uncertain benefit.  Only take over-the-counter or prescription medicines for pain, discomfort, or fever as directed by your caregiver. FOODS WITH HIGH FIBER CONTENT INCLUDE:  Fruits. Apple, peach, pear, tangerine, raisins, prunes.  Vegetables. Brussels sprouts, asparagus, broccoli, cabbage, carrot, cauliflower, romaine lettuce, spinach, summer squash, tomato, winter squash, zucchini.  Starchy Vegetables. Baked beans, kidney beans, lima beans, split peas, lentils, potatoes (with skin).  Grains. Whole wheat bread, brown rice, bran flake cereal, plain oatmeal, white rice, shredded wheat, bran muffins. SEEK IMMEDIATE MEDICAL CARE IF:   You develop increasing pain or severe bloating.  You have an oral temperature above 102 F (38.9 C), not controlled by medicine.  You develop vomiting or bowel movements that are bloody or black. Document Released: 12/26/2003 Document Revised:  06/22/2011 Document Reviewed: 08/28/2009 [facility] Patient Information 2014 [place].

## 2013-04-21 NOTE — H&P (View-Only) (Signed)
Primary Care Physician:  [name], MD Primary Gastroenterologist:  Dr. Gala Romney   Chief Complaint  Patient presents with  . Follow-up    HPI:   Shannon Romero is a pleasant 68 year old female presenting today to schedule high-risk screening colonoscopy. Last in 2009 by Dr. Gala Romney normal. Past family history of colon cancer in first-degree relative at young age. Brother diagnosed at age 77 and deceased shortly thereafter. No rectal bleeding. No significant changes changes in bowel habits. No abdominal pain. No upper GI symptoms such as reflux, dysphagia. No appetite changes.   Past Medical History  Diagnosis Date  . Hypertension   . Diabetes mellitus   . GERD (gastroesophageal reflux disease)   . Bulging disc     Past Surgical History  Procedure Laterality Date  . Tubal ligation    . Ovarian cyst removal    . Colonoscopy  03/26/2008    YIR:SWNIOE rectum, scattered pancolonic diverticula and colonic/mucosa appeared normal, normal terminal ileum.  . Colonoscopy  02/28/2003    NUR: A few scattered small diverticula throughout the colon/ No evidence of colonic polyps or neoplasm/ Polyps or other lesions which potentially cause GI blood loss    Current Outpatient Prescriptions  Medication Sig Dispense Refill  . aspirin 81 MG tablet Take 81 mg by mouth daily.        . Calcium Carb-Cholecalciferol (CALCIUM + D3) 600-200 MG-UNIT TABS Take 2 tablets by mouth daily.      . fish oil-omega-3 fatty acids 1000 MG capsule Take 1 g by mouth daily.        Marland Kitchen lisinopril-hydrochlorothiazide (PRINZIDE,ZESTORETIC) 20-25 MG per tablet TAKE ONE TABLET BY MOUTH ONCE DAILY  30 tablet  3  . meloxicam (MOBIC) 15 MG tablet TAKE ONE TABLET BY MOUTH ONCE DAILY  30 tablet  5  . metFORMIN (GLUCOPHAGE) 1000 MG tablet TAKE ONE TABLET BY MOUTH TWICE DAILY WITH MEALS  60 tablet  5  . Multiple Vitamin (MULTIVITAMIN) capsule Take 1 capsule by mouth daily.      Marland Kitchen pyridoxine (B-6) 100 MG tablet TWO TABS BY  MOUTH DAILY       . UNABLE TO FIND Equate Arthritis pills bid       Current Facility-Administered Medications  Medication Dose Route Frequency Provider Last Rate Last Dose  . Influenza (>/= 3 years) inactive virus vaccine (FLVIRIN/FLUZONE) injection SUSP 0.5 mL  0.5 mL Intramuscular Once Fayrene Helper, MD        Allergies as of 04/05/2013  . (No Known Allergies)    Family History  Problem Relation Age of Onset  . Diabetes Mother   . Heart disease Mother   . Colon cancer Father     Diagnosed at age 30, deceased at 66  . Diabetes Sister     X 5  . Heart disease Sister     X 2  . Hypertension Sister     X 7  . Diabetes Brother     X 1  . Cancer Brother     LUNG BRAIN AND THROAT    History   Social History  . Marital Status: Married    Spouse Name: N/A    Number of Children: N/A  . Years of Education: N/A   Occupational History  . Not on file.   Social History Main Topics  . Smoking status: Former Research scientist (life sciences)  . Smokeless tobacco: Not on file  . Alcohol Use: No  . Drug Use: No  .  Sexual Activity: No   Other Topics Concern  . Not on file   Social History Narrative  . No narrative on file    Review of Systems: Gen: Denies any fever, chills, fatigue, weight loss, lack of appetite.  CV: Denies chest pain, heart palpitations, peripheral edema, syncope.  Resp: Denies shortness of breath at rest or with exertion. Denies wheezing or cough.  GI: Denies dysphagia or odynophagia. Denies jaundice, hematemesis, fecal incontinence. GU : Denies urinary burning, urinary frequency, urinary hesitancy MS: back pain Derm: Denies rash, itching, dry skin Psych: Denies depression, anxiety, memory loss, and confusion Heme: Denies bruising, bleeding, and enlarged lymph nodes.  Physical Exam: BP 129/76  Pulse 63  Temp(Src) 97.6 F (36.4 C) (Oral)  Wt 206 lb 12.8 oz (93.804 kg) General:   Alert and oriented. Pleasant and cooperative. Well-nourished and well-developed.  Head:   Normocephalic and atraumatic. Eyes:  Without icterus, sclera clear and conjunctiva pink.  Ears:  Normal auditory acuity. Nose:  No deformity, discharge,  or lesions. Mouth:  No deformity or lesions, oral mucosa pink.  Neck:  Supple, without mass or thyromegaly. Lungs:  Clear to auscultation bilaterally. No wheezes, rales, or rhonchi. No distress.  Heart:  S1, S2 present without murmurs appreciated.  Abdomen:  +BS, soft, non-tender and non-distended. No HSM noted. No guarding or rebound. No masses appreciated.  Rectal:  Deferred  Msk:  Symmetrical without gross deformities. Normal posture. Extremities:  Without clubbing or edema. Neurologic:  Alert and  oriented x4;  grossly normal neurologically. Skin:  Intact without significant lesions or rashes. Cervical Nodes:  No significant cervical adenopathy. Psych:  Alert and cooperative. Normal mood and affect.

## 2013-04-21 NOTE — Interval H&P Note (Signed)
History and Physical Interval Note:  04/21/2013 9:58 AM  Shannon Romero  has presented today for surgery, with the diagnosis of FAMILY HISTORY OF COLON CANCER  The various methods of treatment have been discussed with the patient and family. After consideration of risks, benefits and other options for treatment, the patient has consented to  Procedure(s) with comments: COLONOSCOPY (N/A) - 10:00 as a surgical intervention .  The patient's history has been reviewed, patient examined, no change in status, stable for surgery.  I have reviewed the patient's chart and labs.  Questions were answered to the patient's satisfaction.    No change. Colonoscopy per plan. Colonoscopy Discharge Instructions  Read the instructions outlined below and refer to this sheet in the next few weeks. These discharge instructions provide you with general information on caring for yourself after you leave the hospital. Your doctor may also give you specific instructions. While your treatment has been planned according to the most current medical practices available, unavoidable complications occasionally occur. If you have any problems or questions after discharge, call Dr. Gala Romney at [phone]. ACTIVITY  You may resume your regular activity, but move at a slower pace for the next 24 hours.   Take frequent rest periods for the next 24 hours.   Walking will help get rid of the air and reduce the bloated feeling in your belly (abdomen).   No driving for 24 hours (because of the medicine (anesthesia) used during the test).    Do not sign any important legal documents or operate any machinery for 24 hours (because of the anesthesia used during the test).  NUTRITION  Drink plenty of fluids.   You may resume your normal diet as instructed by your doctor.   Begin with a light meal and progress to your normal diet. Heavy or fried foods are harder to digest and may make you feel sick to your stomach (nauseated).   Avoid  alcoholic beverages for 24 hours or as instructed.  MEDICATIONS  You may resume your normal medications unless your doctor tells you otherwise.  WHAT YOU CAN EXPECT TODAY  Some feelings of bloating in the abdomen.   Passage of more gas than usual.   Spotting of blood in your stool or on the toilet paper.  IF YOU HAD POLYPS REMOVED DURING THE COLONOSCOPY:  No aspirin products for 7 days or as instructed.   No alcohol for 7 days or as instructed.   Eat a soft diet for the next 24 hours.  FINDING OUT THE RESULTS OF YOUR TEST Not all test results are available during your visit. If your test results are not back during the visit, make an appointment with your caregiver to find out the results. Do not assume everything is normal if you have not heard from your caregiver or the medical facility. It is important for you to follow up on all of your test results.  SEEK IMMEDIATE MEDICAL ATTENTION IF:  You have more than a spotting of blood in your stool.   Your belly is swollen (abdominal distention).   You are nauseated or vomiting.   You have a temperature over 101.  You have abdominal pain or discomfort that is severe or gets worse throughout the day.    Manus Rudd

## 2013-04-21 NOTE — Op Note (Signed)
[facility] [address] [place], [address]   COLONOSCOPY PROCEDURE REPORT  PATIENT: Shannon Romero, Shannon Romero  MR#:         [id number] BIRTHDATE: Apr 03, 1946 , 67  yrs. old GENDER: Female ENDOSCOPIST: R.  Garfield Cornea, MD FACP FACG REFERRED BY:  [name], M.D. PROCEDURE DATE:  04/21/2013 PROCEDURE:     Colonoscopy with snare polypectomy  INDICATIONS: Positive family history of colon cancer  INFORMED CONSENT:  The risks, benefits, alternatives and imponderables including but not limited to bleeding, perforation as well as the possibility of a missed lesion have been reviewed.  The potential for biopsy, lesion removal, etc. have also been discussed.  Questions have been answered.  All parties agreeable. Please see the history and physical in the medical record for more information.  MEDICATIONS: Versed 6 mg IV and Demerol 100 mg IV in divided doses. Zofran 4 mg IV  DESCRIPTION OF PROCEDURE:  After a digital rectal exam was performed, the EC-[id number] ([id number])  colonoscope was advanced from the anus through the rectum and colon to the area of the cecum, ileocecal valve and appendiceal orifice.  The cecum was deeply intubated.  These structures were well-seen and photographed for the record.  From the level of the cecum and ileocecal valve, the scope was slowly and cautiously withdrawn.  The mucosal surfaces were carefully surveyed utilizing scope tip deflection to facilitate fold flattening as needed.  The scope was pulled down into the rectum where a thorough examination including retroflexion was performed.    FINDINGS:  Adequate preparation. Anal papilla and internal hemorrhoids; otherwise, normal rectum. Few scattered pancolonic diverticula; (1) 4 mm polyp in the mid descending segment; otherwise, the remainder of the colonic mucosa appeared normal.  THERAPEUTIC / DIAGNOSTIC MANEUVERS PERFORMED:  The above-mentioned polyp was cold snare removed and  recovered for the pathologist  COMPLICATIONS: None CECAL WITHDRAWAL TIME:  9 minutes  IMPRESSION:  Colonic polyp-removed as described above. Colonic diverticulosis  RECOMMENDATIONS: Followup on pathology. Further recommendations to follow   _______________________________ eSigned:  R. Garfield Cornea, MD FACP [facility] 04/21/2013 10:41 AM   CC:

## 2013-04-25 ENCOUNTER — Encounter (HOSPITAL_COMMUNITY): Payer: Self-pay | Admitting: Internal Medicine

## 2013-04-26 ENCOUNTER — Encounter: Payer: Self-pay | Admitting: Internal Medicine

## 2013-04-27 ENCOUNTER — Ambulatory Visit
Admission: RE | Admit: 2013-04-27 | Discharge: 2013-04-27 | Disposition: A | Discharge status: Home / Self Care | Payer: Medicare HMO | Source: Ambulatory Visit | Attending: Family Medicine | Admitting: Family Medicine

## 2013-04-27 DIAGNOSIS — R921 Mammographic calcification found on diagnostic imaging of breast: Secondary | ICD-10-CM

## 2013-04-28 ENCOUNTER — Other Ambulatory Visit: Payer: Self-pay | Admitting: Family Medicine

## 2013-04-28 DIAGNOSIS — D0510 Intraductal carcinoma in situ of unspecified breast: Secondary | ICD-10-CM

## 2013-05-06 ENCOUNTER — Other Ambulatory Visit: Payer: Medicare HMO

## 2013-05-08 ENCOUNTER — Ambulatory Visit
Admission: RE | Admit: 2013-05-08 | Discharge: 2013-05-08 | Disposition: A | Discharge status: Home / Self Care | Payer: Medicare HMO | Source: Ambulatory Visit | Attending: Family Medicine | Admitting: Family Medicine

## 2013-05-08 DIAGNOSIS — D0510 Intraductal carcinoma in situ of unspecified breast: Secondary | ICD-10-CM

## 2013-05-08 MED ORDER — GADOBENATE DIMEGLUMINE 529 MG/ML IV SOLN
19.0000 mL | Freq: Once | INTRAVENOUS | Status: AC | PRN
  Administered 2013-05-08: 19 mL via INTRAVENOUS

## 2013-05-18 ENCOUNTER — Encounter: Payer: PRIVATE HEALTH INSURANCE | Admitting: Family Medicine

## 2013-05-23 ENCOUNTER — Telehealth: Payer: Self-pay | Admitting: Family Medicine

## 2013-05-23 NOTE — Telephone Encounter (Signed)
Noted  

## 2013-05-25 ENCOUNTER — Other Ambulatory Visit (HOSPITAL_COMMUNITY): Payer: Self-pay | Admitting: General Surgery

## 2013-05-25 ENCOUNTER — Encounter (HOSPITAL_COMMUNITY): Payer: Self-pay | Admitting: Pharmacy Technician

## 2013-05-25 DIAGNOSIS — C50912 Malignant neoplasm of unspecified site of left female breast: Secondary | ICD-10-CM

## 2013-05-25 DIAGNOSIS — C50919 Malignant neoplasm of unspecified site of unspecified female breast: Secondary | ICD-10-CM

## 2013-05-25 NOTE — H&P (Signed)
  NTS SOAP Note  Vital Signs:  Vitals as of: 06/19/7562: Systolic 332: Diastolic 66: Heart Rate 71: Temp 98.35F: Height 15ft 4in: Weight 205Lbs 0 Ounces: BMI 35.19  BMI : 35.19 kg/m2  Subjective: This 89 Years 55 Months old Female presents for of a left breast cancer.  Was found on routine mammography.  Biopsy proven invasive cancer of upper outer quadrant.  MRI shows greatest diameter 2cm.  No nipple discharge, family h/o breast cancer.  Review of Symptoms:  Constitutional:unremarkable   Head:unremarkable    Eyes:unremarkable   Nose/Mouth/Throat:unremarkable Cardiovascular:  unremarkable   Respiratory:unremarkable   Gastrointestinal:  unremarkable   Genitourinary:unremarkable     Musculoskeletal:unremarkable   Skin:unremarkable   see above Hematolgic/Lymphatic:unremarkable       hay fever   Past Medical History:    Reviewed  Past Medical History  Surgical History: BTL Medical Problems: NIDDM, HTN Allergies: nkda Medications: metformin, lisinopril, meloxicam, baby asa, fish oil   Social History:Reviewed  Social History  Preferred Language: English Race:  Black or African American Ethnicity: Not Hispanic / Latino Age: 24 Years 0 Months Marital Status:  M Alcohol: no Recreational drug(s): no   Smoking Status: Unknown if ever smoked reviewed on 05/18/2013 Functional Status reviewed on 05/18/2013 ------------------------------------------------ Bathing: Normal Cooking: Normal Dressing: Normal Driving: Normal Eating: Normal Managing Meds: Normal Oral Care: Normal Shopping: Normal Toileting: Normal Transferring: Normal Walking: Normal Cognitive Status reviewed on 05/18/2013 ------------------------------------------------ Attention: Normal Decision Making: Normal Language: Normal Memory: Normal Motor: Normal Perception: Normal Problem Solving: Normal Visual and Spatial: Normal   Family History:   Reviewed  Family Health History Mother, Deceased; Colon cancer;  Father, Deceased; Lung cancer;     Objective Information: General:  Well appearing, well nourished in no distress. Neck:  Supple without lymphadenopathy.  Heart:  RRR, no murmur or gallop.  Normal S1, S2.  No S3, S4.  Lungs:    CTA bilaterally, no wheezes, rhonchi, rales.  Breathing unlabored.     No dominant mass, nipple discharge, dimpling in either breast.  Axillas negative for palpable nodes.  MRI, mammogram, path report reviewed.  Assessment:Left breast carcinoma  Diagnoses: 174.9 Primary malignant neoplasm of female breast (Malignant neoplasm of unspecified site of left female breast)  Procedures: [phone] - OFFICE OUTPATIENT NEW 30 MINUTES    Plan:  Discussed options including left modified radical mastecetomy vs left partial mastectomy/sentinel lymph node biopsy/possible axillary dissection/XRT.  Both will be equally effective in treating the breast cancer.  All questions answered.  Literature given.  Has decided to proceed with left partial mastectomy after needle localization, sentinel lymph node biopsy, possible axillary dissection.  Patient Education:Alternative treatments to surgery were discussed with patient (and family).  Risks and benefits  of procedure were fully explained to the patient (and family) who gave informed consent. Patient/family questions were addressed.  Follow-up:Weeks 1

## 2013-06-01 ENCOUNTER — Other Ambulatory Visit: Payer: Self-pay | Admitting: Family Medicine

## 2013-06-01 NOTE — Patient Instructions (Signed)
Your procedure is scheduled on: 06/07/2013  Report to [facility] at  7:00   AM.  Call this number if you have problems the morning of surgery: [phone]   Remember:   Do not drink or eat food:After Midnight.  :  Take these medicines the morning of surgery with A SIP OF WATER: Lisinopril   Do not wear jewelry, make-up or nail polish.  Do not wear lotions, powders, or perfumes. You may wear deodorant.  Do not shave 48 hours prior to surgery. Men may shave face and neck.  Do not bring valuables to the hospital.  Contacts, dentures or bridgework may not be worn into surgery.  Leave suitcase in the car. After surgery it may be brought to your room.  For patients admitted to the hospital, checkout time is 11:00 AM the day of discharge.   Patients discharged the day of surgery will not be allowed to drive home.    Special Instructions: Shower using CHG night before surgery and shower the day of surgery use CHG.  Use special wash - you have one bottle of CHG for all showers.  You should use approximately 1/2 of the bottle for each shower.   Please read over the following fact sheets that you were given: Pain Booklet, MRSA Information, Surgical Site Infection Prevention and Care and Recovery After Surgery  Sentinel Lymph Node Analysis in Breast Cancer Treatment WHAT IS A LYMPH NODE? Lymph nodes are little glands that lie along the lymph channels and serve to trap infections in the body. These are the small vessel-like structures that carry the fluid (lymph) from body tissues away to be filtered. These are the "glands" that feel swollen in the neck when you or your child has a sore throat. These glands in your armpit are where breast cancer tends to spread first. WHAT IS SENTINEL LYMPH NODE ANALYSIS? Sentinel lymph node study is a new cancer diagnostic procedure. It aims to look at the most likely first spread of breast cancer. It involves looking at the lymph node or nodes where breast cancer is  likely to travel next.  PROCEDURE  A small amount of blue dye and radioactive tracer are injected around the tumor in the breast. The dye and tracer follow the same path that a spreading cancer would be likely to follow. With the use of a IT sales professional, the radioactive tracer can be located in the lymph node that is the gatekeeper to other lymph nodes in the armpit. The sentinel lymph node is the first lymph node in a chain of lymph nodes that drain the lymph from the breast. The blue dye enables the surgeon to identify this sentinel node. This node can be removed and examined. If no cancer is found in this node, no further removal of lymph nodes is recommended. In this case, the spread of cancer to the other lymph nodes is rare. This eliminates any more surgery in the armpit and risk of complications. If the lymph node shows spread of the cancer from the breast, the other lymph nodes in the armpit are removed and analyzed. This helps the doctor and patient decide how much more surgery is needed and if chemotherapy and/or radiation treatment is necessary following the surgery.  BENEFITS  The pathology tests for this procedure are much more sensitive than was previously available.  This technique is thought to be a major improvement in the treatment of breast cancer.  This procedure allows many patients to avoid the effects  of more extensive underarm lymph node removal and risk of complications (infection, bleeding, or severe arm swelling).  Survival rates from breast cancer are better and the risk of complications isreduced. Document Released: 03/30/2005 Document Revised: 06/22/2011 Document Reviewed: 12/14/2006 [facility] Patient Information 2014 [place]. Segmental Mastectomy and Axillary Lymph Node Dissection Care After These discharge instructions provide you with general information on caring for yourself after you leave the hospital. While your treatment has been planned according to the  most current medical practices available, unavoidable complications occasionally occur. It is important that you know the warning signs of complications so that you can seek treatment. Please read the instructions outlined below and refer to this sheet in the next few weeks. Your doctor may also give you specific information. If you have any complications or questions after discharge, please call your doctor.  ACTIVITY  Your doctor will tell you when you may resume strenuous activities, driving and sports. After the drain(s) are removed, you may do light housework, but avoid heavy lifting, carrying or pushing (you should not be lifting anything heavier than 5 lbs.).  Take frequent rest periods. You may tire more easily than usual. Always rest and elevate the arm affected by your surgery for a period of time equal to your activity time.  Continue doing the exercises given to you by the physical therapist/occupational therapist even after full range of motion has returned. The amount of time this takes will vary from person to person. Generally, after normal range of motion has returned, some stiffness and soreness may persist for 2-3 months. This is normal and should subside.  Begin sports or strenuous activities in moderation, giving you a chance to rebuild your endurance.  Continue to be cautious of heavy lifting or carrying (not more than 10 lbs.) with the affected arm. You may return to work as recommended by your doctor. NUTRITION  You may resume your normal diet.  Make sure you drink plenty of fluids (6-8 glasses per day).  Eat a well-balanced diet.  Daily portions of food from the grains, vegetables, fruits, milk, and meat and beans families are necessary for your health. You may visit [employer] for more dietary information. HYGIENE  You may wash your hair.  If your incision (the cut from surgery) is healed, you may shower or take a bath, unless instructed otherwise by  your caregiver. FEVER If you feel feverish or have shaking chills, take your temperature. If your temperature is 102 F (38.9 C) or above, call your caregiver. The fever may mean there is an infection. If you call early, infection can be treated with antibiotics and hospitalization may be avoided. PAIN CONTROL  Mild discomfort may occur. You may need to take an "over-the-counter" pain medication or a medication prescribed by your doctor.  Call your doctor if you experience increased pain. INCISION CARE Check your incision daily for increased redness, drainage, swelling or separation of skin edges. Call your doctor if any of the above are noted. DRAIN CARE If you have a drain in place, ask for instructions for "Surgical Drain Care". ARM AND HAND CARE  Since the lymph nodes under your arm have been removed, there may be a greater chance for the arm to swell.  Avoid, if possible, having blood pressures taken, blood drawn or injections given in the affected arm.  Use hand lotion to soften fingernail cuticles instead of cutting them to avoid cutting yourself.  Use an electric shaver to shave your underarms.  You may  use a deodorant after the incision has completely healed.  Be careful not to cut yourself when cooking, sewing or gardening.  Do not weigh your arm straight down with a package or your purse. Note: Follow the exercises and instructions given to you by the physical therapist/occupational therapist and your treating doctor.  Document Released: 11/12/2003 Document Revised: 06/22/2011 Document Reviewed: 06/21/2007 [facility] Patient Information 2014 [place], [place]. PATIENT INSTRUCTIONS POST-ANESTHESIA  IMMEDIATELY FOLLOWING SURGERY:  Do not drive or operate machinery for the first twenty four hours after surgery.  Do not make any important decisions for twenty four hours after surgery or while taking narcotic pain medications or sedatives.  If you develop intractable nausea and  vomiting or a severe headache please notify your doctor immediately.  FOLLOW-UP:  Please make an appointment with your surgeon as instructed. You do not need to follow up with anesthesia unless specifically instructed to do so.  WOUND CARE INSTRUCTIONS (if applicable):  Keep a dry clean dressing on the anesthesia/puncture wound site if there is drainage.  Once the wound has quit draining you may leave it open to air.  Generally you should leave the bandage intact for twenty four hours unless there is drainage.  If the epidural site drains for more than 36-48 hours please call the anesthesia department.  QUESTIONS?:  Please feel free to call your physician or the hospital operator if you have any questions, and they will be happy to assist you.

## 2013-06-02 ENCOUNTER — Ambulatory Visit (HOSPITAL_COMMUNITY)
Admission: RE | Admit: 2013-06-02 | Discharge: 2013-06-02 | Disposition: A | Discharge status: Home / Self Care | Payer: Medicare HMO | Source: Ambulatory Visit | Attending: General Surgery | Admitting: General Surgery

## 2013-06-02 ENCOUNTER — Encounter (HOSPITAL_COMMUNITY)
Admission: RE | Admit: 2013-06-02 | Discharge: 2013-06-02 | Disposition: A | Discharge status: Home / Self Care | Payer: Medicare HMO | Source: Ambulatory Visit | Attending: General Surgery | Admitting: General Surgery

## 2013-06-02 ENCOUNTER — Other Ambulatory Visit: Payer: Self-pay

## 2013-06-02 DIAGNOSIS — Z01810 Encounter for preprocedural cardiovascular examination: Secondary | ICD-10-CM | POA: Insufficient documentation

## 2013-06-02 DIAGNOSIS — C50919 Malignant neoplasm of unspecified site of unspecified female breast: Secondary | ICD-10-CM | POA: Insufficient documentation

## 2013-06-02 DIAGNOSIS — Z01812 Encounter for preprocedural laboratory examination: Secondary | ICD-10-CM | POA: Insufficient documentation

## 2013-06-02 DIAGNOSIS — Z01818 Encounter for other preprocedural examination: Secondary | ICD-10-CM | POA: Insufficient documentation

## 2013-06-02 LAB — CBC WITH DIFFERENTIAL/PLATELET
Basophils Absolute: 0 10*3/uL (ref 0.0–0.1)
Basophils Relative: 0 % (ref 0–1)
EOS PCT: 2 % (ref 0–5)
Eosinophils Absolute: 0.2 10*3/uL (ref 0.0–0.7)
HCT: 37.1 % (ref 36.0–46.0)
HEMOGLOBIN: 12.2 g/dL (ref 12.0–15.0)
Lymphocytes Relative: 48 % — ABNORMAL HIGH (ref 12–46)
Lymphs Abs: 4.8 10*3/uL — ABNORMAL HIGH (ref 0.7–4.0)
MCH: 27.5 pg (ref 26.0–34.0)
MCHC: 32.9 g/dL (ref 30.0–36.0)
MCV: 83.7 fL (ref 78.0–100.0)
MONOS PCT: 5 % (ref 3–12)
Monocytes Absolute: 0.5 10*3/uL (ref 0.1–1.0)
Neutro Abs: 4.5 10*3/uL (ref 1.7–7.7)
Neutrophils Relative %: 45 % (ref 43–77)
Platelets: 285 10*3/uL (ref 150–400)
RBC: 4.43 MIL/uL (ref 3.87–5.11)
RDW: 16.6 % — ABNORMAL HIGH (ref 11.5–15.5)
WBC: 10.1 10*3/uL (ref 4.0–10.5)

## 2013-06-02 LAB — COMPREHENSIVE METABOLIC PANEL
ALK PHOS: 76 U/L (ref 39–117)
ALT: 14 U/L (ref 0–35)
AST: 13 U/L (ref 0–37)
Albumin: 3.6 g/dL (ref 3.5–5.2)
BUN: 21 mg/dL (ref 6–23)
CALCIUM: 10.4 mg/dL (ref 8.4–10.5)
CO2: 27 meq/L (ref 19–32)
Chloride: 103 mEq/L (ref 96–112)
Creatinine, Ser: 1.01 mg/dL (ref 0.50–1.10)
GFR calc Af Amer: 66 mL/min — ABNORMAL LOW (ref >90)
GFR, EST NON AFRICAN AMERICAN: 57 mL/min — AB (ref >90)
Glucose, Bld: 124 mg/dL — ABNORMAL HIGH (ref 70–99)
Potassium: 4.3 mEq/L (ref 3.7–5.3)
SODIUM: 144 meq/L (ref 137–147)
Total Bilirubin: 0.2 mg/dL — ABNORMAL LOW (ref 0.3–1.2)
Total Protein: 7.7 g/dL (ref 6.0–8.3)

## 2013-06-07 ENCOUNTER — Ambulatory Visit (HOSPITAL_COMMUNITY): Payer: Medicare HMO | Admitting: Anesthesiology

## 2013-06-07 ENCOUNTER — Ambulatory Visit (HOSPITAL_COMMUNITY)
Admission: RE | Admit: 2013-06-07 | Discharge: 2013-06-07 | Disposition: A | Discharge status: Home / Self Care | Payer: Medicare HMO | Source: Ambulatory Visit | Attending: General Surgery | Admitting: General Surgery

## 2013-06-07 ENCOUNTER — Encounter (HOSPITAL_COMMUNITY): Payer: Medicare HMO | Admitting: Anesthesiology

## 2013-06-07 ENCOUNTER — Encounter (HOSPITAL_COMMUNITY)
Admission: RE | Disposition: A | Discharge status: Home / Self Care | Payer: Self-pay | Source: Ambulatory Visit | Attending: General Surgery

## 2013-06-07 ENCOUNTER — Encounter (HOSPITAL_COMMUNITY): Payer: Self-pay | Admitting: *Deleted

## 2013-06-07 ENCOUNTER — Ambulatory Visit (HOSPITAL_COMMUNITY): Payer: Medicare HMO

## 2013-06-07 ENCOUNTER — Encounter (HOSPITAL_COMMUNITY)
Admission: RE | Admit: 2013-06-07 | Discharge: 2013-06-07 | Disposition: A | Discharge status: Home / Self Care | Payer: Medicare HMO | Source: Ambulatory Visit | Attending: General Surgery | Admitting: General Surgery

## 2013-06-07 DIAGNOSIS — Z79899 Other long term (current) drug therapy: Secondary | ICD-10-CM | POA: Insufficient documentation

## 2013-06-07 DIAGNOSIS — I10 Essential (primary) hypertension: Secondary | ICD-10-CM | POA: Insufficient documentation

## 2013-06-07 DIAGNOSIS — C50919 Malignant neoplasm of unspecified site of unspecified female breast: Secondary | ICD-10-CM | POA: Insufficient documentation

## 2013-06-07 DIAGNOSIS — E119 Type 2 diabetes mellitus without complications: Secondary | ICD-10-CM | POA: Insufficient documentation

## 2013-06-07 DIAGNOSIS — Z87891 Personal history of nicotine dependence: Secondary | ICD-10-CM | POA: Insufficient documentation

## 2013-06-07 DIAGNOSIS — F329 Major depressive disorder, single episode, unspecified: Secondary | ICD-10-CM | POA: Insufficient documentation

## 2013-06-07 DIAGNOSIS — F3289 Other specified depressive episodes: Secondary | ICD-10-CM | POA: Insufficient documentation

## 2013-06-07 DIAGNOSIS — C50912 Malignant neoplasm of unspecified site of left female breast: Secondary | ICD-10-CM

## 2013-06-07 HISTORY — PX: PARTIAL MASTECTOMY WITH NEEDLE LOCALIZATION AND AXILLARY SENTINEL LYMPH NODE BX: SHX6009

## 2013-06-07 LAB — GLUCOSE, CAPILLARY: Glucose-Capillary: 102 mg/dL — ABNORMAL HIGH (ref 70–99)

## 2013-06-07 SURGERY — PARTIAL MASTECTOMY WITH NEEDLE LOCALIZATION AND AXILLARY SENTINEL LYMPH NODE BX
Anesthesia: General | Site: Breast | Laterality: Left

## 2013-06-07 MED ORDER — MIDAZOLAM HCL 2 MG/2ML IJ SOLN
INTRAMUSCULAR | Status: AC
  Filled 2013-06-07: qty 2

## 2013-06-07 MED ORDER — SUCCINYLCHOLINE CHLORIDE 20 MG/ML IJ SOLN
INTRAMUSCULAR | Status: AC
  Filled 2013-06-07: qty 1

## 2013-06-07 MED ORDER — TECHNETIUM TC 99M SULFUR COLLOID FILTERED
0.5000 | Freq: Once | INTRAVENOUS | Status: AC | PRN
  Administered 2013-06-07: 0.5 via INTRADERMAL

## 2013-06-07 MED ORDER — GLYCOPYRROLATE 0.2 MG/ML IJ SOLN
INTRAMUSCULAR | Status: AC
  Filled 2013-06-07: qty 2

## 2013-06-07 MED ORDER — LIDOCAINE HCL (PF) 2 % IJ SOLN
INTRAMUSCULAR | Status: AC
  Filled 2013-06-07: qty 10

## 2013-06-07 MED ORDER — PROPOFOL 10 MG/ML IV EMUL
INTRAVENOUS | Status: AC
  Filled 2013-06-07: qty 20

## 2013-06-07 MED ORDER — FENTANYL CITRATE 0.05 MG/ML IJ SOLN
25.0000 ug | INTRAMUSCULAR | Status: AC
  Administered 2013-06-07 (×2): 25 ug via INTRAVENOUS

## 2013-06-07 MED ORDER — PROPOFOL 10 MG/ML IV BOLUS
INTRAVENOUS | Status: DC | PRN
  Administered 2013-06-07: 150 mg via INTRAVENOUS
  Administered 2013-06-07: 20 mg via INTRAVENOUS

## 2013-06-07 MED ORDER — ENOXAPARIN SODIUM 40 MG/0.4ML ~~LOC~~ SOLN
SUBCUTANEOUS | Status: AC
  Filled 2013-06-07: qty 0.4

## 2013-06-07 MED ORDER — ONDANSETRON HCL 4 MG/2ML IJ SOLN: 4.0000 mg | Freq: Once | INTRAMUSCULAR | Status: DC | PRN

## 2013-06-07 MED ORDER — LIDOCAINE HCL 1 % IJ SOLN
INTRAMUSCULAR | Status: DC | PRN
  Administered 2013-06-07: 40 mg via INTRADERMAL

## 2013-06-07 MED ORDER — GLYCOPYRROLATE 0.2 MG/ML IJ SOLN
INTRAMUSCULAR | Status: DC | PRN
  Administered 2013-06-07: .3 mg via INTRAVENOUS

## 2013-06-07 MED ORDER — FENTANYL CITRATE 0.05 MG/ML IJ SOLN
25.0000 ug | INTRAMUSCULAR | Status: DC | PRN
  Administered 2013-06-07 (×2): 50 ug via INTRAVENOUS
  Filled 2013-06-07: qty 2

## 2013-06-07 MED ORDER — FENTANYL CITRATE 0.05 MG/ML IJ SOLN
INTRAMUSCULAR | Status: AC
  Filled 2013-06-07: qty 2

## 2013-06-07 MED ORDER — SODIUM CHLORIDE 0.9 % IJ SOLN
INTRAMUSCULAR | Status: DC | PRN
  Administered 2013-06-07: 3 mL via INTRAVENOUS

## 2013-06-07 MED ORDER — LIDOCAINE HCL (PF) 1 % IJ SOLN
INTRAMUSCULAR | Status: AC
  Filled 2013-06-07: qty 5

## 2013-06-07 MED ORDER — HYDROCODONE-ACETAMINOPHEN 5-325 MG PO TABS: 1.0000 | ORAL_TABLET | ORAL | Status: DC | PRN

## 2013-06-07 MED ORDER — CHLORHEXIDINE GLUCONATE 4 % EX LIQD: 1.0000 "application " | Freq: Once | CUTANEOUS | Status: DC

## 2013-06-07 MED ORDER — PENTAFLUOROPROP-TETRAFLUOROETH EX AERO
INHALATION_SPRAY | CUTANEOUS | Status: AC
  Filled 2013-06-07: qty 103.5

## 2013-06-07 MED ORDER — CEFAZOLIN SODIUM-DEXTROSE 2-3 GM-% IV SOLR
2.0000 g | INTRAVENOUS | Status: AC
  Administered 2013-06-07: 2 g via INTRAVENOUS

## 2013-06-07 MED ORDER — 0.9 % SODIUM CHLORIDE (POUR BTL) OPTIME
TOPICAL | Status: DC | PRN
  Administered 2013-06-07: 1000 mL

## 2013-06-07 MED ORDER — CEFAZOLIN SODIUM-DEXTROSE 2-3 GM-% IV SOLR
INTRAVENOUS | Status: AC
  Filled 2013-06-07: qty 50

## 2013-06-07 MED ORDER — METHYLENE BLUE 1 % INJ SOLN
INTRAMUSCULAR | Status: AC
  Filled 2013-06-07: qty 1

## 2013-06-07 MED ORDER — ONDANSETRON HCL 4 MG/2ML IJ SOLN
INTRAMUSCULAR | Status: AC
  Filled 2013-06-07: qty 2

## 2013-06-07 MED ORDER — FENTANYL CITRATE 0.05 MG/ML IJ SOLN
INTRAMUSCULAR | Status: DC | PRN
  Administered 2013-06-07 (×5): 50 ug via INTRAVENOUS

## 2013-06-07 MED ORDER — MIDAZOLAM HCL 2 MG/2ML IJ SOLN
1.0000 mg | INTRAMUSCULAR | Status: DC | PRN
  Administered 2013-06-07: 2 mg via INTRAVENOUS

## 2013-06-07 MED ORDER — ENOXAPARIN SODIUM 40 MG/0.4ML ~~LOC~~ SOLN
40.0000 mg | Freq: Once | SUBCUTANEOUS | Status: AC
  Administered 2013-06-07: 40 mg via SUBCUTANEOUS

## 2013-06-07 MED ORDER — METHYLENE BLUE 1 % INJ SOLN
INTRAMUSCULAR | Status: DC | PRN
  Administered 2013-06-07: 5 mL via INTRADERMAL

## 2013-06-07 MED ORDER — ROCURONIUM BROMIDE 50 MG/5ML IV SOLN
INTRAVENOUS | Status: AC
  Filled 2013-06-07: qty 1

## 2013-06-07 MED ORDER — ONDANSETRON HCL 4 MG/2ML IJ SOLN
4.0000 mg | Freq: Once | INTRAMUSCULAR | Status: AC
  Administered 2013-06-07: 4 mg via INTRAVENOUS

## 2013-06-07 MED ORDER — BUPIVACAINE HCL (PF) 0.5 % IJ SOLN
INTRAMUSCULAR | Status: AC
  Filled 2013-06-07: qty 30

## 2013-06-07 MED ORDER — ROCURONIUM BROMIDE 100 MG/10ML IV SOLN
INTRAVENOUS | Status: DC | PRN
  Administered 2013-06-07: 40 mg via INTRAVENOUS

## 2013-06-07 MED ORDER — SODIUM CHLORIDE 0.9 % IJ SOLN
INTRAMUSCULAR | Status: AC
  Filled 2013-06-07: qty 10

## 2013-06-07 MED ORDER — BUPIVACAINE HCL (PF) 0.5 % IJ SOLN
INTRAMUSCULAR | Status: DC | PRN
  Administered 2013-06-07: 17 mL

## 2013-06-07 MED ORDER — NEOSTIGMINE METHYLSULFATE 1 MG/ML IJ SOLN
INTRAMUSCULAR | Status: DC | PRN
  Administered 2013-06-07: 2 mg via INTRAVENOUS

## 2013-06-07 MED ORDER — FENTANYL CITRATE 0.05 MG/ML IJ SOLN
INTRAMUSCULAR | Status: AC
  Filled 2013-06-07: qty 5

## 2013-06-07 MED ORDER — LACTATED RINGERS IV SOLN
INTRAVENOUS | Status: DC
  Administered 2013-06-07 (×2): via INTRAVENOUS

## 2013-06-07 SURGICAL SUPPLY — 39 items
APPLIER CLIP 9.375 SM OPEN (CLIP) ×3
BAG HAMPER (MISCELLANEOUS) ×3 IMPLANT
CLIP APPLIE 9.375 SM OPEN (CLIP) ×1 IMPLANT
CLOTH BEACON ORANGE TIMEOUT ST (SAFETY) ×3 IMPLANT
CONT SPECI 4OZ STER CLIK (MISCELLANEOUS) ×3 IMPLANT
COVER LIGHT HANDLE STERIS (MISCELLANEOUS) ×6 IMPLANT
COVER PROBE W GEL 5X96 (DRAPES) ×3 IMPLANT
DECANTER SPIKE VIAL GLASS SM (MISCELLANEOUS) ×3 IMPLANT
DERMABOND ADVANCED (GAUZE/BANDAGES/DRESSINGS) ×2
DERMABOND ADVANCED .7 DNX12 (GAUZE/BANDAGES/DRESSINGS) ×1 IMPLANT
DURAPREP 26ML APPLICATOR (WOUND CARE) ×3 IMPLANT
ELECT REM PT RETURN 9FT ADLT (ELECTROSURGICAL) ×3
ELECTRODE REM PT RTRN 9FT ADLT (ELECTROSURGICAL) ×1 IMPLANT
GLOVE BIO SURGEON STRL SZ7.5 (GLOVE) ×3 IMPLANT
GLOVE BIOGEL PI IND STRL 7.0 (GLOVE) ×2 IMPLANT
GLOVE BIOGEL PI IND STRL 7.5 (GLOVE) ×2 IMPLANT
GLOVE BIOGEL PI INDICATOR 7.0 (GLOVE) ×4
GLOVE BIOGEL PI INDICATOR 7.5 (GLOVE) ×4
GLOVE ECLIPSE 7.0 STRL STRAW (GLOVE) ×3 IMPLANT
GLOVE SS BIOGEL STRL SZ 6.5 (GLOVE) ×1 IMPLANT
GLOVE SUPERSENSE BIOGEL SZ 6.5 (GLOVE) ×2
GOWN STRL REUS W/TWL LRG LVL3 (GOWN DISPOSABLE) ×9 IMPLANT
KIT ROOM TURNOVER APOR (KITS) ×3 IMPLANT
MANIFOLD NEPTUNE II (INSTRUMENTS) ×3 IMPLANT
NEEDLE HYPO 18GX1.5 BLUNT FILL (NEEDLE) ×3 IMPLANT
NEEDLE HYPO 25X1 1.5 SAFETY (NEEDLE) ×3 IMPLANT
NS IRRIG 1000ML POUR BTL (IV SOLUTION) ×3 IMPLANT
PACK MINOR (CUSTOM PROCEDURE TRAY) ×3 IMPLANT
PAD ARMBOARD 7.5X6 YLW CONV (MISCELLANEOUS) ×3 IMPLANT
SET BASIN LINEN APH (SET/KITS/TRAYS/PACK) ×3 IMPLANT
SPONGE GAUZE 4X4 12PLY (GAUZE/BANDAGES/DRESSINGS) ×3 IMPLANT
SPONGE LAP 18X18 X RAY DECT (DISPOSABLE) ×3 IMPLANT
STOCKINETTE IMPERVIOUS LG (DRAPES) ×3 IMPLANT
SUT SILK 0 FSL (SUTURE) ×3 IMPLANT
SUT VIC AB 3-0 SH 27 (SUTURE) ×2
SUT VIC AB 3-0 SH 27X BRD (SUTURE) ×1 IMPLANT
SUT VIC AB 4-0 PS2 27 (SUTURE) ×6 IMPLANT
SYR BULB IRRIGATION 50ML (SYRINGE) ×3 IMPLANT
SYR CONTROL 10ML LL (SYRINGE) ×3 IMPLANT

## 2013-06-07 NOTE — Op Note (Signed)
Patient:  DONISHA HOCH  DOB:  1945-04-17  MRN:  [id number]   Preop Diagnosis:  Left breast carcinoma  Postop Diagnosis:  Same  Procedure:  Left partial mastectomy after needle localization, sentinel lymph node biopsy of left axilla  Surgeon:  Aviva Signs, M.D.  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 After extensive discussion with the patient, she was elected to proceed with a left partial mastectomy after needle localization, sentinel lymph node biopsy, possible axillary dissection. She does note that she will receive radiation therapy after this. The risks and benefits of the procedures including bleeding, infection, nerve injury, the possibility of needing, back to the OR for incomplete margins were fully explained to the patient, who gave informed consent.  Procedure note:  The patient is placed the supine position. She had already undergone needle localization of left breast cancer as well as radioactive nuclide injection. After induction of general endotracheal anesthesia, the left breast and axilla were prepped and draped using usual sterile technique with DuraPrep. Surgical site confirmation was performed. Blue dye was then instilled into the surrounding region of the skin over the cancer. It was massaged in for 5 minutes.  A first turned my attention to the left axilla. Using a neoprobe, an incision was made into the left breast and the dissection was taken down to the axillary tissue. Using the neoprobe, I was able to find 3 sentinel lymph nodes, 2 of which were blue. Care was taken to avoid the thoracic or dorsal artery vein and nerve as well as the intercostal brachial nerve. The 3 lymph nodes were sent to pathology for frozen section. Frozen section was negative for malignancy. Any bleeding was controlled using small clips. The subcutaneous tissue was  reapproximated using 3-0 Vicryl interrupted sutures. The skin was closed a 4 Vicryl subcuticular suture.  Next, the left partial mastectomy after needle localization was performed. An incision was made to include the needle. I then followed the guidewire down to its tip. A generous amount of breast tissue was taken around the needle, which was close to the chest wall. Once this tissue was removed, a short suture was placed superiorly and a long suture placed laterally for orientation purposes. The specimen was sent to x-ray for specimen radiography. The clip and tumor were within the specimen removed. The specimen was then sent to pathology further examination. Any bleeding was controlled using Bovie electrocautery. The wound was irrigated normal saline. The skin was closed using a 4-0 Vicryl subcuticular suture. Both incisions were then closed with Dermabond.  All tape and needle counts were correct at the end of the procedure. The patient was extubated in the operating room and transferred to PACU in stable condition.  Complications:  None  EBL:  30 cc  Specimen:  Sentinel lymph nodes, left axilla  Left breast tissue, short suture superior, long suture lateral

## 2013-06-07 NOTE — OR Nursing (Signed)
Specimen taken to lab by Cheral Marker RN and Maudie Mercury with pathology notified

## 2013-06-07 NOTE — Anesthesia Procedure Notes (Signed)
Procedure Name: Intubation Date/Time: 06/07/2013 10:45 AM Performed by: Tressie Stalker E Pre-anesthesia Checklist: Patient identified, Patient being monitored, Timeout performed, Emergency Drugs available and Suction available Patient Re-evaluated:Patient Re-evaluated prior to inductionOxygen Delivery Method: Circle System Utilized Preoxygenation: Pre-oxygenation with 100% oxygen Intubation Type: IV induction Ventilation: Mask ventilation without difficulty Laryngoscope Size: Mac and 3 Grade View: Grade I Tube type: Oral Tube size: 7.0 mm Number of attempts: 1 Airway Equipment and Method: stylet Placement Confirmation: ETT inserted through vocal cords under direct vision,  positive ETCO2 and breath sounds checked- equal and bilateral Secured at: 21 cm Tube secured with: Tape Dental Injury: Teeth and Oropharynx as per pre-operative assessment

## 2013-06-07 NOTE — Anesthesia Preprocedure Evaluation (Signed)
Anesthesia Evaluation  Patient identified by MRN, date of birth, ID band Patient awake    Reviewed: Allergy & Precautions, H&P , NPO status , Patient's Chart, lab work & pertinent test results  Airway Mallampati: II TM Distance: >3 FB Neck ROM: Full    Dental  (+) Edentulous Upper, Edentulous Lower   Pulmonary former smoker,  breath sounds clear to auscultation        Cardiovascular hypertension, Pt. on medications Rhythm:Regular Rate:Normal     Neuro/Psych Depression    GI/Hepatic GERD-  Medicated,  Endo/Other  diabetes, Type 2, Oral Hypoglycemic Agents  Renal/GU      Musculoskeletal   Abdominal   Peds  Hematology   Anesthesia Other Findings   Reproductive/Obstetrics                           Anesthesia Physical Anesthesia Plan  ASA: III  Anesthesia Plan: General   Post-op Pain Management:    Induction: Intravenous, Rapid sequence and Cricoid pressure planned  Airway Management Planned: Oral ETT  Additional Equipment:   Intra-op Plan:   Post-operative Plan: Extubation in OR  Informed Consent: I have reviewed the patients History and Physical, chart, labs and discussed the procedure including the risks, benefits and alternatives for the proposed anesthesia with the patient or authorized representative who has indicated his/her understanding and acceptance.     Plan Discussed with:   Anesthesia Plan Comments:         Anesthesia Quick Evaluation

## 2013-06-07 NOTE — Discharge Instructions (Signed)
Segmental Mastectomy and Axillary Lymph Node Dissection °Care After °These discharge instructions provide you with general information on caring for yourself after you leave the hospital. While your treatment has been planned according to the most current medical practices available, unavoidable complications occasionally occur. It is important that you know the warning signs of complications so that you can seek treatment. Please read the instructions outlined below and refer to this sheet in the next few weeks. Your doctor may also give you specific information. If you have any complications or questions after discharge, please call your doctor.  °ACTIVITY °· Your doctor will tell you when you may resume strenuous activities, driving and sports. After the drain(s) are removed, you may do light housework, but avoid heavy lifting, carrying or pushing (you should not be lifting anything heavier than 5 lbs.). °· Take frequent rest periods. You may tire more easily than usual. Always rest and elevate the arm affected by your surgery for a period of time equal to your activity time. °· Continue doing the exercises given to you by the physical therapist/occupational therapist even after full range of motion has returned. The amount of time this takes will vary from person to person. Generally, after normal range of motion has returned, some stiffness and soreness may persist for 2-3 months. This is normal and should subside. °· Begin sports or strenuous activities in moderation, giving you a chance to rebuild your endurance. °· Continue to be cautious of heavy lifting or carrying (not more than 10 lbs.) with the affected arm. You may return to work as recommended by your doctor. °NUTRITION °· You may resume your normal diet. °· Make sure you drink plenty of fluids (6-8 glasses per day). °· Eat a well-balanced diet. °· Daily portions of food from the grains, vegetables, fruits, milk, and meat and beans families are  necessary for your health. You may visit http://fnic.nal.usda.gov/ for more dietary information. °HYGIENE °· You may wash your hair. °· If your incision (the cut from surgery) is healed, you may shower or take a bath, unless instructed otherwise by your caregiver. °FEVER °If you feel feverish or have shaking chills, take your temperature. If your temperature is 102° F (38.9° C) or above, call your caregiver. The fever may mean there is an infection. If you call early, infection can be treated with antibiotics and hospitalization may be avoided. °PAIN CONTROL °· Mild discomfort may occur. You may need to take an "over-the-counter" pain medication or a medication prescribed by your doctor. °· Call your doctor if you experience increased pain. °INCISION CARE °Check your incision daily for increased redness, drainage, swelling or separation of skin edges. Call your doctor if any of the above are noted. °DRAIN CARE °If you have a drain in place, ask for instructions for "Surgical Drain Care". °ARM AND HAND CARE °· Since the lymph nodes under your arm have been removed, there may be a greater chance for the arm to swell. °· Avoid, if possible, having blood pressures taken, blood drawn or injections given in the affected arm. °· Use hand lotion to soften fingernail cuticles instead of cutting them to avoid cutting yourself. °· Use an electric shaver to shave your underarms. °· You may use a deodorant after the incision has completely healed. °· Be careful not to cut yourself when cooking, sewing or gardening. °· Do not weigh your arm straight down with a package or your purse. °Note: Follow the exercises and instructions given to you by the physical   therapist/occupational therapist and your treating doctor.

## 2013-06-07 NOTE — Interval H&P Note (Signed)
History and Physical Interval Note:  06/07/2013 9:42 AM  Shannon Romero  has presented today for surgery, with the diagnosis of left breast cancer  The various methods of treatment have been discussed with the patient and family. After consideration of risks, benefits and other options for treatment, the patient has consented to  Procedure(s): PARTIAL MASTECTOMY WITH NEEDLE LOCALIZATION AND AXILLARY SENTINEL LYMPH NODE BX (Left) as a surgical intervention .  The patient's history has been reviewed, patient examined, no change in status, stable for surgery.  I have reviewed the patient's chart and labs.  Questions were answered to the patient's satisfaction.     Aviva Signs A

## 2013-06-07 NOTE — OR Nursing (Signed)
To Mammo for needle loc via w/c

## 2013-06-07 NOTE — Transfer of Care (Signed)
Immediate Anesthesia Transfer of Care Note  Patient: Shannon Romero  Procedure(s) Performed: Procedure(s): PARTIAL MASTECTOMY WITH NEEDLE LOCALIZATION AND AXILLARY SENTINEL LYMPH NODE BX (Left)  Patient Location: PACU  Anesthesia Type:General  Level of Consciousness: awake  Airway & Oxygen Therapy: Patient Spontanous Breathing and Patient connected to face mask oxygen  Post-op Assessment: Report given to PACU RN  Post vital signs: Reviewed and stable  Complications: No apparent anesthesia complications

## 2013-06-07 NOTE — Anesthesia Postprocedure Evaluation (Signed)
  Anesthesia Post-op Note  Patient: Shannon Romero  Procedure(s) Performed: Procedure(s): PARTIAL MASTECTOMY WITH NEEDLE LOCALIZATION AND AXILLARY SENTINEL LYMPH NODE BX (Left)  Patient Location: PACU  Anesthesia Type:General  Level of Consciousness: awake, alert  and oriented  Airway and Oxygen Therapy: Patient Spontanous Breathing  Post-op Pain: none  Post-op Assessment: Post-op Vital signs reviewed, Patient's Cardiovascular Status Stable, Respiratory Function Stable, Patent Airway, No signs of Nausea or vomiting and Pain level controlled  Post-op Vital Signs: Reviewed and stable  Complications: No apparent anesthesia complications

## 2013-06-07 NOTE — OR Nursing (Signed)
Spoke with Shannon Romero in [place] , informed that  Patient was here and ready for sentinel node injection she is located in preop bay 6

## 2013-06-09 ENCOUNTER — Encounter (HOSPITAL_COMMUNITY): Payer: Self-pay | Admitting: General Surgery

## 2013-06-14 ENCOUNTER — Telehealth: Payer: Self-pay | Admitting: Family Medicine

## 2013-06-14 ENCOUNTER — Other Ambulatory Visit: Payer: Self-pay

## 2013-06-14 MED ORDER — LISINOPRIL-HYDROCHLOROTHIAZIDE 20-25 MG PO TABS: ORAL_TABLET | ORAL | Status: DC

## 2013-06-14 MED ORDER — METFORMIN HCL 1000 MG PO TABS: ORAL_TABLET | ORAL | Status: DC

## 2013-06-14 NOTE — Telephone Encounter (Signed)
Sent in 1 refill 

## 2013-06-15 ENCOUNTER — Encounter (HOSPITAL_COMMUNITY): Payer: Self-pay | Admitting: Pharmacy Technician

## 2013-06-16 ENCOUNTER — Encounter (HOSPITAL_COMMUNITY)
Admission: RE | Admit: 2013-06-16 | Discharge: 2013-06-16 | Disposition: A | Discharge status: Home / Self Care | Payer: Medicare HMO | Source: Ambulatory Visit | Attending: General Surgery | Admitting: General Surgery

## 2013-06-16 ENCOUNTER — Encounter (HOSPITAL_COMMUNITY): Payer: Self-pay

## 2013-06-16 NOTE — H&P (Signed)
Shannon Romero is an 68 y.o. female.   Chief Complaint: Left breast carcinoma HPI: Patient is a 68 year old black female who underwent left partial mastectomy after needle localization, sentinel lymph node biopsy and was found to have tumor at the inked margin along the inferior lateral aspect of the specimen. She now presents for reexcision to get clear margins.  Past Medical History  Diagnosis Date  . Hypertension   . Diabetes mellitus   . GERD (gastroesophageal reflux disease)   . Bulging disc     Past Surgical History  Procedure Laterality Date  . Tubal ligation    . Ovarian cyst removal    . Colonoscopy  03/26/2008    NWG:NFAOZH rectum, scattered pancolonic diverticula and colonic/mucosa appeared normal, normal terminal ileum.  . Colonoscopy  02/28/2003    NUR: A few scattered small diverticula throughout the colon/ No evidence of colonic polyps or neoplasm/ Polyps or other lesions which potentially cause GI blood loss  . Colonoscopy N/A 04/21/2013    Procedure: COLONOSCOPY;  Surgeon: Daneil Dolin, MD;  Location: [facility] SUITE;  Service: Endoscopy;  Laterality: N/A;  10:00  . Partial mastectomy with needle localization and axillary sentinel lymph node bx Left 06/07/2013    Procedure: PARTIAL MASTECTOMY WITH NEEDLE LOCALIZATION AND AXILLARY SENTINEL LYMPH NODE BX;  Surgeon: Jamesetta So, MD;  Location: [facility] ORS;  Service: General;  Laterality: Left;    Family History  Problem Relation Age of Onset  . Diabetes Mother   . Heart disease Mother   . Colon cancer Father     Diagnosed at age 14, deceased at 85  . Diabetes Sister     X 5  . Heart disease Sister     X 2  . Hypertension Sister     X 7  . Diabetes Brother     X 1  . Cancer Brother     LUNG BRAIN AND THROAT   Social History:  reports that she has quit smoking. She does not have any smokeless tobacco history on file. She reports that she does not drink alcohol or use illicit drugs.  Allergies:  Allergies   Allergen Reactions  . Tramadol Itching    No prescriptions prior to admission    No results found for this or any previous visit (from the past 48 hour(s)). No results found.  Review of Systems  All other systems reviewed and are negative.    There were no vitals taken for this visit. Physical Exam  Constitutional: She is oriented to person, place, and time. She appears well-developed and well-nourished.  HENT:  Head: Normocephalic and atraumatic.  Neck: Normal range of motion. Neck supple.  Cardiovascular: Normal rate, regular rhythm and normal heart sounds.   Respiratory: Effort normal and breath sounds normal.  GI: Soft. Bowel sounds are normal.  Neurological: She is alert and oriented to person, place, and time.  Skin: Skin is warm and dry.   left breast examination reveals healed surgical scars in the left axilla and lower outer quadrant of the breast. Assessment/Plan Impression: Left breast carcinoma, tumor at inked margin Plan: Patient scheduled for reexcision of left breast cancer on 06/21/2013. The risks and benefits of the procedure were fully explained to the patient, who gave informed consent.  Shannon Romero A 06/16/2013, 7:27 AM

## 2013-06-21 ENCOUNTER — Ambulatory Visit (HOSPITAL_COMMUNITY)
Admission: RE | Admit: 2013-06-21 | Discharge: 2013-06-21 | Disposition: A | Discharge status: Home / Self Care | Payer: Medicare HMO | Source: Ambulatory Visit | Attending: General Surgery | Admitting: General Surgery

## 2013-06-21 ENCOUNTER — Encounter (HOSPITAL_COMMUNITY)
Admission: RE | Disposition: A | Discharge status: Home / Self Care | Payer: Self-pay | Source: Ambulatory Visit | Attending: General Surgery

## 2013-06-21 ENCOUNTER — Encounter (HOSPITAL_COMMUNITY): Payer: Self-pay | Admitting: *Deleted

## 2013-06-21 ENCOUNTER — Ambulatory Visit (HOSPITAL_COMMUNITY): Payer: Medicare HMO | Admitting: Anesthesiology

## 2013-06-21 ENCOUNTER — Encounter (HOSPITAL_COMMUNITY): Payer: Medicare HMO | Admitting: Anesthesiology

## 2013-06-21 DIAGNOSIS — Z79899 Other long term (current) drug therapy: Secondary | ICD-10-CM | POA: Insufficient documentation

## 2013-06-21 DIAGNOSIS — I10 Essential (primary) hypertension: Secondary | ICD-10-CM | POA: Insufficient documentation

## 2013-06-21 DIAGNOSIS — E119 Type 2 diabetes mellitus without complications: Secondary | ICD-10-CM | POA: Insufficient documentation

## 2013-06-21 DIAGNOSIS — C50919 Malignant neoplasm of unspecified site of unspecified female breast: Secondary | ICD-10-CM | POA: Insufficient documentation

## 2013-06-21 DIAGNOSIS — Z87891 Personal history of nicotine dependence: Secondary | ICD-10-CM | POA: Insufficient documentation

## 2013-06-21 HISTORY — PX: RE-EXCISION OF BREAST CANCER,SUPERIOR MARGINS: SHX6047

## 2013-06-21 LAB — GLUCOSE, CAPILLARY: Glucose-Capillary: 119 mg/dL — ABNORMAL HIGH (ref 70–99)

## 2013-06-21 SURGERY — RE-EXCISION OF BREAST CANCER,SUPERIOR MARGINS
Anesthesia: General | Site: Breast | Laterality: Left

## 2013-06-21 MED ORDER — MIDAZOLAM HCL 2 MG/2ML IJ SOLN
INTRAMUSCULAR | Status: AC
  Filled 2013-06-21: qty 2

## 2013-06-21 MED ORDER — 0.9 % SODIUM CHLORIDE (POUR BTL) OPTIME
TOPICAL | Status: DC | PRN
  Administered 2013-06-21: 1000 mL

## 2013-06-21 MED ORDER — PROPOFOL 10 MG/ML IV BOLUS
INTRAVENOUS | Status: AC
  Filled 2013-06-21: qty 20

## 2013-06-21 MED ORDER — MIDAZOLAM HCL 5 MG/5ML IJ SOLN
INTRAMUSCULAR | Status: DC | PRN
  Administered 2013-06-21: 2 mg via INTRAVENOUS

## 2013-06-21 MED ORDER — HYDROCODONE-ACETAMINOPHEN 5-325 MG PO TABS: 1.0000 | ORAL_TABLET | ORAL | Status: DC | PRN

## 2013-06-21 MED ORDER — PROPOFOL 10 MG/ML IV BOLUS
INTRAVENOUS | Status: DC | PRN
  Administered 2013-06-21: 30 mg via INTRAVENOUS
  Administered 2013-06-21: 130 mg via INTRAVENOUS

## 2013-06-21 MED ORDER — FENTANYL CITRATE 0.05 MG/ML IJ SOLN
INTRAMUSCULAR | Status: AC
  Filled 2013-06-21: qty 2

## 2013-06-21 MED ORDER — ONDANSETRON HCL 4 MG/2ML IJ SOLN
INTRAMUSCULAR | Status: AC
  Filled 2013-06-21: qty 2

## 2013-06-21 MED ORDER — ONDANSETRON HCL 4 MG/2ML IJ SOLN: 4.0000 mg | Freq: Once | INTRAMUSCULAR | Status: DC | PRN

## 2013-06-21 MED ORDER — LIDOCAINE HCL (PF) 1 % IJ SOLN
INTRAMUSCULAR | Status: AC
  Filled 2013-06-21: qty 5

## 2013-06-21 MED ORDER — MIDAZOLAM HCL 2 MG/2ML IJ SOLN
1.0000 mg | INTRAMUSCULAR | Status: DC | PRN
  Administered 2013-06-21: 2 mg via INTRAVENOUS

## 2013-06-21 MED ORDER — LACTATED RINGERS IV SOLN
INTRAVENOUS | Status: DC
  Administered 2013-06-21: 11:00:00 via INTRAVENOUS

## 2013-06-21 MED ORDER — FENTANYL CITRATE 0.05 MG/ML IJ SOLN
INTRAMUSCULAR | Status: DC | PRN
  Administered 2013-06-21 (×4): 25 ug via INTRAVENOUS

## 2013-06-21 MED ORDER — LIDOCAINE HCL 1 % IJ SOLN
INTRAMUSCULAR | Status: DC | PRN
  Administered 2013-06-21: 50 mg via INTRADERMAL

## 2013-06-21 MED ORDER — CEFAZOLIN SODIUM-DEXTROSE 2-3 GM-% IV SOLR
2.0000 g | INTRAVENOUS | Status: AC
  Administered 2013-06-21: 2 g via INTRAVENOUS
  Filled 2013-06-21: qty 50

## 2013-06-21 MED ORDER — GLYCOPYRROLATE 0.2 MG/ML IJ SOLN
0.2000 mg | Freq: Once | INTRAMUSCULAR | Status: AC
  Administered 2013-06-21: 0.2 mg via INTRAVENOUS
  Filled 2013-06-21: qty 1

## 2013-06-21 MED ORDER — BUPIVACAINE HCL (PF) 0.5 % IJ SOLN
INTRAMUSCULAR | Status: AC
  Filled 2013-06-21: qty 30

## 2013-06-21 MED ORDER — BUPIVACAINE HCL (PF) 0.5 % IJ SOLN
INTRAMUSCULAR | Status: DC | PRN
  Administered 2013-06-21: 10 mL

## 2013-06-21 MED ORDER — ONDANSETRON HCL 4 MG/2ML IJ SOLN
4.0000 mg | Freq: Once | INTRAMUSCULAR | Status: AC
  Administered 2013-06-21: 4 mg via INTRAVENOUS

## 2013-06-21 MED ORDER — FENTANYL CITRATE 0.05 MG/ML IJ SOLN
25.0000 ug | INTRAMUSCULAR | Status: DC | PRN
  Administered 2013-06-21: 50 ug via INTRAVENOUS
  Filled 2013-06-21: qty 2

## 2013-06-21 SURGICAL SUPPLY — 32 items
BAG HAMPER (MISCELLANEOUS) ×3 IMPLANT
CLOSURE WOUND 1/4 X3 (GAUZE/BANDAGES/DRESSINGS)
CLOTH BEACON ORANGE TIMEOUT ST (SAFETY) ×3 IMPLANT
COVER LIGHT HANDLE STERIS (MISCELLANEOUS) ×6 IMPLANT
DECANTER SPIKE VIAL GLASS SM (MISCELLANEOUS) ×3 IMPLANT
DERMABOND ADVANCED (GAUZE/BANDAGES/DRESSINGS) ×2
DERMABOND ADVANCED .7 DNX12 (GAUZE/BANDAGES/DRESSINGS) ×1 IMPLANT
DURAPREP 26ML APPLICATOR (WOUND CARE) ×3 IMPLANT
ELECT REM PT RETURN 9FT ADLT (ELECTROSURGICAL) ×3
ELECTRODE REM PT RTRN 9FT ADLT (ELECTROSURGICAL) ×1 IMPLANT
GLOVE BIO SURGEON STRL SZ7.5 (GLOVE) ×3 IMPLANT
GLOVE BIOGEL PI IND STRL 7.5 (GLOVE) ×1 IMPLANT
GLOVE BIOGEL PI INDICATOR 7.5 (GLOVE) ×2
GLOVE ECLIPSE 7.0 STRL STRAW (GLOVE) ×3 IMPLANT
GLOVE EXAM NITRILE MD LF STRL (GLOVE) ×3 IMPLANT
GOWN STRL REUS W/TWL LRG LVL3 (GOWN DISPOSABLE) ×6 IMPLANT
KIT ROOM TURNOVER APOR (KITS) ×3 IMPLANT
MANIFOLD NEPTUNE II (INSTRUMENTS) ×3 IMPLANT
NEEDLE HYPO 25X1 1.5 SAFETY (NEEDLE) IMPLANT
NS IRRIG 1000ML POUR BTL (IV SOLUTION) ×3 IMPLANT
PACK MINOR (CUSTOM PROCEDURE TRAY) ×3 IMPLANT
PAD ARMBOARD 7.5X6 YLW CONV (MISCELLANEOUS) ×3 IMPLANT
SET BASIN LINEN APH (SET/KITS/TRAYS/PACK) ×3 IMPLANT
SPONGE GAUZE 2X2 8PLY STER LF (GAUZE/BANDAGES/DRESSINGS)
SPONGE GAUZE 2X2 8PLY STRL LF (GAUZE/BANDAGES/DRESSINGS) IMPLANT
SPONGE LAP 18X18 X RAY DECT (DISPOSABLE) ×3 IMPLANT
STRIP CLOSURE SKIN 1/4X3 (GAUZE/BANDAGES/DRESSINGS) IMPLANT
SUT SILK 2 0 SH (SUTURE) ×3 IMPLANT
SUT VIC AB 3-0 SH 27 (SUTURE) ×2
SUT VIC AB 3-0 SH 27X BRD (SUTURE) ×1 IMPLANT
SUT VIC AB 4-0 PS2 27 (SUTURE) ×3 IMPLANT
SYR CONTROL 10ML LL (SYRINGE) ×3 IMPLANT

## 2013-06-21 NOTE — Interval H&P Note (Signed)
History and Physical Interval Note:  06/21/2013 11:05 AM  Shannon Romero  has presented today for surgery, with the diagnosis of left breast cancer  The various methods of treatment have been discussed with the patient and family. After consideration of risks, benefits and other options for treatment, the patient has consented to  Procedure(s): RE-EXCISION OF BREAST CANCER,SUPERIOR MARGINS (Left) as a surgical intervention .  The patient's history has been reviewed, patient examined, no change in status, stable for surgery.  I have reviewed the patient's chart and labs.  Questions were answered to the patient's satisfaction.     Aviva Signs A

## 2013-06-21 NOTE — Discharge Instructions (Signed)
Follow up appt with Dr. Arnoldo Morale on 06/27/13 at 10:15AM.   Segmental Mastectomy and Axillary Lymph Node Dissection Care After These discharge instructions provide you with general information on caring for yourself after you leave the hospital. While your treatment has been planned according to the most current medical practices available, unavoidable complications occasionally occur. It is important that you know the warning signs of complications so that you can seek treatment. Please read the instructions outlined below and refer to this sheet in the next few weeks. Your doctor may also give you specific information. If you have any complications or questions after discharge, please call your doctor.  ACTIVITY  Your doctor will tell you when you may resume strenuous activities, driving and sports. After the drain(s) are removed, you may do light housework, but avoid heavy lifting, carrying or pushing (you should not be lifting anything heavier than 5 lbs.).  Take frequent rest periods. You may tire more easily than usual. Always rest and elevate the arm affected by your surgery for a period of time equal to your activity time.  Continue doing the exercises given to you by the physical therapist/occupational therapist even after full range of motion has returned. The amount of time this takes will vary from person to person. Generally, after normal range of motion has returned, some stiffness and soreness may persist for 2-3 months. This is normal and should subside.  Begin sports or strenuous activities in moderation, giving you a chance to rebuild your endurance.  Continue to be cautious of heavy lifting or carrying (not more than 10 lbs.) with the affected arm. You may return to work as recommended by your doctor. NUTRITION  You may resume your normal diet.  Make sure you drink plenty of fluids (6-8 glasses per day).  Eat a well-balanced diet.  Daily portions of food from the grains,  vegetables, fruits, milk, and meat and beans families are necessary for your health. You may visit [employer] for more dietary information. HYGIENE  You may wash your hair.  If your incision (the cut from surgery) is healed, you may shower or take a bath, unless instructed otherwise by your caregiver. FEVER If you feel feverish or have shaking chills, take your temperature. If your temperature is 102 F (38.9 C) or above, call your caregiver. The fever may mean there is an infection. If you call early, infection can be treated with antibiotics and hospitalization may be avoided. PAIN CONTROL  Mild discomfort may occur. You may need to take an "over-the-counter" pain medication or a medication prescribed by your doctor.  Call your doctor if you experience increased pain. INCISION CARE Check your incision daily for increased redness, drainage, swelling or separation of skin edges. Call your doctor if any of the above are noted. DRAIN CARE If you have a drain in place, ask for instructions for "Surgical Drain Care". ARM AND HAND CARE  Since the lymph nodes under your arm have been removed, there may be a greater chance for the arm to swell.  Avoid, if possible, having blood pressures taken, blood drawn or injections given in the affected arm.  Use hand lotion to soften fingernail cuticles instead of cutting them to avoid cutting yourself.  Use an electric shaver to shave your underarms.  You may use a deodorant after the incision has completely healed.  Be careful not to cut yourself when cooking, sewing or gardening.  Do not weigh your arm straight down with a package or your purse.  Note: Follow the exercises and instructions given to you by the physical therapist/occupational therapist and your treating doctor.  Document Released: 11/12/2003 Document Revised: 06/22/2011 Document Reviewed: 06/21/2007 [facility] Patient Information 2014 [place], [place].

## 2013-06-21 NOTE — Transfer of Care (Signed)
Immediate Anesthesia Transfer of Care Note  Patient: Shannon Romero  Procedure(s) Performed: Procedure(s): RE-EXCISION OF BREAST CANCER (Left)  Patient Location: PACU  Anesthesia Type:General  Level of Consciousness: awake and patient cooperative  Airway & Oxygen Therapy: Patient Spontanous Breathing and Patient connected to face mask oxygen  Post-op Assessment: Report given to PACU RN, Post -op Vital signs reviewed and stable and Patient moving all extremities  Post vital signs: Reviewed and stable  Complications: No apparent anesthesia complications

## 2013-06-21 NOTE — Anesthesia Procedure Notes (Signed)
Procedure Name: LMA Insertion Date/Time: 06/21/2013 11:59 AM Performed by: Charmaine Downs Pre-anesthesia Checklist: Patient being monitored, Suction available, Emergency Drugs available and Patient identified Patient Re-evaluated:Patient Re-evaluated prior to inductionOxygen Delivery Method: Circle system utilized Preoxygenation: Pre-oxygenation with 100% oxygen Intubation Type: IV induction Ventilation: Mask ventilation without difficulty LMA: LMA inserted LMA Size: 3.0 Tube type: Oral Number of attempts: 1 Placement Confirmation: positive ETCO2 and breath sounds checked- equal and bilateral Tube secured with: Tape Dental Injury: Teeth and Oropharynx as per pre-operative assessment

## 2013-06-21 NOTE — Anesthesia Postprocedure Evaluation (Signed)
  Anesthesia Post-op Note  Patient: Shannon Romero  Procedure(s) Performed: Procedure(s): RE-EXCISION OF BREAST CANCER (Left)  Patient Location: PACU  Anesthesia Type:General  Level of Consciousness: awake, alert , oriented and patient cooperative  Airway and Oxygen Therapy: Patient Spontanous Breathing  Post-op Pain: 2 /10, mild  Post-op Assessment: Post-op Vital signs reviewed, Patient's Cardiovascular Status Stable, Respiratory Function Stable, Patent Airway and Pain level controlled  Post-op Vital Signs: Reviewed and stable  Complications: No apparent anesthesia complications

## 2013-06-21 NOTE — Op Note (Signed)
Patient:  Shannon Romero  DOB:  27-Mar-1946  MRN:  [id number]   Preop Diagnosis:  Left breast carcinoma, unclear margin  Postop Diagnosis:  Same  Procedure:  Reexcision of left breast tissue  Surgeon:  Aviva Signs, M.D.  Anes:  General  Indications:  Patient is a 68 year old white female status post left partial mastectomy after needle localization, sentinel lymph node biopsy for left breast carcinoma who was found to have tumor along the inferior and lateral margin of the specimen removed. I did review this with the patient and she is coming back for reexcision of the inferior and lateral margin. The risks and benefits of the procedure were fully explained to the patient, who gave informed consent.  Procedure note:  The patient is placed the supine position. After general anesthesia was administered, the left breast was prepped and draped using usual sterile technique with DuraPrep. Surgical site confirmation was performed.  An incision was made to the previous lower outer quadrant of the left breast. A seroma was found and this was evacuated without difficulty. The inferior and lateral margin of the biopsy cavity was excised down to the chest wall. A short suture was placed anteriorly and a long suture placed laterally for orientation purposes. Specimen sent to pathology further examination. A bleeding was controlled using Bovie electrocautery. 0.5% Sensorcaine was instilled the surrounding wound. The subcutaneous layer was reapproximated using 3-0 Vicryl interrupted suture. The skin was closed using a 4 Vicryl subcuticular suture. Dermabond was then applied.  All tape and needle counts were correct at the end of the procedure. Patient was awakened and transferred to PACU in stable condition.  Complications:  None  EBL:  Minimal  Specimen:  Reexcision of left breast tissue, short suture anterior, long suture lateral

## 2013-06-21 NOTE — Anesthesia Preprocedure Evaluation (Signed)
Anesthesia Evaluation  Patient identified by MRN, date of birth, ID band Patient awake    Reviewed: Allergy & Precautions, H&P , NPO status , Patient's Chart, lab work & pertinent test results  History of Anesthesia Complications (+) PONV  Airway Mallampati: II TM Distance: >3 FB Neck ROM: Full    Dental  (+) Edentulous Upper, Edentulous Lower   Pulmonary former smoker,  breath sounds clear to auscultation        Cardiovascular hypertension, Pt. on medications Rhythm:Regular Rate:Normal     Neuro/Psych Depression    GI/Hepatic GERD-  Medicated and Controlled,  Endo/Other  diabetes, Type 2, Oral Hypoglycemic Agents  Renal/GU      Musculoskeletal   Abdominal   Peds  Hematology   Anesthesia Other Findings   Reproductive/Obstetrics                           Anesthesia Physical Anesthesia Plan  ASA: III  Anesthesia Plan: General   Post-op Pain Management:    Induction: Intravenous  Airway Management Planned: LMA  Additional Equipment:   Intra-op Plan:   Post-operative Plan: Extubation in OR  Informed Consent: I have reviewed the patients History and Physical, chart, labs and discussed the procedure including the risks, benefits and alternatives for the proposed anesthesia with the patient or authorized representative who has indicated his/her understanding and acceptance.     Plan Discussed with:   Anesthesia Plan Comments:         Anesthesia Quick Evaluation

## 2013-06-23 ENCOUNTER — Encounter (HOSPITAL_COMMUNITY): Payer: Self-pay | Admitting: General Surgery

## 2013-06-27 ENCOUNTER — Ambulatory Visit: Payer: Medicare HMO | Admitting: Family Medicine

## 2013-06-28 ENCOUNTER — Ambulatory Visit: Payer: Medicare HMO | Admitting: Family Medicine

## 2013-06-28 ENCOUNTER — Ambulatory Visit (INDEPENDENT_AMBULATORY_CARE_PROVIDER_SITE_OTHER): Payer: Medicare HMO | Admitting: Family Medicine

## 2013-06-28 ENCOUNTER — Encounter: Payer: Self-pay | Admitting: Family Medicine

## 2013-06-28 VITALS — BP 122/74 | HR 82 | Resp 16 | Wt 206.4 lb

## 2013-06-28 DIAGNOSIS — Z79899 Other long term (current) drug therapy: Secondary | ICD-10-CM

## 2013-06-28 DIAGNOSIS — Z7189 Other specified counseling: Secondary | ICD-10-CM

## 2013-06-28 DIAGNOSIS — E669 Obesity, unspecified: Secondary | ICD-10-CM

## 2013-06-28 DIAGNOSIS — M549 Dorsalgia, unspecified: Secondary | ICD-10-CM

## 2013-06-28 DIAGNOSIS — I10 Essential (primary) hypertension: Secondary | ICD-10-CM

## 2013-06-28 DIAGNOSIS — C50919 Malignant neoplasm of unspecified site of unspecified female breast: Secondary | ICD-10-CM

## 2013-06-28 DIAGNOSIS — E119 Type 2 diabetes mellitus without complications: Secondary | ICD-10-CM

## 2013-06-28 LAB — HEMOGLOBIN A1C
HEMOGLOBIN A1C: 6.4 % — AB (ref <5.7)
Mean Plasma Glucose: 137 mg/dL — ABNORMAL HIGH (ref <117)

## 2013-06-28 NOTE — Patient Instructions (Signed)
Annual physical exam in 4 month, call if you need me before  All the best with your ongoing healthcare needs   HBA1C today  Fasting lipid, cmp and EGFR and hBA1C in 4 month, before visit  Fall Prevention and [place] cause injuries and can affect all age groups. It is possible to prevent falls.  HOW TO PREVENT FALLS  Wear shoes with rubber soles that do not have an opening for your toes.  Keep the inside and outside of your house well lit.  Use night lights throughout your home.  Remove clutter from floors.  Clean up floor spills.  Remove throw rugs or fasten them to the floor with carpet tape.  Do not place electrical cords across pathways.  Put grab bars by your tub, shower, and toilet. Do not use towel bars as grab bars.  Put handrails on both sides of the stairway. Fix loose handrails.  Do not climb on stools or stepladders, if possible.  Do not wax your floors.  Repair uneven or unsafe sidewalks, walkways, or stairs.  Keep items you use a lot within reach.  Be aware of pets.  Keep emergency numbers next to the telephone.  Put smoke detectors in your home and near bedrooms. Ask your doctor what other things you can do to prevent falls. Document Released: 01/24/2009 Document Revised: 09/29/2011 Document Reviewed: 06/30/2011 [facility] Patient Information 2014 [place], [place].

## 2013-06-28 NOTE — Progress Notes (Signed)
Subjective:    Patient ID: Shannon Romero, female    DOB: May 09, 1945, 68 y.o.   MRN: [id number]  HPI The PT is here for follow up and re-evaluation of chronic medical conditions, medication management and review of any available recent lab and radiology data.  Preventive health is updated, specifically  Cancer screening and Immunization. Diagnosed with breast cancer in January , will see oncology this month, has had  left partial mastectomy. Colonoscopy revealed benign polyp. Good spirits , thankful re early cancer detection, appears as though all tumor has been excised from record review  The PT denies any adverse reactions to current medications since the last visit.  There are no new concerns.  Has back pain and geenralized arthritic pain, but not excessive and prefers to hold on medication pending oncology eval, unsure of what if any treatment lies ahead for her Denies uncontrolled blood sugars, fasting sugars are seldom over 130      Review of Systems See HPI Denies recent fever or chills. Denies sinus pressure, nasal congestion, ear pain or sore throat. Denies chest congestion, productive cough or wheezing. Denies chest pains, palpitations and leg swelling Denies abdominal pain, nausea, vomiting,diarrhea or constipation.   Denies dysuria, frequency, hesitancy or incontinence. Denies headaches, seizures, numbness, or tingling. Denies depression, anxiety or insomnia. Denies skin break down or rash.        Objective:   Physical Exam BP 122/74  Pulse 82  Resp 16  Wt 206 lb 6.4 oz (93.622 kg)  SpO2 97% Patient alert and oriented and in no cardiopulmonary distress.  HEENT: No facial asymmetry, EOMI, no sinus tenderness,  oropharynx pink and moist.  Neck supple no adenopathy.  Chest: Clear to auscultation bilaterally.  CVS: S1, S2 no murmurs, no S3.  ABD: Soft non tender. Bowel sounds normal.  Ext: No edema  MS: Adequate though reduced  ROM spine, shoulders, hips  and knees.  Skin: Intact, left breast surgical site is healing very well  Psych: Good eye contact, normal affect. Memory intact not anxious or depressed appearing.  CNS: CN 2-12 intact, power, tone and sensation normal throughout.        Assessment & Plan:  Breast cancer Pt has upcoming initial appt with oncology this month, has had partial left mastectomy, she has very positivew attitude  DIABETES MELLITUS, TYPE II Controlled, no change in medication Patient advised to reduce carb and sweets, commit to regular physical activity, take meds as prescribed, test blood as directed, and attempt to lose weight, to improve blood sugar control.   HYPERTENSION Controlled, no change in medication DASH diet and commitment to daily physical activity for a minimum of 30 minutes discussed and encouraged, as a part of hypertension management. The importance of attaining a healthy weight is also discussed.   Back pain with radiation Increased pai in past week, has hydrocodone form recent breast surgery which she is using occasionally for this. Has benefited in the pst from steroids ,short course, but holding off on this now due to upcoming oncology eval  OBESITY Unchanged. Patient re-educated about  the importance of commitment to a  minimum of 150 minutes of exercise per week. The importance of healthy food choices with portion control discussed. Encouraged to start a food diary, count calories and to consider  joining a support group. Sample diet sheets offered. Goals set by the patient for the next several months.     Encounter for home safety review for injury prevention Reduction in fall  risk and safety in the home discussed , and literature  provided on the discharge instruction sheet as well.

## 2013-06-29 ENCOUNTER — Encounter: Payer: Self-pay | Admitting: Family Medicine

## 2013-06-29 ENCOUNTER — Other Ambulatory Visit: Payer: Self-pay | Admitting: Family Medicine

## 2013-07-02 DIAGNOSIS — Z7189 Other specified counseling: Secondary | ICD-10-CM | POA: Insufficient documentation

## 2013-07-02 DIAGNOSIS — C50919 Malignant neoplasm of unspecified site of unspecified female breast: Secondary | ICD-10-CM | POA: Insufficient documentation

## 2013-07-02 NOTE — Assessment & Plan Note (Signed)
Controlled, no change in medication DASH diet and commitment to daily physical activity for a minimum of 30 minutes discussed and encouraged, as a part of hypertension management. The importance of attaining a healthy weight is also discussed.  

## 2013-07-02 NOTE — Assessment & Plan Note (Signed)
Unchanged. Patient re-educated about  the importance of commitment to a  minimum of 150 minutes of exercise per week. The importance of healthy food choices with portion control discussed. Encouraged to start a food diary, count calories and to consider  joining a support group. Sample diet sheets offered. Goals set by the patient for the next several months.    

## 2013-07-02 NOTE — Assessment & Plan Note (Signed)
Pt has upcoming initial appt with oncology this month, has had partial left mastectomy, she has very positivew attitude

## 2013-07-02 NOTE — Assessment & Plan Note (Signed)
Reduction in fall risk and safety in the home discussed , and literature  provided on the discharge instruction sheet as well.  

## 2013-07-02 NOTE — Assessment & Plan Note (Signed)
Controlled, no change in medication Patient advised to reduce carb and sweets, commit to regular physical activity, take meds as prescribed, test blood as directed, and attempt to lose weight, to improve blood sugar control.  

## 2013-07-02 NOTE — Assessment & Plan Note (Signed)
Increased pai in past week, has hydrocodone form recent breast surgery which she is using occasionally for this. Has benefited in the pst from steroids ,short course, but holding off on this now due to upcoming oncology eval

## 2013-07-05 ENCOUNTER — Encounter (HOSPITAL_COMMUNITY): Payer: Medicare HMO | Attending: Hematology and Oncology

## 2013-07-05 ENCOUNTER — Encounter (HOSPITAL_COMMUNITY): Payer: Self-pay

## 2013-07-05 VITALS — BP 130/82 | HR 77 | Temp 98.4°F | Resp 18 | Wt 204.1 lb

## 2013-07-05 DIAGNOSIS — E119 Type 2 diabetes mellitus without complications: Secondary | ICD-10-CM | POA: Insufficient documentation

## 2013-07-05 DIAGNOSIS — C50919 Malignant neoplasm of unspecified site of unspecified female breast: Secondary | ICD-10-CM | POA: Insufficient documentation

## 2013-07-05 DIAGNOSIS — Z9010 Acquired absence of unspecified breast and nipple: Secondary | ICD-10-CM | POA: Insufficient documentation

## 2013-07-05 DIAGNOSIS — K219 Gastro-esophageal reflux disease without esophagitis: Secondary | ICD-10-CM | POA: Insufficient documentation

## 2013-07-05 DIAGNOSIS — I10 Essential (primary) hypertension: Secondary | ICD-10-CM | POA: Insufficient documentation

## 2013-07-05 DIAGNOSIS — Z170 Estrogen receptor positive status [ER+]: Secondary | ICD-10-CM | POA: Insufficient documentation

## 2013-07-05 LAB — COMPREHENSIVE METABOLIC PANEL
ALK PHOS: 85 U/L (ref 39–117)
ALT: 17 U/L (ref 0–35)
AST: 24 U/L (ref 0–37)
Albumin: 3.9 g/dL (ref 3.5–5.2)
BILIRUBIN TOTAL: 0.3 mg/dL (ref 0.3–1.2)
BUN: 18 mg/dL (ref 6–23)
CHLORIDE: 102 meq/L (ref 96–112)
CO2: 24 meq/L (ref 19–32)
Calcium: 10.6 mg/dL — ABNORMAL HIGH (ref 8.4–10.5)
Creatinine, Ser: 0.9 mg/dL (ref 0.50–1.10)
GFR calc Af Amer: 76 mL/min — ABNORMAL LOW (ref >90)
GFR, EST NON AFRICAN AMERICAN: 65 mL/min — AB (ref >90)
GLUCOSE: 129 mg/dL — AB (ref 70–99)
Potassium: 4.1 mEq/L (ref 3.7–5.3)
SODIUM: 141 meq/L (ref 137–147)
Total Protein: 8.4 g/dL — ABNORMAL HIGH (ref 6.0–8.3)

## 2013-07-05 LAB — CBC WITH DIFFERENTIAL/PLATELET
Basophils Absolute: 0 10*3/uL (ref 0.0–0.1)
Basophils Relative: 0 % (ref 0–1)
Eosinophils Absolute: 0.1 10*3/uL (ref 0.0–0.7)
Eosinophils Relative: 2 % (ref 0–5)
HEMATOCRIT: 38.4 % (ref 36.0–46.0)
Hemoglobin: 12.3 g/dL (ref 12.0–15.0)
LYMPHS ABS: 3.8 10*3/uL (ref 0.7–4.0)
LYMPHS PCT: 46 % (ref 12–46)
MCH: 26.7 pg (ref 26.0–34.0)
MCHC: 32 g/dL (ref 30.0–36.0)
MCV: 83.3 fL (ref 78.0–100.0)
Monocytes Absolute: 0.4 10*3/uL (ref 0.1–1.0)
Monocytes Relative: 5 % (ref 3–12)
NEUTROS ABS: 3.8 10*3/uL (ref 1.7–7.7)
Neutrophils Relative %: 47 % (ref 43–77)
Platelets: 278 10*3/uL (ref 150–400)
RBC: 4.61 MIL/uL (ref 3.87–5.11)
RDW: 16.1 % — AB (ref 11.5–15.5)
WBC: 8.2 10*3/uL (ref 4.0–10.5)

## 2013-07-05 NOTE — Progress Notes (Signed)
Kremlin A. Barnet [place], M.D.  NEW PATIENT EVALUATION   Name: Shannon Romero Date: 07/05/2013 MRN: [id number] DOB: 03-20-1946  PCP: [name], MD   REFERRING PHYSICIAN: Fayrene Helper, MD  REASON FOR REFERRAL: Newly diagnosed left breast cancer 04/27/2013.     HISTORY OF PRESENT ILLNESS:Shannon Romero is a 68 y.o. female who is referred by her family physician and surgeon for evaluation and management of newly diagnosed left breast cancer. Mammographic examination revealed an abnormality in the left breast for which breast biopsy was recommended.   PAST MEDICAL HISTORY:  has a past medical history of Hypertension; Diabetes mellitus; GERD (gastroesophageal reflux disease); Bulging disc; and Breast cancer.     PAST SURGICAL HISTORY: Past Surgical History  Procedure Laterality Date  . Tubal ligation    . Ovarian cyst removal    . Colonoscopy  03/26/2008    LKG:MWNUUV rectum, scattered pancolonic diverticula and colonic/mucosa appeared normal, normal terminal ileum.  . Colonoscopy  02/28/2003    NUR: A few scattered small diverticula throughout the colon/ No evidence of colonic polyps or neoplasm/ Polyps or other lesions which potentially cause GI blood loss  . Colonoscopy N/A 04/21/2013    Procedure: COLONOSCOPY;  Surgeon: Daneil Dolin, MD;  Location: [facility];  Service: Endoscopy;  Laterality: N/A;  10:00  . Partial mastectomy with needle localization and axillary sentinel lymph node bx Left 06/07/2013    Procedure: PARTIAL MASTECTOMY WITH NEEDLE LOCALIZATION AND AXILLARY SENTINEL LYMPH NODE BX;  Surgeon: Jamesetta So, MD;  Location: [facility];  Service: General;  Laterality: Left;  . Re-excision of breast cancer,superior margins Left 06/21/2013    Procedure: RE-EXCISION OF BREAST CANCER;  Surgeon: Jamesetta So, MD;  Location: [facility];  Service: General;  Laterality: Left;     CURRENT MEDICATIONS: has a current  medication list which includes the following prescription(s): aspirin, calcium + d3, fish oil-omega-3 fatty acids, hydrocodone-acetaminophen, lisinopril-hydrochlorothiazide, meloxicam, metformin, multivitamin, pyridoxine, and UNABLE TO FIND, and the following Facility-Administered Medications: influenza (>/= 3 years) inactive virus vaccine.   ALLERGIES: Tramadol   SOCIAL HISTORY:  reports that she has quit smoking. She does not have any smokeless tobacco history on file. She reports that she does not drink alcohol or use illicit drugs.   FAMILY HISTORY: family history includes Cancer in her brother; Colon cancer in her father; Diabetes in her brother, mother, and sister; Heart disease in her mother and sister; Hypertension in her sister.    REVIEW OF SYSTEMS:  Other than that discussed above is noncontributory.    PHYSICAL EXAM:  weight is 204 lb 1.6 oz (92.579 kg). Her oral temperature is 98.4 F (36.9 C). Her blood pressure is 130/82 and her pulse is 77. Her respiration is 18 and oxygen saturation is 96%.    GENERAL:alert, no distress and comfortable. Moderately obese. SKIN: skin color, texture, turgor are normal, no rashes or significant lesions EYES: normal, Conjunctiva are pink and non-injected, sclera clear OROPHARYNX:no exudate, no erythema and lips, buccal mucosa, and tongue normal  NECK: supple, thyroid normal size, non-tender, without nodularity CHEST: Status post left breast lumpectomy with scab formation over incision site with slight tenderness but no redness. LYMPH:  no palpable lymphadenopathy in the cervical, axillary or inguinal LUNGS: clear to auscultation and percussion with normal breathing effort HEART: regular rate & rhythm and no murmurs ABDOMEN:abdomen soft, non-tender and normal bowel sounds MUSCULOSKELETALl:no cyanosis of digits,  no clubbing or edema .Marland Kitchen No evidence of lymphedema. NEURO: alert & oriented x 3 with fluent speech, no focal motor/sensory  deficits    LABORATORY DATA:    Office Visit on 06/28/2013  Component Date Value Ref Range Status  . Hemoglobin A1C 06/28/2013 6.4* <5.7 % Final   Comment:                                                                                                 According to the ADA Clinical Practice Recommendations for 2011, when                          HbA1c is used as a screening test:                                                       >=6.5%   Diagnostic of Diabetes Mellitus                                     (if abnormal result is confirmed)                                                     5.7-6.4%   Increased risk of developing Diabetes Mellitus                                                     References:Diagnosis and Classification of Diabetes Mellitus,Diabetes                          IPJA,2505,39(JQBHA 1):S62-S69 and Standards of Medical Care in                                  Diabetes - 2011,Diabetes LPFX,9024,09 (Suppl 1):S11-S61.                             . Mean Plasma Glucose 06/28/2013 137* <117 mg/dL Final  Admission on 06/21/2013, Discharged on 06/21/2013  Component Date Value Ref Range Status  . Glucose-Capillary 06/21/2013 119* 70 - 99 mg/dL Final  Hospital Outpatient Visit on 06/07/2013  Component Date Value Ref Range Status  . Glucose-Capillary 06/07/2013 102* 70 - 99 mg/dL Final    Urinalysis    Component Value Date/Time   BILIRUBINUR negative 09/27/2012 1331   UROBILINOGEN 0.2 09/27/2012 1331   NITRITE negative 09/27/2012 1331   LEUKOCYTESUR  Negative 09/27/2012 1331      @RADIOGRAPHY :    MM Digital Diagnostic Unilat L Status: Edited Result - FINAL            Addendum    Herma Mering Sr., MD Wed Apr 19, 2013 1:53:56 PM EST       ADDENDUM REPORT: 04/19/2013 13:51  ADDENDUM:  The patient has been scheduled for left breast stereotactic core  biopsy at the [facility] on 04/26/2013 at 10 a.m.  Electronically Signed  By: Luberta Robertson M.D.  On: 04/19/2013 13:51       Study Result    CLINICAL DATA: Recall from screening mammogram.  EXAM:  DIGITAL DIAGNOSTIC left breast MAMMOGRAM  ULTRASOUND left BREAST  COMPARISON: Previous examinations.  ACR Breast Density Category a: The breast tissue is almost entirely  fatty.  FINDINGS:  Additional views of the left breast demonstrated ill-defined mass  located within the upper outer quadrant of the left breast which by  mammography measures 9 mm in size. This is a suspicious mass and  tissue sampling is recommended.  On physical exam, there is no discrete palpable abnormality within  the lateral left breast or left upper outer quadrant.  Ultrasound is performed, showing no ultrasound correlate for the  mammographic finding.  Ultrasound left axilla demonstrates no adenopathy.  Tissue sampling of the mass is recommended. I have discussed  stereotactic core biopsy of the mass with the patient. This will be  scheduled at our [facility] per patient preference.  IMPRESSION:  1. Ill-defined mass measuring 9 mm in size located within the upper  outer quadrant of the left breast. Tissue sampling is recommended  and stereotactic core biopsy will be scheduled.  RECOMMENDATION:  Left breast stereotactic core biopsy.  I have discussed the findings and recommendations with the patient.  Results were also provided in writing at the conclusion of the  visit. If applicable, a reminder letter will be sent to the patient  regarding the next appointment.  BI-RADS CATEGORY 4: Suspicious abnormality - biopsy should be  considered.      Nm Sentinel Node Inj-no Rpt (breast)  06/07/2013   CLINICAL DATA: Left breast cancer   Sulfur colloid was injected intradermally by the nuclear medicine  technologist for breast cancer sentinel node localization.    Mm Lt Plc Breast Loc Dev   1st Lesion  Inc Mammo Guide  06/07/2013   CLINICAL DATA:  Patient with new diagnosis of left breast  cancer.  EXAM: NEEDLE LOCALIZATION OF THE LEFT BREAST WITH MAMMO GUIDANCE  COMPARISON:  Previous exams.  FINDINGS: Patient presents for needle localization prior to lumpectomy. I met with the patient and we discussed the procedure of needle localization including benefits and alternatives. We discussed the high likelihood of a successful procedure. We discussed the risks of the procedure, including infection, bleeding, tissue injury, and further surgery. Informed, written consent was given. The usual time-out protocol was performed immediately prior to the procedure.  Using mammographic guidance, sterile technique, 2% lidocaine and a 9 cm modified Kopans needle, biopsy site marker was localized using a lateral to medial approach. The films were marked for Dr. Arnoldo Morale.  Specimen radiograph was performed and confirms the clip and hookwire to be present in the tissue sample. The specimen was marked for pathology.  IMPRESSION: Needle localization of the left breast. No apparent complications.   Electronically Signed   By: Donavan Burnet M.D.   On: 06/07/2013 12:42    PATHOLOGY:  FINAL for Lothrop, Shawnee ([id number]) Patient: Erhard, Noni L Collected: 04/27/2013 Client: [facility] Accession: [id number] Received: 04/27/2013 Cleatis Polka, MD DOB: 17-Sep-1945 Age: 17 Gender: F Reported: 04/28/2013 [place] Patient Ph: [phone] MRN #: [id number] [place], [place] [address] Client Acc#: Chart #: Phone: [phone] Fax: CC: [name], MD REPORT OF SURGICAL PATHOLOGY ADDITIONAL INFORMATION: A sample was sent to [facility] for Her 2 analysis by [facility]. The results are as follows: Negative Average Her 2 signals per nucleus: 3.0 Average CEN 17 signals per nucleus: 2.0 Her 2/CEN 17 signal ratio: 1.5 Enid Cutter MD Pathologist, Electronic Signature ( Signed 05/05/2013) PROGNOSTIC INDICATORS - ACIS Results: IMMUNOHISTOCHEMICAL AND MORPHOMETRIC ANALYSIS BY THE AUTOMATED  CELLULAR IMAGING SYSTEM (ACIS) Estrogen Receptor: 99%, POSITIVE, STRONG STAINING INTENSITY Progesterone Receptor: 88%, POSITIVE, STRONG STAINING INTENSITY Proliferation Marker Ki67: 60% REFERENCE RANGE ESTROGEN RECEPTOR NEGATIVE <1% POSITIVE =>1% PROGESTERONE RECEPTOR NEGATIVE <1% POSITIVE =>1% All controls stained appropriately Enid Cutter MD 1 of 3 FINAL for Lazar, Ayako L ([id number]) ADDITIONAL INFORMATION:(continued) Pathologist, Electronic Signature ( Signed 05/03/2013) Both the in situ and invasive carcinoma demonstrate strong diffuse expression of E-Cadherin; supporting ductal origin. (CRR:gt,04/1913) Mali RUND DO Pathologist, Electronic Signature ( Signed 05/01/2013) FINAL DIAGNOSIS Diagnosis Breast, left, needle core biopsy, posterior lateral - INVASIVE MAMMARY CARCINOMA, SEE COMMENT. - MAMMARY CARCINOMA IN SITU. Microscopic Comment E-cadherin stain is pending and will be reported in an addendum. Although the grade of tumor is best assessed at resection, with these biopsies, both the invasive and in situ carcinoma are grade I-II. Breast prognostic studies are pending and will be reported in an addendum. The case was reviewed by Dr. Avis Epley who concurs. (CRR:gt, 04/28/13) Mali RUND DO Pathologist, Electronic Signature (Case signed 04/28/2013) Specimen Gross and Clinical Information Specimen Comment Ill defined left breast asymmetry. TIF - 11:00, CIT < 5 minutes Specimen(s) Obtained: Breast, left, needle core biopsy, posterior lateral Gross The specimen is received in formalin and consists of multiple core and core fragments of tan-yellow, focally hemorrhagic fibroadipose tissue, ranging from 0.2 x 0.1 x 0.1 cm to 6.1 x 0.4 x 0.2 cm. The specimen is entirely submitted in four cassettes. Cold ischemic time: Less than five minutes. (KL:ecj 04/27/2013) Stain(s) used in Diagnosis: The following stain(s) were used in diagnosing the case: PR-ACIS, CISH, KI-67-ACIS,  ER-ACIS, E-CAD. The control(s) stained appropriately. Disclaimer Estrogen receptor (SP1), immunohistochemical stains are performed on formalin fixed, paraffin embedded tissue using a 3,3"-diaminobenzidine (DAB) chromogen and [employer] System. The staining intensity of the nucleus is scored morphometrically using the Automated Cellular Imaging System (ACIS) and is reported as the percentage of 2 of 3 FINAL for Giancola, Fallan L ([id number]) Disclaimer(continued) tumor cell nuclei demonstrating specific nuclear staining. PR progesterone receptor (PgR 636), immunohistochemical stains are performed on formalin fixed, paraffin embedded tissue using a 3,3"-diaminobenzidine (DAB) chromogen and [employer] System. The staining intensity of the nucleus is scored morphometrically using the Automated Cellular Imaging System (ACIS) and is reported as the percentage of tumor cell nuclei demonstrating specific nuclear staining. Some of these immunohistochemical stains may have been developed and the performance characteristics determined by [facility]. Some may not have been cleared or approved by the U.S. Food and Drug Administration. The FDA has determined that such clearance or approval is not necessary. This test is used for clinical purposes. It should not be regarded as investigational or for research. This laboratory is certified under the [place] (CLIA-88) as qualified to perform high  complexity clinical laboratory testing. Ki-67 (Mib-1), immunohistochemical stains are performed on formalin fixed, paraffin embedded tissue using a 3,3"-diaminobenzidine (DAB) chromogen and [place]. The staining intensity of the nucleus is scored morphometrically using the Automated Cellular Imaging System (ACIS) and is reported as the percentage of tumor cell nuclei demonstrating specific nuclear staining. Her2Neu by CISH (chromogenic  in-situ hybridization) is performed at [facility]ology, using the [place] pharmDx Kit (code number [id number]) Report signed out from the following location(s) Technical Component performed at [facility]. [address],[place] [address].[id number]., Technical Component performed at [facility], [place], [place] [address]. CLIA #: [id number], Interpretation performed at [facility] [place], [place], [place] [address]. CLIA #: [id number],   FINAL for Ovens, Alden ([id number]) Patient: Dadamo, Nakeita L Collected: 06/07/2013 Client: [facility] Accession: [id number] Received: 06/07/2013 [facility] Signs DOB: 1946/01/09 Age: 79 Gender: F Reported: 06/12/2013 [address] Patient Ph: [phone] MRN #: [id number] Linna Hoff [place] [address] Visit #: [id number] Chart #: Phone: [phone] Fax: CC: REPORT OF SURGICAL PATHOLOGY ADDITIONAL INFORMATION: 4. CHROMOGENIC IN-SITU HYBRIDIZATION Results: HER-2/NEU BY CISH - NO AMPLIFICATION OF HER-2 DETECTED. RESULT RATIO OF HER2: CEP 17 SIGNALS 1.13 AVERAGE HER2 COPY NUMBER PER CELL 1.80 REFERENCE RANGE NEGATIVE HER2/Chr17 Ratio <2.0 and Average HER2 copy number <4.0 EQUIVOCAL HER2/Chr17 Ratio <2.0 and Average HER2 copy number 4.0 and <6.0 POSITIVE HER2/Chr17 Ratio >=2.0 and/or Average HER2 copy number >=6.0 Enid Cutter MD Pathologist, Electronic Signature ( Signed 06/15/2013) FINAL DIAGNOSIS Diagnosis 1. Lymph node, sentinel, biopsy, left breast - ONE OF ONE LYMPH NODES NEGATIVE FOR CARCINOMA (0/1). 2. Lymph node, sentinel, biopsy, left breast - ONE OF ONE LYMPH NODES NEGATIVE FOR CARCINOMA (0/1). 3. Lymph node, sentinel, biopsy, left breast - ONE OF ONE LYMPH NODES NEGATIVE FOR CARCINOMA (0/1). 4. Breast, partial mastectomy, left - INVASIVE DUCTAL CARCINOMA, GRADE 2 OF 3, SPANNING 1.2 CM. - INVASIVE CARCINOMA <0.1 CM OF INFERIOR MARGIN AND  CAUTERIZED TUMOR AT LATERAL 1 of 4 FINAL for Broda, Chrissy L ([id number]) Diagnosis(continued) MARGIN. - DUCTAL CARCINOMA IN SITU, INTERMEDIATE GRADE. - DUCTAL CARCINOMA IN SITU IS <0.2 CM OF LATERAL MARGIN. - SEE ONCOLOGY TABLE. Microscopic Comment 4. BREAST, INVASIVE TUMOR, WITH LYMPH NODE SAMPLING Specimen, including laterality and lymph node sampling (sentinel, non-sentinel): Left breast with sentinel lymph nodes. Procedure: Left partial mastectomy and sentinel lymph node biopsies. Histologic type: Invasive ductal carcinoma. Grade: 3 of 3. Tubule formation: 3 Nuclear pleomorphism: 2 Mitotic: 2 Tumor size (glass slide measurement): 1.2 cm Margins: Positive. Invasive, distance to closest margin: cauterized tumor at lateral, <0.1 cm of inferior In-situ, distance to closest margin: <0.2 cm of lateral If margin positive, focally or broadly: broadly Lymphovascular invasion: Absent. Ductal carcinoma in situ: Present. Grade: Intermediate grade. Extensive intraductal component: No. Lobular neoplasia: Absent. Tumor focality: Unifocal. Treatment effect: N/A. Extent of tumor: Confined to breast parenchyma. Lymph nodes: Examined: 3 Sentinel 0 Non-sentinel 3 Total Lymph nodes with metastasis: 0 Isolated tumor cells (< 0.2 mm): 0 Micrometastasis: (> 0.2 mm and < 2.0 mm): 0 Macrometastasis: (> 2.0 mm): 0 Extracapsular extension: 0 Breast prognostic profile: Performed on [id number], Her2 will be repeated. Estrogen receptor: Positive, strong staining intensity. Progesterone receptor: Positive, strong staining intensity. Her 2 neu: No amplification. Ki-67: 60% Non-neoplastic breast: Calcifications in benign tissue. TNM: pT1c, pN0 Comments: There is a focus of cauterized ducts near the posterior margin which appear slightly atypical, likely due to artifact. Deeper sections were performed and  benign ducts are favored. Vicente Males MD Pathologist, Electronic Signature (Case signed  06/12/2013) Intraoperative Diagnosis 1. LEFT AXILLARY SENTINEL LYMPH NODE, FROZEN SECTION DIAGNOSIS: NEGATIVE FOR 2 of 4 FINAL for Bewick, Mellanie L ([id number]) Intraoperative Diagnosis(continued) METASTATIC CARCINOMA. (RH) 2. LEFT AXILLARY SENTINEL LYMPH NODE, FROZEN SECTION DIAGNOSIS: NEGATIVE FOR METASTATIC CARCINOMA. (RH) 3. LEFT AXILLARY SENTINEL LYMPH NODE, FROZEN SECTION DIAGNOSIS: NEGATIVE FOR METASTATIC CARCINOMA. (RH) Specimen Gross and Clinical Information Specimen(s) Obtained: 1. Lymph node, sentinel, biopsy, left breast 2. Lymph node, sentinel, biopsy, left breast 3. Lymph node, sentinel, biopsy, left breast 4. Breast, partial mastectomy, left Specimen Clinical Information 4. left breast cancer Gross 1. Rapid Intraoperative Consult performed (Yes or No): Yes Specimen: Left axillary sentinel lymph node, received fresh Number and size: One, 2.3 cm Cut Surface(s): Tan yellow. Block Summary: The specimen is entirely submitted for frozen section in one block. 2. Rapid Intraoperative Consult performed (Yes or No): Yes Specimen: Left axillary sentinel lymph node, received fresh Number and size: One, 1.9 cm Cut Surface(s): Tan yellow. Block Summary: The specimen is entirely submitted for frozen section in one block. 3. Rapid Intraoperative Consult performed (Yes or No): Yes Specimen: Left axillary sentinel lymph node, received fresh Number and size: One, 1.5 cm Cut Surface(s): Tan yellow. Block Summary: The specimen is entirely submitted for frozen section in one block. 4. Specimen type: Left breast needle localization lumpectomy, received fresh. The specimen is sectioned and placed in formalin at 2:40 p.m. on 06/07/13. Size: 10.8 cm along the anterior-posterior axis x 7.2 cm along the lateral-medial axis x 1.9 cm along the superior-inferior axis. Orientation: The specimen is oriented with a short suture at the superior margin and long suture at the lateral margin. The  margins are inked with the anterior green, inferior blue, lateral orange, medial yellow, posterior black and superior red. Localized area: There is a localization wire inserted in the specimen Cut surface: The cut surface shows a 2.1 x 1.4 x 0.8 cm indurated white mass which has ill defined borders. There is a silver metallic biopsy clip present in the mass. The surrounding tissue consists of fat and soft white fibrous tissue. Margins: The mass focally extends to the inked margin at the junction of the inferior and lateral margin. Prognostic indicators: Obtained from paraffin blocks if needed. Block summary: Seven blocks submitted A-E = mass to include inferior, lateral and superior margins. F = medial margin G = anterior and posterior margins. (GRP:kh 06-08-13) Disclaimer Her2Neu by CISH (chromogenic in-situ hybridization) is performed at [facility] Pathology, using the [place] pharmDx Kit (code number [phone]) 3 of 4 FINAL for Diclemente, Jennfier L ([id number]) Report signed out from the following location(s) Technical Component performed at [facility]. [address],[place] [address].OQHU:[id number]:[id number]., Interpretation performed at [place], [place], [place] [address]. CLIA #: [id number],   for Lunde, ALEXYIA GUARINO [id number]) Patient: Perillo, Letishia L Collected: 06/21/2013 Client: [facility] Accession: [id number] Received: 06/21/2013 Aviva Signs DOB: 12-Jan-1946 Age: 54 Gender: F Reported: 06/23/2013 [address] Patient Ph: [phone] MRN #: [id number] Linna Hoff [place] [address] Visit #: [id number] Chart #: Phone: [phone] Fax: CC: REPORT OF SURGICAL PATHOLOGY FINAL DIAGNOSIS Diagnosis Breast, excision, left - BENIGN BREAST TISSUE AND ADIPOSE TISSUE WITH PREVIOUS EXCISIONAL SITE CHANGES. - NO EVIDENCE OF ATYPIA OR MALIGNANCY. - NEW MARGIN, NEGATIVE FOR ATYPIA OR MALIGNANCY. Aldona Bar MD Pathologist,  Electronic Signature (Case signed 06/23/2013) Specimen Gross and Clinical Information Specimen(s) Obtained: Breast, excision, left Specimen Clinical  Information left breast cancer Gross In formalin is a 5.2 x 4.2 cm irregular portion of fibrofatty tissue, clinically left breast, which is up to 2 cm thick. There is a short suture at anterior edge and long suture at lateral edge. One aspect has tan-yellow to red, roughened, indurated tissue, and is presumed to be the wall of the previous resection. The opposite surface is presumed to be the new surgical margin and is inked black. The cut surfaces consist predominantly of soft fatty tissue with minimal gray-white, soft fibrous tissue. Representative sections are submitted as follows: A - C = anterior. D, E = posterior. F - H = medial. I - K = lateral. Total, eleven blocks. (SSW:ecj 06/22/2013) Report signed out from the following location(s) Technical Component performed at [place], [place] [address] CLIA: [id number]., Interpretation performed at [place], 1 of 2 FINAL for Cude, Keelee L ([id number]) Report signed out from the following location(s)(continued) [place], [place] [address]. CLIA #: [id number],   IMPRESSION:  #1. 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 #2. Hypertension, controlled. #3. Diabetes mellitus, type II, non-insulin requiring, controlled.   PLAN:  #1. In an effort to determine whether or not chemotherapy would be necessary to control this patient's disease optimally, tissue was sent for Oncotype DX analysis. If recurrence score is high, chemotherapy would be indicated. If recurrence score is low, endocrine treatment alone is adequate. If an intermediate result is  obtained, chemotherapy would be offered depending upon where along the continuum in the intermediate range the patient's result rests. #2. Patient will require radiotherapy to the left breast which will be delivered concurrently with endocrine treatment or after chemotherapy is completed and endocrine therapy is initiated, should chemotherapy be necessary. #3. Followup in 2 weeks. Baseline CEA and CA 27-29 were drawn today.  I appreciate the opportunity of sharing in her care.   Doroteo Bradford, MD 07/05/2013 3:35 PM

## 2013-07-05 NOTE — Progress Notes (Signed)
Oncotype request sent to [facility] @ [phone].

## 2013-07-05 NOTE — Progress Notes (Signed)
Telina L Batley's reason for visit today is for labs as scheduled per MD orders.  Venipuncture performed with a 23 gauge butterfly needle to R Antecubital.  Shannon Romero tolerated procedure well and without incident; questions were answered and patient was discharged.

## 2013-07-05 NOTE — Patient Instructions (Signed)
[place] Discharge Instructions  RECOMMENDATIONS MADE BY THE CONSULTANT AND ANY TEST RESULTS WILL BE SENT TO YOUR REFERRING PHYSICIAN.  We will see you in 2 weeks.  Thank you for choosing [facility] to provide your oncology and hematology care.  To afford each patient quality time with our providers, please arrive at least 15 minutes before your scheduled appointment time.  With your help, our goal is to use those 15 minutes to complete the necessary work-up to ensure our physicians have the information they need to help with your evaluation and healthcare recommendations.    Effective January 1st, 2014, we ask that you re-schedule your appointment with our physicians should you arrive 10 or more minutes late for your appointment.  We strive to give you quality time with our providers, and arriving late affects you and other patients whose appointments are after yours.    Again, thank you for choosing [facility].  Our hope is that these requests will decrease the amount of time that you wait before being seen by our physicians.       _____________________________________________________________  Should you have questions after your visit to [facility], please contact our office at [phone] between the hours of 8:30 a.m. and 5:00 p.m.  Voicemails left after 4:30 p.m. will not be returned until the following business day.  For prescription refill requests, have your pharmacy contact our office with your prescription refill request.

## 2013-07-06 LAB — CANCER ANTIGEN 27.29: CA 27.29: 24 U/mL (ref 0–39)

## 2013-07-06 LAB — CEA: CEA: 1.2 ng/mL (ref 0.0–5.0)

## 2013-07-18 ENCOUNTER — Encounter (HOSPITAL_COMMUNITY): Payer: Medicare HMO | Attending: Hematology and Oncology

## 2013-07-18 ENCOUNTER — Encounter (HOSPITAL_COMMUNITY): Payer: Self-pay

## 2013-07-18 VITALS — BP 122/73 | HR 82 | Temp 98.4°F | Resp 18 | Wt 206.6 lb

## 2013-07-18 DIAGNOSIS — K219 Gastro-esophageal reflux disease without esophagitis: Secondary | ICD-10-CM | POA: Insufficient documentation

## 2013-07-18 DIAGNOSIS — C50419 Malignant neoplasm of upper-outer quadrant of unspecified female breast: Secondary | ICD-10-CM

## 2013-07-18 DIAGNOSIS — Z170 Estrogen receptor positive status [ER+]: Secondary | ICD-10-CM | POA: Insufficient documentation

## 2013-07-18 DIAGNOSIS — I10 Essential (primary) hypertension: Secondary | ICD-10-CM | POA: Insufficient documentation

## 2013-07-18 DIAGNOSIS — Z9010 Acquired absence of unspecified breast and nipple: Secondary | ICD-10-CM | POA: Insufficient documentation

## 2013-07-18 DIAGNOSIS — C50919 Malignant neoplasm of unspecified site of unspecified female breast: Secondary | ICD-10-CM | POA: Insufficient documentation

## 2013-07-18 DIAGNOSIS — E119 Type 2 diabetes mellitus without complications: Secondary | ICD-10-CM | POA: Insufficient documentation

## 2013-07-18 NOTE — Progress Notes (Signed)
[place]  OFFICE PROGRESS NOTE  [name], MD [address], [address] [place] [address]  DIAGNOSIS: No diagnosis found.  Chief Complaint  Patient presents with  . newly diagnosed breast cancer    CURRENT THERAPY: Left breast lumpectomy with sentinel node biopsy on 06/07/2013.  INTERVAL HISTORY: Caroleen L Alfrey 68 y.o. female returns for followup and recommendations regarding adjuvant treatment for newly diagnosed left breast cancer, status post left breast lumpectomy with sentinel node biopsy on 06/07/2013.  Left breast is healing well with no bleeding, purulence, or pain. Appetite is good with no nausea, vomiting, diarrhea, constipation, dysuria, hematuria, incontinence, upper or lower extremity swelling or redness, chest pain, PND, orthopnea, headache, or seizures. Blood sugar control.  MEDICAL HISTORY: Past Medical History  Diagnosis Date  . Hypertension   . Diabetes mellitus   . GERD (gastroesophageal reflux disease)   . Bulging disc   . Breast cancer     INTERIM HISTORY: has DIABETES MELLITUS, TYPE II; OBESITY; HYPERTENSION; GERD; Back pain with radiation; NECK PAIN; Chest pain, atypical; Flank pain; Cataracts, bilateral; Family history of colon cancer; Breast cancer; and Encounter for home safety review for injury prevention on her problem list.    ALLERGIES:  is allergic to tramadol.  MEDICATIONS: has a current medication list which includes the following prescription(s): aspirin, calcium + d3, fish oil-omega-3 fatty acids, hydrocodone-acetaminophen, lisinopril-hydrochlorothiazide, meloxicam, metformin, multivitamin, pyridoxine, and UNABLE TO FIND, and the following Facility-Administered Medications: influenza (>/= 3 years) inactive virus vaccine.  SURGICAL HISTORY:  Past Surgical History  Procedure Laterality Date  . Tubal ligation    . Ovarian cyst removal    . Colonoscopy  03/26/2008    OJJ:KKXFGH rectum,  scattered pancolonic diverticula and colonic/mucosa appeared normal, normal terminal ileum.  . Colonoscopy  02/28/2003    NUR: A few scattered small diverticula throughout the colon/ No evidence of colonic polyps or neoplasm/ Polyps or other lesions which potentially cause GI blood loss  . Colonoscopy N/A 04/21/2013    Procedure: COLONOSCOPY;  Surgeon: Daneil Dolin, MD;  [facility];  Service: Endoscopy;  Laterality: N/A;  10:00  . Partial mastectomy with needle localization and axillary sentinel lymph node bx Left 06/07/2013    Procedure: PARTIAL MASTECTOMY WITH NEEDLE LOCALIZATION AND AXILLARY SENTINEL LYMPH NODE BX;  Surgeon: Jamesetta So, MD;  Location: [facility] ORS;  Service: General;  Laterality: Left;  . Re-excision of breast cancer,superior margins Left 06/21/2013    Procedure: RE-EXCISION OF BREAST CANCER;  Surgeon: Jamesetta So, MD;  Location: [facility] ORS;  Service: General;  Laterality: Left;    FAMILY HISTORY: family history includes Cancer in her brother; Colon cancer in her father; Diabetes in her brother, mother, and sister; Heart disease in her mother and sister; Hypertension in her sister.  SOCIAL HISTORY:  reports that she has quit smoking. She does not have any smokeless tobacco history on file. She reports that she does not drink alcohol or use illicit drugs.  REVIEW OF SYSTEMS:  Other than that discussed above is noncontributory.  PHYSICAL EXAMINATION: ECOG PERFORMANCE STATUS: 0 - Asymptomatic  Blood pressure 122/73, pulse 82, temperature 98.4 F (36.9 C), temperature source Oral, resp. rate 18, weight 206 lb 9.6 oz (93.713 kg), SpO2 98.00%.  GENERAL:alert, no distress and comfortable. Moderately obese. SKIN: skin color, texture, turgor are normal, no rashes or significant lesions EYES: PERLA; Conjunctiva are pink and non-injected, sclera clear SINUSES: No  redness or tenderness over maxillary or ethmoid sinuses OROPHARYNX:no exudate, no erythema on lips, buccal  mucosa, or tongue. NECK: supple, thyroid normal size, non-tender, without nodularity. No masses CHEST: Left breast status post lower outer quadrant incision healed well with no evidence of left upper extremity lymphedema. LYMPH:  no palpable lymphadenopathy in the cervical, axillary or inguinal LUNGS: clear to auscultation and percussion with normal breathing effort HEART: regular rate & rhythm and no murmurs. ABDOMEN:abdomen soft, non-tender and normal bowel sounds. Obese and soft with no palpable organomegaly. MUSCULOSKELETAL:no cyanosis of digits and no clubbing. Range of motion normal.  NEURO: alert & oriented x 3 with fluent speech, no focal motor/sensory deficits   LABORATORY DATA: Office Visit on 07/05/2013  Component Date Value Ref Range Status  . WBC 07/05/2013 8.2  4.0 - 10.5 K/uL Final  . RBC 07/05/2013 4.61  3.87 - 5.11 MIL/uL Final  . Hemoglobin 07/05/2013 12.3  12.0 - 15.0 g/dL Final  . HCT 07/05/2013 38.4  36.0 - 46.0 % Final  . MCV 07/05/2013 83.3  78.0 - 100.0 fL Final  . MCH 07/05/2013 26.7  26.0 - 34.0 pg Final  . MCHC 07/05/2013 32.0  30.0 - 36.0 g/dL Final  . RDW 07/05/2013 16.1* 11.5 - 15.5 % Final  . Platelets 07/05/2013 278  150 - 400 K/uL Final  . Neutrophils Relative % 07/05/2013 47  43 - 77 % Final  . Neutro Abs 07/05/2013 3.8  1.7 - 7.7 K/uL Final  . Lymphocytes Relative 07/05/2013 46  12 - 46 % Final  . Lymphs Abs 07/05/2013 3.8  0.7 - 4.0 K/uL Final  . Monocytes Relative 07/05/2013 5  3 - 12 % Final  . Monocytes Absolute 07/05/2013 0.4  0.1 - 1.0 K/uL Final  . Eosinophils Relative 07/05/2013 2  0 - 5 % Final  . Eosinophils Absolute 07/05/2013 0.1  0.0 - 0.7 K/uL Final  . Basophils Relative 07/05/2013 0  0 - 1 % Final  . Basophils Absolute 07/05/2013 0.0  0.0 - 0.1 K/uL Final  . Sodium 07/05/2013 141  137 - 147 mEq/L Final  . Potassium 07/05/2013 4.1  3.7 - 5.3 mEq/L Final  . Chloride 07/05/2013 102  96 - 112 mEq/L Final  . CO2 07/05/2013 24  19 - 32  mEq/L Final  . Glucose, Bld 07/05/2013 129* 70 - 99 mg/dL Final  . BUN 07/05/2013 18  6 - 23 mg/dL Final  . Creatinine, Ser 07/05/2013 0.90  0.50 - 1.10 mg/dL Final  . Calcium 07/05/2013 10.6* 8.4 - 10.5 mg/dL Final  . Total Protein 07/05/2013 8.4* 6.0 - 8.3 g/dL Final  . Albumin 07/05/2013 3.9  3.5 - 5.2 g/dL Final  . AST 07/05/2013 24  0 - 37 U/L Final  . ALT 07/05/2013 17  0 - 35 U/L Final  . Alkaline Phosphatase 07/05/2013 85  39 - 117 U/L Final  . Total Bilirubin 07/05/2013 0.3  0.3 - 1.2 mg/dL Final  . GFR calc non Af Amer 07/05/2013 65* >90 mL/min Final  . GFR calc Af Amer 07/05/2013 76* >90 mL/min Final   Comment: (NOTE)                          The eGFR has been calculated using the CKD EPI equation.                          This calculation has not been validated  in all clinical situations.                          eGFR's persistently <90 mL/min signify possible Chronic Kidney                          Disease.  . CEA 07/05/2013 1.2  0.0 - 5.0 ng/mL Final   Performed at [facility]  . CA 27.29 07/05/2013 24  0 - 39 U/mL Final   Performed at [facility] Visit on 06/28/2013  Component Date Value Ref Range Status  . Hemoglobin A1C 06/28/2013 6.4* <5.7 % Final   Comment:                                                                                                 According to the ADA Clinical Practice Recommendations for 2011, when                          HbA1c is used as a screening test:                                                       >=6.5%   Diagnostic of Diabetes Mellitus                                     (if abnormal result is confirmed)                                                     5.7-6.4%   Increased risk of developing Diabetes Mellitus                                                     References:Diagnosis and Classification of Diabetes Mellitus,Diabetes                          AGTX,6468,03(OZYYQ 1):S62-S69 and Standards  of Medical Care in                                  Diabetes - 2011,Diabetes MGNO,0370,48 (Suppl 1):S11-S61.                             . Mean Plasma Glucose 06/28/2013 137* <117 mg/dL Final  Admission on 06/21/2013, Discharged on 06/21/2013  Component Date Value Ref Range Status  . Glucose-Capillary 06/21/2013 119* 70 - 99 mg/dL Final    PATHOLOGY: Oncotype DX recurrence score of 17 (low risk) with low probability of benefit from chemotherapy.  Urinalysis    Component Value Date/Time   BILIRUBINUR negative 09/27/2012 1331   PROTEINUR negative 09/27/2012 1331   UROBILINOGEN 0.2 09/27/2012 1331   NITRITE negative 09/27/2012 1331   LEUKOCYTESUR Negative 09/27/2012 1331    RADIOGRAPHIC STUDIES: No results found.  ASSESSMENT:  #1. 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  #2. Hypertension, controlled.  #3. Diabetes mellitus, type II, non-insulin requiring, controlled.    PLAN:  #1. Anastrozole 1 mg daily for 5 years. . #2. Referral to [facility] for extremity radiotherapy to the left breast. #3. Followup in one month for tolerability of treatment. Patient was told to call should she develop hot flashes that are the dilatation.   All questions were answered. The patient knows to call the clinic with any problems, questions or concerns. We can certainly see the patient much sooner if necessary.   I spent 25 minutes counseling the patient face to face. The total time spent in the appointment was 30 minutes.    Doroteo Bradford, MD 07/18/2013 3:19 PM

## 2013-07-18 NOTE — Patient Instructions (Addendum)
[place] Discharge Instructions  RECOMMENDATIONS MADE BY THE CONSULTANT AND ANY TEST RESULTS WILL BE SENT TO YOUR REFERRING PHYSICIAN. Anastrozole 1 mg daily for 5 years. .   Referral to [facility] for extremity radiotherapy to the left breast.   Followup in one month for tolerability of treatment. Patient was told to call should she develop hot flashes that are the dilatation. Fredrik Rigger will call you for follow up. Please call us to schedule a 4 weeks follow up after you start treatments.   Thank you for choosing [facility] to provide your oncology and hematology care.  To afford each patient quality time with our providers, please arrive at least 15 minutes before your scheduled appointment time.  With your help, our goal is to use those 15 minutes to complete the necessary work-up to ensure our physicians have the information they need to help with your evaluation and healthcare recommendations.    Effective January 1st, 2014, we ask that you re-schedule your appointment with our physicians should you arrive 10 or more minutes late for your appointment.  We strive to give you quality time with our providers, and arriving late affects you and other patients whose appointments are after yours.    Again, thank you for choosing [facility].  Our hope is that these requests will decrease the amount of time that you wait before being seen by our physicians.       _____________________________________________________________  Should you have questions after your visit to [facility], please contact our office at [phone] between the hours of 8:30 a.m. and 5:00 p.m.  Voicemails left after 4:30 p.m. will not be returned until the following business day.  For prescription refill requests, have your pharmacy contact our office with your prescription refill request.

## 2013-07-19 ENCOUNTER — Telehealth (HOSPITAL_COMMUNITY): Payer: Self-pay | Admitting: *Deleted

## 2013-07-20 ENCOUNTER — Other Ambulatory Visit (HOSPITAL_COMMUNITY): Payer: Self-pay | Admitting: Hematology and Oncology

## 2013-07-20 MED ORDER — ANASTROZOLE 1 MG PO TABS: 1.0000 mg | ORAL_TABLET | Freq: Every day | ORAL | Status: DC

## 2013-08-22 ENCOUNTER — Encounter (HOSPITAL_COMMUNITY): Payer: Self-pay

## 2013-08-22 ENCOUNTER — Encounter (HOSPITAL_COMMUNITY): Payer: Medicare HMO | Attending: Hematology and Oncology

## 2013-08-22 VITALS — BP 107/65 | HR 69 | Temp 98.0°F | Resp 16 | Wt 205.1 lb

## 2013-08-22 DIAGNOSIS — C50919 Malignant neoplasm of unspecified site of unspecified female breast: Secondary | ICD-10-CM

## 2013-08-22 LAB — PHOSPHORUS: Phosphorus: 2.7 mg/dL (ref 2.3–4.6)

## 2013-08-22 MED ORDER — VENLAFAXINE HCL ER 37.5 MG PO CP24: ORAL_CAPSULE | ORAL | Status: DC

## 2013-08-22 NOTE — Addendum Note (Signed)
Addended by: Sherry Ruffing D on: 08/22/2013 09:59 AM   Modules accepted: Orders

## 2013-08-22 NOTE — Progress Notes (Signed)
[place]  OFFICE PROGRESS NOTE  Shannon Nakayama, MD [address] [place] [address]  DIAGNOSIS: Breast cancer  Hypercalcemia - Plan: Basic metabolic panel, Vitamin D 25 hydroxy, PTH, intact and calcium, Phosphorus  Chief Complaint  Patient presents with  . Breast Cancer  . Hypercalcemia    CURRENT THERAPY: Anastrozole 1 mg daily  INTERVAL HISTORY: Shannon Romero 68 y.o. female returns for followup of recently diagnosed breast cancer, status post lumpectomy and sentinel node biopsy on 06/07/2013, ER/PR positive, started on anastrozole 1 month ago. She was also referred to the need for radiotherapy. Oncotype DX recurrence score was 17. She has developed drenching hot flashes since starting anastrozole along with increasing back pain for which she was scheduled to undergo surgery in June. She is using arthritis pain for the Tylenol with a pain intensity score of 7/10. She denies any abdominal pain, nausea, vomiting, diarrhea, constipation, lymphedema, PND, orthopnea, palpitations, or headache. She started radiotherapy yesterday.  MEDICAL HISTORY: Past Medical History  Diagnosis Date  . Hypertension   . Diabetes mellitus   . GERD (gastroesophageal reflux disease)   . Bulging disc   . Breast cancer     INTERIM HISTORY: has DIABETES MELLITUS, TYPE II; OBESITY; HYPERTENSION; GERD; Back pain with radiation; NECK PAIN; Chest pain, atypical; Flank pain; Cataracts, bilateral; Family history of colon cancer; Breast cancer; and Encounter for home safety review for injury prevention on her problem list.    ALLERGIES:  is allergic to tramadol.  MEDICATIONS: has a current medication list which includes the following prescription(s): anastrozole, aspirin, calcium + d3, fish oil-omega-3 fatty acids, hydrocodone-acetaminophen, lisinopril-hydrochlorothiazide, meloxicam, metformin, multivitamin, pyridoxine, and UNABLE TO FIND, and the  following Facility-Administered Medications: influenza (>/= 3 years) inactive virus vaccine.  SURGICAL HISTORY:  Past Surgical History  Procedure Laterality Date  . Tubal ligation    . Ovarian cyst removal    . Colonoscopy  03/26/2008    JJK:[id number] rectum, scattered pancolonic diverticula and colonic/mucosa appeared normal, normal terminal ileum.  . Colonoscopy  02/28/2003    NUR: A few scattered small diverticula throughout the colon/ No evidence of colonic polyps or neoplasm/ Polyps or other lesions which potentially cause GI blood loss  . Colonoscopy N/A 04/21/2013    Procedure: COLONOSCOPY;  Surgeon: Daneil Dolin, MD;  [facility];  Service: Endoscopy;  Laterality: N/A;  10:00  . Partial mastectomy with needle localization and axillary sentinel lymph node bx Left 06/07/2013    Procedure: PARTIAL MASTECTOMY WITH NEEDLE LOCALIZATION AND AXILLARY SENTINEL LYMPH NODE BX;  Surgeon: Jamesetta So, MD;  Location: [facility] ORS;  Service: General;  Laterality: Left;  . Re-excision of breast cancer,superior margins Left 06/21/2013    Procedure: RE-EXCISION OF BREAST CANCER;  Surgeon: Jamesetta So, MD;  Location: [facility] ORS;  Service: General;  Laterality: Left;    FAMILY HISTORY: family history includes Cancer in her brother; Colon cancer in her father; Diabetes in her brother, mother, and sister; Heart disease in her mother and sister; Hypertension in her sister.  SOCIAL HISTORY:  reports that she has quit smoking. She has never used smokeless tobacco. She reports that she does not drink alcohol or use illicit drugs.  REVIEW OF SYSTEMS:  Other than that discussed above is noncontributory.  PHYSICAL EXAMINATION: ECOG PERFORMANCE STATUS: 1 - Symptomatic but completely ambulatory  Blood pressure 107/65, pulse 69, temperature 98 F (36.7 C), temperature source Oral,  resp. rate 16, weight 205 lb 1.6 oz (93.033 kg).  GENERAL:alert, no distress and comfortable SKIN: skin color, texture, turgor  are normal, no rashes or significant lesions EYES: PERLA; Conjunctiva are pink and non-injected, sclera clear SINUSES: No redness or tenderness over maxillary or ethmoid sinuses OROPHARYNX:no exudate, no erythema on lips, buccal mucosa, or tongue. NECK: supple, thyroid normal size, non-tender, without nodularity. No masses CHEST: Status post left lumpectomy with no masses in either breast. LYMPH:  no palpable lymphadenopathy in the cervical, axillary or inguinal LUNGS: clear to auscultation and percussion with normal breathing effort HEART: regular rate & rhythm and no murmurs. ABDOMEN:abdomen soft, non-tender and normal bowel sounds MUSCULOSKELETAL:no cyanosis of digits and no clubbing. Range of motion normal.. No lymphedema. Tenderness over the lumbar spine.  NEURO: alert & oriented x 3 with fluent speech, no focal motor/sensory deficits. Bilateral lower extremity hyperreflexia.   LABORATORY DATA: No visits with results within 30 Day(s) from this visit. Latest known visit with results is:  Office Visit on 07/05/2013  Component Date Value Ref Range Status  . WBC 07/05/2013 8.2  4.0 - 10.5 K/uL Final  . RBC 07/05/2013 4.61  3.87 - 5.11 MIL/uL Final  . Hemoglobin 07/05/2013 12.3  12.0 - 15.0 g/dL Final  . HCT 07/05/2013 38.4  36.0 - 46.0 % Final  . MCV 07/05/2013 83.3  78.0 - 100.0 fL Final  . MCH 07/05/2013 26.7  26.0 - 34.0 pg Final  . MCHC 07/05/2013 32.0  30.0 - 36.0 g/dL Final  . RDW 07/05/2013 16.1* 11.5 - 15.5 % Final  . Platelets 07/05/2013 278  150 - 400 K/uL Final  . Neutrophils Relative % 07/05/2013 47  43 - 77 % Final  . Neutro Abs 07/05/2013 3.8  1.7 - 7.7 K/uL Final  . Lymphocytes Relative 07/05/2013 46  12 - 46 % Final  . Lymphs Abs 07/05/2013 3.8  0.7 - 4.0 K/uL Final  . Monocytes Relative 07/05/2013 5  3 - 12 % Final  . Monocytes Absolute 07/05/2013 0.4  0.1 - 1.0 K/uL Final  . Eosinophils Relative 07/05/2013 2  0 - 5 % Final  . Eosinophils Absolute 07/05/2013 0.1   0.0 - 0.7 K/uL Final  . Basophils Relative 07/05/2013 0  0 - 1 % Final  . Basophils Absolute 07/05/2013 0.0  0.0 - 0.1 K/uL Final  . Sodium 07/05/2013 141  137 - 147 mEq/L Final  . Potassium 07/05/2013 4.1  3.7 - 5.3 mEq/L Final  . Chloride 07/05/2013 102  96 - 112 mEq/L Final  . CO2 07/05/2013 24  19 - 32 mEq/L Final  . Glucose, Bld 07/05/2013 129* 70 - 99 mg/dL Final  . BUN 07/05/2013 18  6 - 23 mg/dL Final  . Creatinine, Ser 07/05/2013 0.90  0.50 - 1.10 mg/dL Final  . Calcium 07/05/2013 10.6* 8.4 - 10.5 mg/dL Final  . Total Protein 07/05/2013 8.4* 6.0 - 8.3 g/dL Final  . Albumin 07/05/2013 3.9  3.5 - 5.2 g/dL Final  . AST 07/05/2013 24  0 - 37 U/L Final  . ALT 07/05/2013 17  0 - 35 U/L Final  . Alkaline Phosphatase 07/05/2013 85  39 - 117 U/L Final  . Total Bilirubin 07/05/2013 0.3  0.3 - 1.2 mg/dL Final  . GFR calc non Af Amer 07/05/2013 65* >90 mL/min Final  . GFR calc Af Amer 07/05/2013 76* >90 mL/min Final   Comment: (NOTE)  The eGFR has been calculated using the CKD EPI equation.                          This calculation has not been validated in all clinical situations.                          eGFR's persistently <90 mL/min signify possible Chronic Kidney                          Disease.  . CEA 07/05/2013 1.2  0.0 - 5.0 ng/mL Final   Performed at [facility]  . CA 27.29 07/05/2013 24  0 - 39 U/mL Final   Performed at [facility]: No new pathology.  Urinalysis    Component Value Date/Time   BILIRUBINUR negative 09/27/2012 1331   PROTEINUR negative 09/27/2012 1331   UROBILINOGEN 0.2 09/27/2012 1331   NITRITE negative 09/27/2012 1331   LEUKOCYTESUR Negative 09/27/2012 1331    RADIOGRAPHIC STUDIES: No results found.  ASSESSMENT:  #1. 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  #2. Hypertension, controlled.  #3. Diabetes mellitus, type II, non-insulin requiring, controlled. #4. Vasomotor instability plus increasing joint pain secondary to anastrozole. #5. Hypercalcemia.    PLAN:  #1. Venlafaxine 37.5 mg at bedtime increasing to 75 mg hot flashes and possibly with pain. #2. Call back in 10-14 days regarding relief. #3. Continue daily radiotherapy. #4. Continue anastrozole 1 mg daily. #5. PTH, basic metabolic profile, vitamin D level today. Patient may need to cut back on calcium supplements. #6. Office visit 3 months with CBC, chem profile, CEA 2729, and CEA.   All questions were answered. The patient knows to call the clinic with any problems, questions or concerns. We can certainly see the patient much sooner if necessary.   I spent 25 minutes counseling the patient face to face. The total time spent in the appointment was 30 minutes.    Farrel Gobble, MD 08/22/2013 9:26 AM  DISCLAIMER:  This note was dictated with voice recognition software.  Similar sounding words can inadvertently be transcribed inaccurately and may not be corrected upon review.

## 2013-08-22 NOTE — Patient Instructions (Signed)
[facility] Discharge Instructions  RECOMMENDATIONS MADE BY THE CONSULTANT AND ANY TEST RESULTS WILL BE SENT TO YOUR REFERRING PHYSICIAN.  EXAM FINDINGS BY THE PHYSICIAN TODAY AND SIGNS OR SYMPTOMS TO REPORT TO CLINIC OR PRIMARY PHYSICIAN: Exam and findings as discussed by Dr. Barnet Glasgow.  Continue with the over the counter arthritis medication for your pain and MD will add venlafaxine to see if it helps with your discomfort and hot flashes.  Start out with 37.5mg  at bedtime and if needed after 4 days you can increase it to 75 mg at bedtime.  Call us in 10 - 14 days and let us know how you are doing.  Will check some blood work today to see if we can determine why your calcium level is high.  MEDICATIONS PRESCRIBED:  Venlafaxine - take as directed.  INSTRUCTIONS/FOLLOW-UP: Follow-up in 3 months with blood work and office visit.  Thank you for choosing [facility] to provide your oncology and hematology care.  To afford each patient quality time with our providers, please arrive at least 15 minutes before your scheduled appointment time.  With your help, our goal is to use those 15 minutes to complete the necessary work-up to ensure our physicians have the information they need to help with your evaluation and healthcare recommendations.    Effective January 1st, 2014, we ask that you re-schedule your appointment with our physicians should you arrive 10 or more minutes late for your appointment.  We strive to give you quality time with our providers, and arriving late affects you and other patients whose appointments are after yours.    Again, thank you for choosing [facility].  Our hope is that these requests will decrease the amount of time that you wait before being seen by our physicians.       _____________________________________________________________  Should you have questions after your visit to [facility], please contact our  office at [phone] between the hours of 8:30 a.m. and 5:00 p.m.  Voicemails left after 4:30 p.m. will not be returned until the following business day.  For prescription refill requests, have your pharmacy contact our office with your prescription refill request.

## 2013-08-23 LAB — VITAMIN D 25 HYDROXY (VIT D DEFICIENCY, FRACTURES): VIT D 25 HYDROXY: 40 ng/mL (ref 30–89)

## 2013-08-23 LAB — PTH, INTACT AND CALCIUM
CALCIUM TOTAL (PTH): 10 mg/dL (ref 8.4–10.5)
PTH: 37.9 pg/mL (ref 14.0–72.0)

## 2013-09-05 ENCOUNTER — Telehealth (HOSPITAL_COMMUNITY): Payer: Self-pay | Admitting: *Deleted

## 2013-09-05 NOTE — Telephone Encounter (Signed)
Patient called to report that hot flashes have gotten much better on the Effexor. She increased dose to 70 mg 2 weeks ago. Her only c/o is that she is urinating more frequently at night, about every 2 hours. Denies burning/pain. She is taking the Effexor with 8 oz water.

## 2013-09-07 ENCOUNTER — Telehealth (HOSPITAL_COMMUNITY): Payer: Self-pay | Admitting: *Deleted

## 2013-09-07 NOTE — Telephone Encounter (Signed)
Patient notified that per Dr. Barnet Glasgow she can take effexor with small amt water

## 2013-10-01 ENCOUNTER — Other Ambulatory Visit: Payer: Self-pay | Admitting: Family Medicine

## 2013-10-16 ENCOUNTER — Other Ambulatory Visit (HOSPITAL_COMMUNITY)
Admission: RE | Admit: 2013-10-16 | Discharge: 2013-10-16 | Disposition: A | Discharge status: Home / Self Care | Payer: Medicare HMO | Source: Ambulatory Visit | Attending: Family Medicine | Admitting: Family Medicine

## 2013-10-16 ENCOUNTER — Ambulatory Visit (INDEPENDENT_AMBULATORY_CARE_PROVIDER_SITE_OTHER): Payer: Medicare HMO | Admitting: Family Medicine

## 2013-10-16 ENCOUNTER — Encounter: Payer: Self-pay | Admitting: Family Medicine

## 2013-10-16 VITALS — BP 120/64 | HR 78 | Resp 18 | Ht 63.0 in | Wt 202.1 lb

## 2013-10-16 DIAGNOSIS — Z0000 Encounter for general adult medical examination without abnormal findings: Secondary | ICD-10-CM

## 2013-10-16 DIAGNOSIS — Z124 Encounter for screening for malignant neoplasm of cervix: Secondary | ICD-10-CM

## 2013-10-16 DIAGNOSIS — E119 Type 2 diabetes mellitus without complications: Secondary | ICD-10-CM

## 2013-10-16 DIAGNOSIS — I10 Essential (primary) hypertension: Secondary | ICD-10-CM

## 2013-10-16 DIAGNOSIS — Z1211 Encounter for screening for malignant neoplasm of colon: Secondary | ICD-10-CM

## 2013-10-16 DIAGNOSIS — M549 Dorsalgia, unspecified: Secondary | ICD-10-CM

## 2013-10-16 LAB — LIPID PANEL
CHOL/HDL RATIO: 3 ratio
Cholesterol: 149 mg/dL (ref 0–200)
HDL: 49 mg/dL (ref >39)
LDL CALC: 87 mg/dL (ref 0–99)
TRIGLYCERIDES: 67 mg/dL (ref <150)
VLDL: 13 mg/dL (ref 0–40)

## 2013-10-16 LAB — COMPLETE METABOLIC PANEL WITH GFR
ALBUMIN: 4 g/dL (ref 3.5–5.2)
ALT: 18 U/L (ref 0–35)
AST: 16 U/L (ref 0–37)
Alkaline Phosphatase: 65 U/L (ref 39–117)
BUN: 22 mg/dL (ref 6–23)
CO2: 27 mEq/L (ref 19–32)
Calcium: 10 mg/dL (ref 8.4–10.5)
Chloride: 104 mEq/L (ref 96–112)
Creat: 1.01 mg/dL (ref 0.50–1.10)
GFR, EST NON AFRICAN AMERICAN: 58 mL/min — AB
GFR, Est African American: 67 mL/min
GLUCOSE: 106 mg/dL — AB (ref 70–99)
POTASSIUM: 4.8 meq/L (ref 3.5–5.3)
Sodium: 138 mEq/L (ref 135–145)
Total Bilirubin: 0.3 mg/dL (ref 0.2–1.2)
Total Protein: 6.8 g/dL (ref 6.0–8.3)

## 2013-10-16 LAB — HEMOGLOBIN A1C
HEMOGLOBIN A1C: 6.5 % — AB (ref <5.7)
Mean Plasma Glucose: 140 mg/dL — ABNORMAL HIGH (ref <117)

## 2013-10-16 LAB — POC HEMOCCULT BLD/STL (OFFICE/1-CARD/DIAGNOSTIC): FECAL OCCULT BLD: NEGATIVE

## 2013-10-16 MED ORDER — HYDROCODONE-ACETAMINOPHEN 5-325 MG PO TABS: ORAL_TABLET | ORAL | Status: DC

## 2013-10-16 MED ORDER — KETOROLAC TROMETHAMINE 60 MG/2ML IM SOLN
60.0000 mg | Freq: Once | INTRAMUSCULAR | Status: AC
Start: 2013-10-16 — End: 2013-10-16
  Administered 2013-10-16: 60 mg via INTRAMUSCULAR

## 2013-10-16 NOTE — Assessment & Plan Note (Signed)
Annual exam as documented. Counseling done  re healthy lifestyle involving commitment to 150 minutes exercise per week, heart healthy diet, and attaining healthy weight.The importance of adequate sleep also discussed. Regular seat belt use  is also discussed.gun safety discussed Changes in health habits are decided on by the patient with goals and time frames  set for achieving them. Immunization and cancer screening needs are specifically addressed at this visit.

## 2013-10-16 NOTE — Assessment & Plan Note (Signed)
Uncontrolled, toradol in office , pain contract for once daily hydrocodone

## 2013-10-16 NOTE — Patient Instructions (Addendum)
Initial annual wellness for medicare in 3.5 to 4 month, call if you need me before  New medication one daly for back pain, you will need to sign a narcotiic  pain contract also also toradol in office for back pain  Thankful that you have completed radiation  treatment   Microalb, hBA1C , tsh in 3.65 to 4 month

## 2013-10-17 ENCOUNTER — Other Ambulatory Visit: Payer: Self-pay

## 2013-10-17 LAB — CYTOLOGY - PAP

## 2013-10-17 MED ORDER — LISINOPRIL-HYDROCHLOROTHIAZIDE 20-25 MG PO TABS: ORAL_TABLET | ORAL | Status: DC

## 2013-10-17 MED ORDER — METFORMIN HCL 1000 MG PO TABS: ORAL_TABLET | ORAL | Status: DC

## 2013-11-10 ENCOUNTER — Other Ambulatory Visit: Payer: Self-pay

## 2013-11-10 DIAGNOSIS — M549 Dorsalgia, unspecified: Secondary | ICD-10-CM

## 2013-11-10 MED ORDER — HYDROCODONE-ACETAMINOPHEN 5-325 MG PO TABS: ORAL_TABLET | ORAL | Status: DC

## 2013-11-21 ENCOUNTER — Encounter (HOSPITAL_COMMUNITY): Payer: Medicare HMO | Attending: Hematology and Oncology

## 2013-11-21 ENCOUNTER — Encounter (HOSPITAL_BASED_OUTPATIENT_CLINIC_OR_DEPARTMENT_OTHER): Payer: Medicare HMO

## 2013-11-21 ENCOUNTER — Encounter (HOSPITAL_COMMUNITY): Payer: Self-pay

## 2013-11-21 VITALS — BP 113/53 | HR 67 | Resp 16 | Wt 202.6 lb

## 2013-11-21 DIAGNOSIS — C50919 Malignant neoplasm of unspecified site of unspecified female breast: Secondary | ICD-10-CM

## 2013-11-21 DIAGNOSIS — C50912 Malignant neoplasm of unspecified site of left female breast: Secondary | ICD-10-CM

## 2013-11-21 LAB — CBC WITH DIFFERENTIAL/PLATELET
BASOS ABS: 0 10*3/uL (ref 0.0–0.1)
Basophils Relative: 1 % (ref 0–1)
EOS ABS: 0.1 10*3/uL (ref 0.0–0.7)
EOS PCT: 2 % (ref 0–5)
HEMATOCRIT: 35.3 % — AB (ref 36.0–46.0)
Hemoglobin: 11.8 g/dL — ABNORMAL LOW (ref 12.0–15.0)
Lymphocytes Relative: 31 % (ref 12–46)
Lymphs Abs: 1.8 10*3/uL (ref 0.7–4.0)
MCH: 27.6 pg (ref 26.0–34.0)
MCHC: 33.4 g/dL (ref 30.0–36.0)
MCV: 82.7 fL (ref 78.0–100.0)
MONO ABS: 0.3 10*3/uL (ref 0.1–1.0)
Monocytes Relative: 5 % (ref 3–12)
Neutro Abs: 3.5 10*3/uL (ref 1.7–7.7)
Neutrophils Relative %: 61 % (ref 43–77)
Platelets: 276 10*3/uL (ref 150–400)
RBC: 4.27 MIL/uL (ref 3.87–5.11)
RDW: 16.7 % — ABNORMAL HIGH (ref 11.5–15.5)
WBC: 5.7 10*3/uL (ref 4.0–10.5)

## 2013-11-21 MED ORDER — VENLAFAXINE HCL ER 75 MG PO CP24: ORAL_CAPSULE | ORAL | Status: DC

## 2013-11-21 NOTE — Progress Notes (Signed)
[facility]  OFFICE PROGRESS NOTE  [name], MD [address] [place] [address]  DIAGNOSIS: Breast cancer, left - Plan: venlafaxine XR (EFFEXOR-XR) 75 MG 24 hr capsule  Hypercalcemia - Plan: venlafaxine XR (EFFEXOR-XR) 75 MG 24 hr capsule  Chief Complaint  Patient presents with  . Breast Cancer    follow-up    CURRENT THERAPY: Anastrozole 1 mg daily,  radiotherapy to the left breast in that on 10/05/2013.  INTERVAL HISTORY: Shannon Romero 68 y.o. female returns for followup of breast cancer status post lumpectomy and sentinel node biopsy on 06/07/2013, ER/PR positive, started on anastrozole in April 2015. Hot flashes have diminished in number and frequency since initiating therapy with Effexor XR 75 mg at bedtime. Appetite is good with no nausea, vomiting, diarrhea, constipation, dysuria, hematuria, incontinence, lower extremity swelling or redness, chest pain, PND, orthopnea, or palpitations. She has noticed vaginal dryness. She said some increase in back pain from previous disc disease. She denies any myalgias.    MEDICAL HISTORY: Past Medical History  Diagnosis Date  . Hypertension   . Diabetes mellitus   . GERD (gastroesophageal reflux disease)   . Bulging disc   . Breast cancer     INTERIM HISTORY: has DIABETES MELLITUS, TYPE II; OBESITY; HYPERTENSION; GERD; Back pain with radiation; NECK PAIN; Chest pain, atypical; Flank pain; Cataracts, bilateral; Family history of colon cancer; Breast cancer; Encounter for home safety review for injury prevention; and Encounter for annual physical exam on her problem list.    ALLERGIES:  is allergic to tramadol.  MEDICATIONS: has a current medication list which includes the following prescription(s): anastrozole, aspirin, calcium + d3, fish oil-omega-3 fatty acids, hydrocodone-acetaminophen, lisinopril-hydrochlorothiazide, meloxicam, metformin, multivitamin, pyridoxine,  UNABLE TO FIND, and venlafaxine xr, and the following Facility-Administered Medications: influenza (>/= 3 years) inactive virus vaccine.  SURGICAL HISTORY:  Past Surgical History  Procedure Laterality Date  . Tubal ligation    . Ovarian cyst removal    . Colonoscopy  03/26/2008    [facility]:BULAGT rectum, scattered pancolonic diverticula and colonic/mucosa appeared normal, normal terminal ileum.  . Colonoscopy  02/28/2003    NUR: A few scattered small diverticula throughout the colon/ No evidence of colonic polyps or neoplasm/ Polyps or other lesions which potentially cause GI blood loss  . Colonoscopy N/A 04/21/2013    Procedure: COLONOSCOPY;  Surgeon: Daneil Dolin, MD;  Location: [facility];  Service: Endoscopy;  Laterality: N/A;  10:00  . Partial mastectomy with needle localization and axillary sentinel lymph node bx Left 06/07/2013    Procedure: PARTIAL MASTECTOMY WITH NEEDLE LOCALIZATION AND AXILLARY SENTINEL LYMPH NODE BX;  Surgeon: Jamesetta So, MD;  Location: [facility] ORS;  Service: General;  Laterality: Left;  . Re-excision of breast cancer,superior margins Left 06/21/2013    Procedure: RE-EXCISION OF BREAST CANCER;  Surgeon: Jamesetta So, MD;  Location: [facility] ORS;  Service: General;  Laterality: Left;    FAMILY HISTORY: family history includes Cancer in her brother; Colon cancer in her father; Diabetes in her brother, mother, and sister; Heart disease in her mother and sister; Hypertension in her sister.  SOCIAL HISTORY:  reports that she has quit smoking. She has never used smokeless tobacco. She reports that she does not drink alcohol or use illicit drugs.  REVIEW OF SYSTEMS:  Other than that discussed above is noncontributory.  PHYSICAL EXAMINATION: ECOG PERFORMANCE STATUS: 1 - Symptomatic but completely ambulatory  Blood pressure 113/53, pulse 67, resp. rate 16, weight 202 lb 9.6 oz (91.899 kg).  GENERAL:alert, no distress and comfortable. Moderately obese. SKIN: skin color,  texture, turgor are normal, no rashes or significant lesions EYES: PERLA; Conjunctiva are pink and non-injected, sclera clear SINUSES: No redness or tenderness over maxillary or ethmoid sinuses OROPHARYNX:no exudate, no erythema on lips, buccal mucosa, or tongue. NECK: supple, thyroid normal size, non-tender, without nodularity. No masses CHEST: Status post left breast lumpectomy with hyperpigmentation changes of radiation. LYMPH:  no palpable lymphadenopathy in the cervical, axillary or inguinal LUNGS: clear to auscultation and percussion with normal breathing effort HEART: regular rate & rhythm and no murmurs. ABDOMEN:abdomen soft, non-tender and normal bowel sounds MUSCULOSKELETAL:no cyanosis of digits and no clubbing. Range of motion normal.  NEURO: alert & oriented x 3 with fluent speech, no focal motor/sensory deficits   LABORATORY DATA: Results for Fehnel, KADIJAH SHAMOON (MRN [id number]) as of 11/21/2013 11:09  Ref. Range 08/22/2013 09:39  PTH Latest Range: 14.0-72.0 pg/mL 37.9    Lab on 11/21/2013  Component Date Value Ref Range Status  . WBC 11/21/2013 5.7  4.0 - 10.5 K/uL Final  . RBC 11/21/2013 4.27  3.87 - 5.11 MIL/uL Final  . Hemoglobin 11/21/2013 11.8* 12.0 - 15.0 g/dL Final  . HCT 11/21/2013 35.3* 36.0 - 46.0 % Final  . MCV 11/21/2013 82.7  78.0 - 100.0 fL Final  . MCH 11/21/2013 27.6  26.0 - 34.0 pg Final  . MCHC 11/21/2013 33.4  30.0 - 36.0 g/dL Final  . RDW 11/21/2013 16.7* 11.5 - 15.5 % Final  . Platelets 11/21/2013 276  150 - 400 K/uL Final  . Neutrophils Relative % 11/21/2013 61  43 - 77 % Final  . Neutro Abs 11/21/2013 3.5  1.7 - 7.7 K/uL Final  . Lymphocytes Relative 11/21/2013 31  12 - 46 % Final  . Lymphs Abs 11/21/2013 1.8  0.7 - 4.0 K/uL Final  . Monocytes Relative 11/21/2013 5  3 - 12 % Final  . Monocytes Absolute 11/21/2013 0.3  0.1 - 1.0 K/uL Final  . Eosinophils Relative 11/21/2013 2  0 - 5 % Final  . Eosinophils Absolute 11/21/2013 0.1  0.0 - 0.7 K/uL Final   . Basophils Relative 11/21/2013 1  0 - 1 % Final  . Basophils Absolute 11/21/2013 0.0  0.0 - 0.1 K/uL Final    PATHOLOGY:  for [place], Nimco L ([id number]) Patient: Baquero, Mackie L Collected: 10/16/2013 Client: [facility] Accession: [id number] Received: 10/16/2013 [name], MD DOB: 24-Aug-1945 Age: 24 Gender: F Reported: 10/17/2013 [place], [address] Patient Ph: [phone] MRN#: [id number] [facility] [place] [address] Client Acc#: Chart: Phone: [phone] Fax: LMP: Visit#: [id number] CC: GYNECOLOGIC CYTOLOGY REPORT Adequacy Reason Satisfactory for evaluation, endocervical/transformation zone component PRESENT. Diagnosis NEGATIVE FOR INTRAEPITHELIAL LESIONS OR MALIGNANCY. TANYA SPEED Cytotechnologist Electronic Signature (Case signed 10/17/2013) Source CervicoVaginal Pap [ThinPrep Imaged] Note: The PAP test is a screening test, not a diagnostic procedure and should not be used as the sole means of detecting cervical cancer. The pap technique in the best of hands is subject to both false negative and false positive result at low but significant rates as evidenced by published data. This result should be interpreted in the context of this data, plus your patient's history and clinical findings Report signed out from the following location(s) Technical Component and Interpretation performed at [facility] [place], [place], [place] [address]. CLIA #: [id number],    Urinalysis  Component Value Date/Time   BILIRUBINUR negative 09/27/2012 1331   PROTEINUR negative 09/27/2012 1331   UROBILINOGEN 0.2 09/27/2012 1331   NITRITE negative 09/27/2012 1331   LEUKOCYTESUR Negative 09/27/2012 1331    RADIOGRAPHIC STUDIES: 01/23/2013: DEXA scan normal  ASSESSMENT:  #1. 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  #2. Hypertension, controlled.  #3. Diabetes mellitus, type II, non-insulin requiring, controlled. #4. Vasomotor instability control with Effexor XR 75 mg at bedtime. #5. Transient hypercalcemia probably secondary to hydrochlorothiazide therapy.    PLAN:  #1. Continue anastrozole 1 mg daily. #2. Continue Effexor XR 75 mg at bedtime along with calcium and vitamin D supplements. #3. Followup in 3 months without labs.   All questions were answered. The patient knows to call the clinic with any problems, questions or concerns. We can certainly see the patient much sooner if necessary.   I spent 25 minutes counseling the patient face to face. The total time spent in the appointment was 30 minutes.    Doroteo Bradford, MD 11/21/2013 12:38 PM  DISCLAIMER:  This note was dictated with voice recognition software.  Similar sounding words can inadvertently be transcribed inaccurately and may not be corrected upon review.

## 2013-11-21 NOTE — Patient Instructions (Signed)
[facility] Discharge Instructions  RECOMMENDATIONS MADE BY THE CONSULTANT AND ANY TEST RESULTS WILL BE SENT TO YOUR REFERRING PHYSICIAN.  EXAM FINDINGS BY THE PHYSICIAN TODAY AND SIGNS OR SYMPTOMS TO REPORT TO CLINIC OR PRIMARY PHYSICIAN: Exam and findings as discussed by Dr. Barnet Glasgow.  You are doing well.  If there are any concerns with your labs we will contact you. Report any new lumps, bone pain, shortness of breath or other symptoms.  MEDICATIONS PRESCRIBED:  Effexor 75 mg at bedtime  INSTRUCTIONS/FOLLOW-UP: Follow-up in 3 months with office visit.  Thank you for choosing [facility] to provide your oncology and hematology care.  To afford each patient quality time with our providers, please arrive at least 15 minutes before your scheduled appointment time.  With your help, our goal is to use those 15 minutes to complete the necessary work-up to ensure our physicians have the information they need to help with your evaluation and healthcare recommendations.    Effective January 1st, 2014, we ask that you re-schedule your appointment with our physicians should you arrive 10 or more minutes late for your appointment.  We strive to give you quality time with our providers, and arriving late affects you and other patients whose appointments are after yours.    Again, thank you for choosing [facility].  Our hope is that these requests will decrease the amount of time that you wait before being seen by our physicians.       _____________________________________________________________  Should you have questions after your visit to [facility], please contact our office at [phone] between the hours of 8:30 a.m. and 4:30 p.m.  Voicemails left after 4:30 p.m. will not be returned until the following business day.  For prescription refill requests, have your pharmacy contact our office with your prescription refill request.     _______________________________________________________________  We hope that we have given you very good care.  You may receive a patient satisfaction survey in the mail, please complete it and return it as soon as possible.  We value your feedback!  _______________________________________________________________  Have you asked about our STAR program?  STAR stands for Survivorship Training and Rehabilitation, and this is a nationally recognized cancer care program that focuses on survivorship and rehabilitation.  Cancer and cancer treatments may cause problems, such as, pain, making you feel tired and keeping you from doing the things that you need or want to do. Cancer rehabilitation can help. Our goal is to reduce these troubling effects and help you have the best quality of life possible.  You may receive a survey from a nurse that asks questions about your current state of health.  Based on the survey results, all eligible patients will be referred to the [facility] program for an evaluation so we can better serve you!  A frequently asked questions sheet is available upon request.

## 2013-11-21 NOTE — Progress Notes (Signed)
Labs drawn for cbcd,cea,[id number]

## 2013-11-22 LAB — CANCER ANTIGEN 27.29: CA 27.29: 22 U/mL (ref 0–39)

## 2013-11-22 LAB — CEA: CEA: 0.9 ng/mL (ref 0.0–5.0)

## 2013-11-28 ENCOUNTER — Other Ambulatory Visit: Payer: Self-pay | Admitting: Family Medicine

## 2013-12-15 ENCOUNTER — Other Ambulatory Visit: Payer: Self-pay

## 2013-12-15 DIAGNOSIS — M549 Dorsalgia, unspecified: Secondary | ICD-10-CM

## 2013-12-15 MED ORDER — HYDROCODONE-ACETAMINOPHEN 5-325 MG PO TABS: ORAL_TABLET | ORAL | Status: DC

## 2013-12-28 ENCOUNTER — Other Ambulatory Visit: Payer: Self-pay | Admitting: Family Medicine

## 2014-01-16 ENCOUNTER — Ambulatory Visit (INDEPENDENT_AMBULATORY_CARE_PROVIDER_SITE_OTHER): Payer: Medicare HMO

## 2014-01-16 DIAGNOSIS — Z23 Encounter for immunization: Secondary | ICD-10-CM

## 2014-02-05 DIAGNOSIS — Z1211 Encounter for screening for malignant neoplasm of colon: Secondary | ICD-10-CM | POA: Insufficient documentation

## 2014-02-05 NOTE — Progress Notes (Signed)
   Subjective:    Patient ID: Shannon Romero, female    DOB: 1946/03/14, 68 y.o.   MRN: [id number]  HPI Patient is in for pelvic and breast exam. C/o increased and uncontrolled back pain , no recent trauma, requests chronic pain management issues be addressed  Review of Systems See HPI     Objective:   Physical Exam BP 120/64  Pulse 78  Resp 18  Ht 5\' 3"  (1.6 m)  Wt 202 lb 1.3 oz (91.663 kg)  BMI 35.81 kg/m2  SpO2 99%  Pleasant well nourished female, alert and oriented x 3, in no cardio-pulmonary distress. Afebrile. HEENT No facial trauma or asymetry. Sinuses non tender.  Extra occullar muscles intact, pupils equally reactive to light. External ears normal, tympanic membranes clear. Oropharynx moist, no exudate, fairly  good dentition. Neck: supple, no adenopathy,JVD or thyromegaly.No bruits.  Chest: Clear to ascultation bilaterally.No crackles or wheezes. Non tender to palpation  Breast: No asymetry,no masses or lumps. No tenderness. No nipple discharge or inversion. No axillary or supraclavicular adenopathy  Cardiovascular system; Heart sounds normal,  S1 and  S2 ,no S3.  No murmur, or thrill. Apical beat not displaced Peripheral pulses normal.  Abdomen: Soft, non tender, no organomegaly or masses. No bruits. Bowel sounds normal. No guarding, tenderness or rebound.  Rectal:  Normal sphincter tone. No mass.No rectal masses.  Guaiac negative stool.  GU: External genitalia normal female genitalia , female distribution of hair. No lesions. Urethral meatus normal in size, no  Prolapse, no lesions visibly  Present. Bladder non tender. Vagina pink and moist , with no visible lesions , discharge present . Adequate pelvic support no  cystocele or rectocele noted Cervix pink and appears healthy, no lesions or ulcerations noted, no discharge noted from os Uterus normal size, no adnexal masses, no cervical motion or adnexal tenderness.   Musculoskeletal  exam: Decreased  ROM of spine, adequate in hips , shoulders and knees. No deformity ,swelling or crepitus noted. No muscle wasting or atrophy.   Neurologic: Cranial nerves 2 to 12 intact. Power, tone ,sensation and reflexes normal throughout. No disturbance in gait. No tremor.  Skin: Intact, no ulceration, erythema , scaling or rash noted. Pigmentation normal throughout  Psych; Normal mood and affect. Judgement and concentration normal        Assessment & Plan:  Encounter for annual physical exam Annual exam as documented. Counseling done  re healthy lifestyle involving commitment to 150 minutes exercise per week, heart healthy diet, and attaining healthy weight.The importance of adequate sleep also discussed. Regular seat belt use  is also discussed.gun safety discussed Changes in health habits are decided on by the patient with goals and time frames  set for achieving them. Immunization and cancer screening needs are specifically addressed at this visit.   Back pain with radiation Uncontrolled, toradol in office , pain contract for once daily hydrocodone  Special screening for malignant neoplasms, colon Rectal exam negative for mass, heme negative stool preseent

## 2014-02-05 NOTE — Assessment & Plan Note (Signed)
Rectal exam negative for mass, heme negative stool preseent

## 2014-02-12 LAB — TSH: TSH: 1.653 u[IU]/mL (ref 0.350–4.500)

## 2014-02-13 LAB — HEMOGLOBIN A1C
Hgb A1c MFr Bld: 6.7 % — ABNORMAL HIGH (ref <5.7)
Mean Plasma Glucose: 146 mg/dL — ABNORMAL HIGH (ref <117)

## 2014-02-13 LAB — MICROALBUMIN / CREATININE URINE RATIO
CREATININE, URINE: 158.2 mg/dL
Microalb Creat Ratio: 3.2 mg/g (ref 0.0–30.0)
Microalb, Ur: 0.5 mg/dL (ref <2.0)

## 2014-02-16 ENCOUNTER — Other Ambulatory Visit: Payer: Self-pay

## 2014-02-16 ENCOUNTER — Ambulatory Visit (INDEPENDENT_AMBULATORY_CARE_PROVIDER_SITE_OTHER): Payer: Medicare HMO | Admitting: Family Medicine

## 2014-02-16 ENCOUNTER — Encounter: Payer: Self-pay | Admitting: Family Medicine

## 2014-02-16 VITALS — BP 120/80 | HR 75 | Resp 16 | Ht 63.0 in | Wt 207.0 lb

## 2014-02-16 DIAGNOSIS — Z13220 Encounter for screening for lipoid disorders: Secondary | ICD-10-CM

## 2014-02-16 DIAGNOSIS — M549 Dorsalgia, unspecified: Secondary | ICD-10-CM

## 2014-02-16 DIAGNOSIS — E119 Type 2 diabetes mellitus without complications: Secondary | ICD-10-CM

## 2014-02-16 DIAGNOSIS — Z0000 Encounter for general adult medical examination without abnormal findings: Secondary | ICD-10-CM

## 2014-02-16 MED ORDER — HYDROCODONE-ACETAMINOPHEN 5-325 MG PO TABS: ORAL_TABLET | ORAL | Status: DC

## 2014-02-16 MED ORDER — GABAPENTIN 300 MG PO CAPS: 300.0000 mg | ORAL_CAPSULE | Freq: Two times a day (BID) | ORAL | Status: DC

## 2014-02-16 MED ORDER — PREDNISONE (PAK) 5 MG PO TABS: ORAL_TABLET | ORAL | Status: DC

## 2014-02-16 MED ORDER — KETOROLAC TROMETHAMINE 60 MG/2ML IM SOLN
60.0000 mg | Freq: Once | INTRAMUSCULAR | Status: AC
  Administered 2014-02-16: 60 mg via INTRAMUSCULAR

## 2014-02-16 MED ORDER — METHYLPREDNISOLONE ACETATE 80 MG/ML IJ SUSP
80.0000 mg | Freq: Once | INTRAMUSCULAR | Status: AC
  Administered 2014-02-16: 80 mg via INTRAMUSCULAR

## 2014-02-16 NOTE — Assessment & Plan Note (Signed)

## 2014-02-16 NOTE — Patient Instructions (Addendum)
F/u in 4 month, call if you need me before  Injections in office today for uncontrolled back pain ,. toradol and depo medrol   Prednisone sent in for 6 days  New for chronic pain is gabapentin 300 mg one twice daily  Please get eye exam in December, when due  Labs are excellent, no changes in medication  Fasting lipid, cmp and EGFr , HBa1C in 4 months

## 2014-02-16 NOTE — Progress Notes (Signed)
Subjective:    Patient ID: Shannon Romero, female    DOB: August 08, 1945, 68 y.o.   MRN: [id number]  HPI Preventive Screening-Counseling & Management   Patient present here today for a Medicare annual wellness visit. C/o uncontrolled and increased low back pain radiatng to lower extremities, progressing over past 2 months, increased debility, and reduced mobility , wants help  Also wants to review recent lab data re diabetes   Current Problems (verified)   Medications Prior to Visit Allergies (verified)   PAST HISTORY  Family History (verified)   Social History Married with 1 child, currently disabled   Risk Factors  Current exercise habits:  No exercise routine currently   Dietary issues discussed: heart healthy diet discussed, eating more fruits and vegetables, tries to limit her fried fatty foods    Cardiac risk factors: heart disease with her mother at age 57 and 2 sisters diagnosed in their mid 69's , pt is also diabetic  Depression Screen  (Note: if answer to either of the following is "Yes", a more complete depression screening is indicated)   Over the past two weeks, have you felt down, depressed or hopeless? No  Over the past two weeks, have you felt little interest or pleasure in doing things? No  Have you lost interest or pleasure in daily life? No  Do you often feel hopeless? No  Do you cry easily over simple problems? No   Activities of Daily Living  In your present state of health, do you have any difficulty performing the following activities?  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 Climbing a flight of stairs?:yes Preparing food and eating?:No Bathing or showering?:No Getting dressed?:No Getting to the toilet?:No Using the toilet?:No Moving around from place to place?: yes uses a cane , or holds on to furniture   Fall Risk  Assessment In the past year have you fallen or had a near fall?:No, but high fall risk due to severe arthritis in spine causing uncontrolled pain and lower extremity weakness Are you currently taking any medications that make you dizzy   Hearing Difficulties: No Do you often ask people to speak up or repeat themselves?: uses hearing aids x 3 years Do you experience ringing or noises in your ears?:No Do you have difficulty understanding soft or whispered voices?:uses hearing aids   Cognitive Testing  Alert? Yes Normal Appearance?Yes  Oriented to person? Yes Place? Yes  Time? Yes  Displays appropriate judgment?Yes  Can read the correct time from a watch face? yes Are you having problems remembering things?No  Advanced Directives have been discussed with the patient?Yes, doesn't have a living will but brochure given for review    List the Names of Other Physician/Practitioners you currently use:  Dr Barnet Glasgow (oncology)   Indicate any recent Medical Services you may have received from other than [facility] providers in the past year (date may be approximate).   Assessment:    Annual Wellness Exam   Plan:     Medicare Attestation  I have personally reviewed:  The patient's medical and social history  Their use of alcohol, tobacco or illicit drugs  Their current medications and supplements  The patient's functional ability including ADLs,fall risks, home safety risks, cognitive, and hearing and visual impairment  Diet and physical activities  Evidence for depression or mood disorders  The patient's weight, height, BMI, and visual  acuity have been recorded in the chart. I have made referrals, counseling, and provided education to the patient based on review of the above and I have provided the patient with a written personalized care plan for preventive services.      Review of Systems     Objective:   Physical Exam BP 120/80 mmHg  Pulse 75  Resp 16  Ht 5\' 3"  (1.6 m)  Wt 207  lb (93.895 kg)  BMI 36.68 kg/m2  SpO2 97% Pt in pain Ms: markedly reduced ROM lumbar spinr due to apin wit lower extremity weakness       Assessment & Plan:  Medicare annual wellness visit, initial Annual exam as documented. Counseling done  re healthy lifestyle involving commitment to 150 minutes exercise per week, heart healthy diet, and attaining healthy weight.The importance of adequate sleep also discussed. Regular seat belt use and home safety, is also discussed. Changes in health habits are decided on by the patient with goals and time frames  set for achieving them. Immunization and cancer screening needs are specifically addressed at this visit.   Back pain with radiation unconmtrolled , anti inflammatories in office and  Prescribed, also gabapentin added  Diabetes type 2, controlled Controlled, no change in medication Patient advised to reduce carb and sweets, commit to regular physical activity, take meds as prescribed, test blood as directed, and attempt to lose weight, to improve blood sugar control.

## 2014-02-20 ENCOUNTER — Encounter (HOSPITAL_COMMUNITY): Payer: Self-pay

## 2014-02-20 ENCOUNTER — Encounter (HOSPITAL_COMMUNITY): Payer: Medicare HMO | Attending: Hematology and Oncology

## 2014-02-20 VITALS — BP 145/67 | HR 62 | Temp 98.0°F | Resp 20 | Wt 204.7 lb

## 2014-02-20 DIAGNOSIS — Z170 Estrogen receptor positive status [ER+]: Secondary | ICD-10-CM

## 2014-02-20 DIAGNOSIS — C50912 Malignant neoplasm of unspecified site of left female breast: Secondary | ICD-10-CM | POA: Insufficient documentation

## 2014-02-20 LAB — COMPREHENSIVE METABOLIC PANEL
ALT: 19 U/L (ref 0–35)
AST: 14 U/L (ref 0–37)
Albumin: 3.7 g/dL (ref 3.5–5.2)
Alkaline Phosphatase: 78 U/L (ref 39–117)
Anion gap: 14 (ref 5–15)
BILIRUBIN TOTAL: 0.2 mg/dL — AB (ref 0.3–1.2)
BUN: 15 mg/dL (ref 6–23)
CHLORIDE: 100 meq/L (ref 96–112)
CO2: 25 mEq/L (ref 19–32)
CREATININE: 0.78 mg/dL (ref 0.50–1.10)
Calcium: 10.5 mg/dL (ref 8.4–10.5)
GFR calc Af Amer: >90 mL/min (ref >90)
GFR calc non Af Amer: 85 mL/min — ABNORMAL LOW (ref >90)
Glucose, Bld: 158 mg/dL — ABNORMAL HIGH (ref 70–99)
Potassium: 4.3 mEq/L (ref 3.7–5.3)
Sodium: 139 mEq/L (ref 137–147)
Total Protein: 8.2 g/dL (ref 6.0–8.3)

## 2014-02-20 LAB — CBC WITH DIFFERENTIAL/PLATELET
BASOS ABS: 0 10*3/uL (ref 0.0–0.1)
BASOS PCT: 0 % (ref 0–1)
Eosinophils Absolute: 0 10*3/uL (ref 0.0–0.7)
Eosinophils Relative: 0 % (ref 0–5)
HEMATOCRIT: 36.8 % (ref 36.0–46.0)
Hemoglobin: 12.1 g/dL (ref 12.0–15.0)
Lymphocytes Relative: 19 % (ref 12–46)
Lymphs Abs: 1.8 10*3/uL (ref 0.7–4.0)
MCH: 27.4 pg (ref 26.0–34.0)
MCHC: 32.9 g/dL (ref 30.0–36.0)
MCV: 83.4 fL (ref 78.0–100.0)
MONO ABS: 0.4 10*3/uL (ref 0.1–1.0)
Monocytes Relative: 5 % (ref 3–12)
NEUTROS ABS: 7.2 10*3/uL (ref 1.7–7.7)
NEUTROS PCT: 76 % (ref 43–77)
PLATELETS: 333 10*3/uL (ref 150–400)
RBC: 4.41 MIL/uL (ref 3.87–5.11)
RDW: 15.5 % (ref 11.5–15.5)
WBC: 9.5 10*3/uL (ref 4.0–10.5)

## 2014-02-20 NOTE — Progress Notes (Signed)
[facility]  OFFICE PROGRESS NOTE  Shannon Nakayama, MD [address], [address] [place] [address]  DIAGNOSIS: Breast cancer, left - Plan: CBC with Differential, Comprehensive metabolic panel, CEA, Cancer antigen 27.29, CBC with Differential, Comprehensive metabolic panel, CEA, Cancer antigen 27.29, CBC with Differential, Comprehensive metabolic panel, Cancer antigen 27.29, CEA, MM Digital Diagnostic Bilat, CANCELED: MM Digital Screening Unilat L  Chief Complaint  Patient presents with  . Breast Cancer    CURRENT THERAPY: anastrozole 1 mg daily, radiotherapy to left breast ending on 10/05/2013  INTERVAL HISTORY: Shannon Romero 68 y.o. female returns forfollowup of breast cancer status post lumpectomy and sentinel node biopsy on 06/07/2013, ER/PR positive, started on anastrozole in April 2015, status post radiotherapy completed on 10/05/2013. Hot flashes are well-controlled using Effexor XRT 75 mg at bedtime. Patient denies vaginal dryness but does have muscle aches and joint discomfort relieved by hydrocodone at bedtime only. She denies any cough, wheezing, diarrhea, constipation, dysuria, hematuria, incontinence, lower extremity swelling or redness, left upper extremity lymphedema, with occasional tenderness involving the lateral portion of the left breast and axilla. Appetite is good with no nausea, vomiting, abdominal distention, skin rash, headache, or seizures.  MEDICAL HISTORY: Past Medical History  Diagnosis Date  . Hypertension   . Diabetes mellitus   . GERD (gastroesophageal reflux disease)   . Bulging disc   . Breast cancer     INTERIM HISTORY: has Diabetes type 2, controlled; OBESITY; HYPERTENSION; GERD; Back pain with radiation; NECK PAIN; Chest pain, atypical; Flank pain; Cataracts, bilateral; Family history of colon cancer; Breast cancer; Encounter for home safety review for injury prevention; Encounter for annual physical  exam; Special screening for malignant neoplasms, colon; and Medicare annual wellness visit, initial on her problem list.    ALLERGIES:  is allergic to tramadol.  MEDICATIONS: has a current medication list which includes the following prescription(s): anastrozole, aspirin, calcium + d3, fish oil-omega-3 fatty acids, gabapentin, hydrocodone-acetaminophen, lisinopril-hydrochlorothiazide, metformin, multivitamin, prednisone, pyridoxine, venlafaxine xr, and UNABLE TO FIND, and the following Facility-Administered Medications: influenza (>/= 3 years) inactive virus vaccine.  SURGICAL HISTORY:  Past Surgical History  Procedure Laterality Date  . Tubal ligation    . Ovarian cyst removal    . Colonoscopy  03/26/2008    [facility]:VFMBBU rectum, scattered pancolonic diverticula and colonic/mucosa appeared normal, normal terminal ileum.  . Colonoscopy  02/28/2003    NUR: A few scattered small diverticula throughout the colon/ No evidence of colonic polyps or neoplasm/ Polyps or other lesions which potentially cause GI blood loss  . Colonoscopy N/A 04/21/2013    Procedure: COLONOSCOPY;  Surgeon: Daneil Dolin, MD;  Location: [facility];  Service: Endoscopy;  Laterality: N/A;  10:00  . Partial mastectomy with needle localization and axillary sentinel lymph node bx Left 06/07/2013    Procedure: PARTIAL MASTECTOMY WITH NEEDLE LOCALIZATION AND AXILLARY SENTINEL LYMPH NODE BX;  Surgeon: Jamesetta So, MD;  Location: [facility];  Service: General;  Laterality: Left;  . Re-excision of breast cancer,superior margins Left 06/21/2013    Procedure: RE-EXCISION OF BREAST CANCER;  Surgeon: Jamesetta So, MD;  Location: [facility];  Service: General;  Laterality: Left;    FAMILY HISTORY: family history includes Cancer in her brother; Colon cancer in her father; Diabetes in her brother, mother, and sister; Heart disease (age of onset: 38) in her sister; Heart disease (age of onset: 61) in her mother; Hypertension in her  sister.  SOCIAL HISTORY:  reports that she has quit smoking. She has never used smokeless tobacco. She reports that she does not drink alcohol or use illicit drugs.  REVIEW OF SYSTEMS:  Other than that discussed above is noncontributory.  PHYSICAL EXAMINATION: ECOG PERFORMANCE STATUS: 1 - Symptomatic but completely ambulatory  Blood pressure 145/67, pulse 62, temperature 98 F (36.7 C), resp. rate 20, weight 204 lb 11.2 oz (92.851 kg), SpO2 100 %.  GENERAL:alert, no distress and comfortable. Moderately obese. SKIN: skin color, texture, turgor are normal, no rashes or significant lesions EYES: PERLA; Conjunctiva are pink and non-injected, sclera clear SINUSES: No redness or tenderness over maxillary or ethmoid sinuses OROPHARYNX:no exudate, no erythema on lips, buccal mucosa, or tongue. NECK: supple, thyroid normal size, non-tender, without nodularity. No masses CHEST: status post left breast lumpectomy with hyperpigmentation changes of radiation. No masses in either breast. LYMPH:  no palpable lymphadenopathy in the cervical, axillary or inguinal LUNGS: clear to auscultation and percussion with normal breathing effort HEART: regular rate & rhythm and no murmurs. ABDOMEN:abdomen soft, non-tender and normal bowel sounds MUSCULOSKELETAL:no cyanosis of digits and no clubbing. Range of motion normal.  NEURO: alert & oriented x 3 with fluent speech, no focal motor/sensory deficits   LABORATORY DATA: Office Visit on 02/20/2014  Component Date Value Ref Range Status  . WBC 02/20/2014 9.5  4.0 - 10.5 K/uL Final  . RBC 02/20/2014 4.41  3.87 - 5.11 MIL/uL Final  . Hemoglobin 02/20/2014 12.1  12.0 - 15.0 g/dL Final  . HCT 02/20/2014 36.8  36.0 - 46.0 % Final  . MCV 02/20/2014 83.4  78.0 - 100.0 fL Final  . MCH 02/20/2014 27.4  26.0 - 34.0 pg Final  . MCHC 02/20/2014 32.9  30.0 - 36.0 g/dL Final  . RDW 02/20/2014 15.5  11.5 - 15.5 % Final  . Platelets 02/20/2014 333  150 - 400 K/uL Final  .  Neutrophils Relative % 02/20/2014 76  43 - 77 % Final  . Neutro Abs 02/20/2014 7.2  1.7 - 7.7 K/uL Final  . Lymphocytes Relative 02/20/2014 19  12 - 46 % Final  . Lymphs Abs 02/20/2014 1.8  0.7 - 4.0 K/uL Final  . Monocytes Relative 02/20/2014 5  3 - 12 % Final  . Monocytes Absolute 02/20/2014 0.4  0.1 - 1.0 K/uL Final  . Eosinophils Relative 02/20/2014 0  0 - 5 % Final  . Eosinophils Absolute 02/20/2014 0.0  0.0 - 0.7 K/uL Final  . Basophils Relative 02/20/2014 0  0 - 1 % Final  . Basophils Absolute 02/20/2014 0.0  0.0 - 0.1 K/uL Final    PATHOLOGY:no new pathology.  Urinalysis    Component Value Date/Time   BILIRUBINUR negative 09/27/2012 1331   PROTEINUR negative 09/27/2012 1331   UROBILINOGEN 0.2 09/27/2012 1331   NITRITE negative 09/27/2012 1331   LEUKOCYTESUR Negative 09/27/2012 1331    RADIOGRAPHIC STUDIES: No results found.  ASSESSMENT:  #1. 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  #2. Hypertension, controlled.  #3. Diabetes mellitus, type II, non-insulin requiring, controlled. #4. Vasomotor instability controlled with Effexor XR 75 mg at bedtime. #5. Transient hypercalcemia probably secondary to hydrochlorothiazide therapy     PLAN:  #1. Unilateral left mammogram in December 2015  to establish new baseline after radiotherapy.  #2. Continue anastrozole 1 mg daily and Effexor XR 75 mg at bedtime. #3. Continue self breast examination monthly. #4. Follow-up in 6 months with CBC, chem profile, [place].   All questions were answered. The patient knows to call the clinic with any problems, questions or concerns. We can certainly see the patient much sooner if  necessary.   I spent 25 minutes counseling the patient face to face. The total time spent in the appointment was 30 minutes.    Doroteo Bradford, MD 02/20/2014 11:21 AM  DISCLAIMER:  This note was dictated with voice recognition software.  Similar sounding words can inadvertently be transcribed inaccurately and may not be corrected upon review.

## 2014-02-20 NOTE — Patient Instructions (Signed)
[facility] Discharge Instructions  RECOMMENDATIONS MADE BY THE CONSULTANT AND ANY TEST RESULTS WILL BE SENT TO YOUR REFERRING PHYSICIAN.  EXAM FINDINGS BY THE PHYSICIAN TODAY AND SIGNS OR SYMPTOMS TO REPORT TO CLINIC OR PRIMARY PHYSICIAN: Exam and findings as discussed by   Unilateral left mammogram in December 2015 to establish new baseline after radiotherapy.   Continue anastrozole 1 mg daily and Effexor XR 75 mg at bedtime.  Continue self breast examination monthly.  Follow-up in 6 months with CBC, chem profile, CEA, [place] === Tuesday Aug 21, 2014 @ 9:10     Thank you for choosing [facility] to provide your oncology and hematology care.  To afford each patient quality time with our providers, please arrive at least 15 minutes before your scheduled appointment time.  With your help, our goal is to use those 15 minutes to complete the necessary work-up to ensure our physicians have the information they need to help with your evaluation and healthcare recommendations.    Effective January 1st, 2014, we ask that you re-schedule your appointment with our physicians should you arrive 10 or more minutes late for your appointment.  We strive to give you quality time with our providers, and arriving late affects you and other patients whose appointments are after yours.    Again, thank you for choosing [facility].  Our hope is that these requests will decrease the amount of time that you wait before being seen by our physicians.       _____________________________________________________________  Should you have questions after your visit to [facility], please contact our office at [phone] between the hours of 8:30 a.m. and 4:30 p.m.  Voicemails left after 4:30 p.m. will not be returned until the following business day.  For prescription refill requests, have your pharmacy contact our office with your prescription refill  request.    _______________________________________________________________  We hope that we have given you very good care.  You may receive a patient satisfaction survey in the mail, please complete it and return it as soon as possible.  We value your feedback!  _______________________________________________________________  Have you asked about our STAR program?  STAR stands for Survivorship Training and Rehabilitation, and this is a nationally recognized cancer care program that focuses on survivorship and rehabilitation.  Cancer and cancer treatments may cause problems, such as, pain, making you feel tired and keeping you from doing the things that you need or want to do. Cancer rehabilitation can help. Our goal is to reduce these troubling effects and help you have the best quality of life possible.  You may receive a survey from a nurse that asks questions about your current state of health.  Based on the survey results, all eligible patients will be referred to the [facility] program for an evaluation so we can better serve you!  A frequently asked questions sheet is available upon request.

## 2014-02-21 LAB — CANCER ANTIGEN 27.29: CA 27.29: 22 U/mL (ref 0–39)

## 2014-02-21 LAB — CEA: CEA: 1.2 ng/mL (ref 0.0–5.0)

## 2014-03-05 ENCOUNTER — Ambulatory Visit (INDEPENDENT_AMBULATORY_CARE_PROVIDER_SITE_OTHER): Payer: Medicare HMO | Admitting: Family Medicine

## 2014-03-05 ENCOUNTER — Encounter: Payer: Self-pay | Admitting: Family Medicine

## 2014-03-05 ENCOUNTER — Ambulatory Visit (HOSPITAL_COMMUNITY)
Admission: RE | Admit: 2014-03-05 | Discharge: 2014-03-05 | Disposition: A | Discharge status: Home / Self Care | Payer: Medicare HMO | Source: Ambulatory Visit | Attending: Family Medicine | Admitting: Family Medicine

## 2014-03-05 VITALS — BP 126/74 | HR 99 | Resp 18 | Ht 63.0 in | Wt 206.0 lb

## 2014-03-05 DIAGNOSIS — I10 Essential (primary) hypertension: Secondary | ICD-10-CM

## 2014-03-05 DIAGNOSIS — M25511 Pain in right shoulder: Secondary | ICD-10-CM | POA: Diagnosis not present

## 2014-03-05 DIAGNOSIS — M549 Dorsalgia, unspecified: Secondary | ICD-10-CM

## 2014-03-05 MED ORDER — PREDNISONE (PAK) 10 MG PO TABS: ORAL_TABLET | ORAL | Status: DC

## 2014-03-05 MED ORDER — METHYLPREDNISOLONE ACETATE 80 MG/ML IJ SUSP
120.0000 mg | Freq: Once | INTRAMUSCULAR | Status: AC
  Administered 2014-03-05: 120 mg via INTRAMUSCULAR

## 2014-03-05 MED ORDER — HYDROCODONE-ACETAMINOPHEN 5-325 MG PO TABS: ORAL_TABLET | ORAL | Status: DC

## 2014-03-05 MED ORDER — KETOROLAC TROMETHAMINE 60 MG/2ML IM SOLN
60.0000 mg | Freq: Once | INTRAMUSCULAR | Status: AC
  Administered 2014-03-05: 60 mg via INTRAMUSCULAR

## 2014-03-05 NOTE — Patient Instructions (Addendum)
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

## 2014-03-05 NOTE — Progress Notes (Signed)
   Subjective:    Patient ID: Shannon Romero, female    DOB: November 18, 1945, 68 y.o.   MRN: [id number]  HPI 10 day h/o right shoulder pain after reaching behind to get seat belt , also increased low back and right lower ext pain to foot after nearly tripping on [address] 2 Saturdays ago Also states that the gabapentin and hydrocodone were not controlling her pain before new additional injury Denies incontinence of stool or urine    Review of Systems     Objective:   Physical Exam BP 126/74 mmHg  Pulse 99  Resp 18  Ht 5\' 3"  (1.6 m)  Wt 206 lb (93.441 kg)  BMI 36.50 kg/m2  SpO2 98% Patient alert and oriented and in no cardiopulmonary distress.  HEENT: No facial asymmetry, EOMI,   oropharynx pink and moist.  Neck decreased though adequate ROM no JVD, no mass.  Chest: Clear to auscultation bilaterally.No reproducible chest wall tenderness  CVS: S1, S2 no murmurs, no S3.Regular rate.  ABD: Soft non tender.   Ext: No edema  MS: decreased  ROM lumbar  Spine, right  shoulder, hips and knees.    CNS: CN 2-12 intact, decreased power and sensation in RLE,.        Assessment & Plan:  Back pain with radiation Increased and uncontrolled following trauma approx 11 days ago,  Now radiates from low back to right foot with numbness in the foot Pt also states the gabapentin and one pain med is not helping her pain much, dose of hydrocodone increased to two daily starting today  Shoulder pain, right Acute right shoulder pain following injury while reaching back to get seat belt 11 days ago, xray of shoulder and anti inflammatories may need PT also as mobility is significantly limited currently, pt to call back  Essential hypertension Controlled, no change in medication DASH diet and commitment to daily physical activity for a minimum of 30 minutes discussed and encouraged, as a part of hypertension management. The importance of attaining a healthy weight is also discussed.

## 2014-03-05 NOTE — Assessment & Plan Note (Signed)
Increased and uncontrolled following trauma approx 11 days ago,  Now radiates from low back to right foot with numbness in the foot Pt also states the gabapentin and one pain med is not helping her pain much, dose of hydrocodone increased to two daily starting today

## 2014-03-05 NOTE — Assessment & Plan Note (Signed)
Acute right shoulder pain following injury while reaching back to get seat belt 11 days ago, xray of shoulder and anti inflammatories may need PT also as mobility is significantly limited currently, pt to call back

## 2014-03-05 NOTE — Assessment & Plan Note (Signed)
Controlled, no change in medication DASH diet and commitment to daily physical activity for a minimum of 30 minutes discussed and encouraged, as a part of hypertension management. The importance of attaining a healthy weight is also discussed.  

## 2014-03-15 ENCOUNTER — Other Ambulatory Visit: Payer: Self-pay

## 2014-03-15 DIAGNOSIS — M549 Dorsalgia, unspecified: Secondary | ICD-10-CM

## 2014-03-27 ENCOUNTER — Ambulatory Visit (HOSPITAL_COMMUNITY)
Admission: RE | Admit: 2014-03-27 | Discharge: 2014-03-27 | Disposition: A | Discharge status: Home / Self Care | Payer: Medicare HMO | Source: Ambulatory Visit | Attending: Oncology | Admitting: Oncology

## 2014-03-27 DIAGNOSIS — C50912 Malignant neoplasm of unspecified site of left female breast: Secondary | ICD-10-CM

## 2014-03-27 DIAGNOSIS — Z853 Personal history of malignant neoplasm of breast: Secondary | ICD-10-CM | POA: Diagnosis not present

## 2014-03-27 DIAGNOSIS — Z1231 Encounter for screening mammogram for malignant neoplasm of breast: Secondary | ICD-10-CM | POA: Insufficient documentation

## 2014-03-30 ENCOUNTER — Other Ambulatory Visit: Payer: Self-pay

## 2014-03-30 DIAGNOSIS — M549 Dorsalgia, unspecified: Secondary | ICD-10-CM

## 2014-03-30 MED ORDER — HYDROCODONE-ACETAMINOPHEN 5-325 MG PO TABS: ORAL_TABLET | ORAL | Status: DC

## 2014-04-05 IMAGING — CR DG CHEST 2V
2 series · 2 of 2 positions shown · non-contrast
Comparison: None.

CLINICAL DATA: History of left breast carcinoma

EXAM:
CHEST  2 VIEW

[view not recorded (1 of 2)]
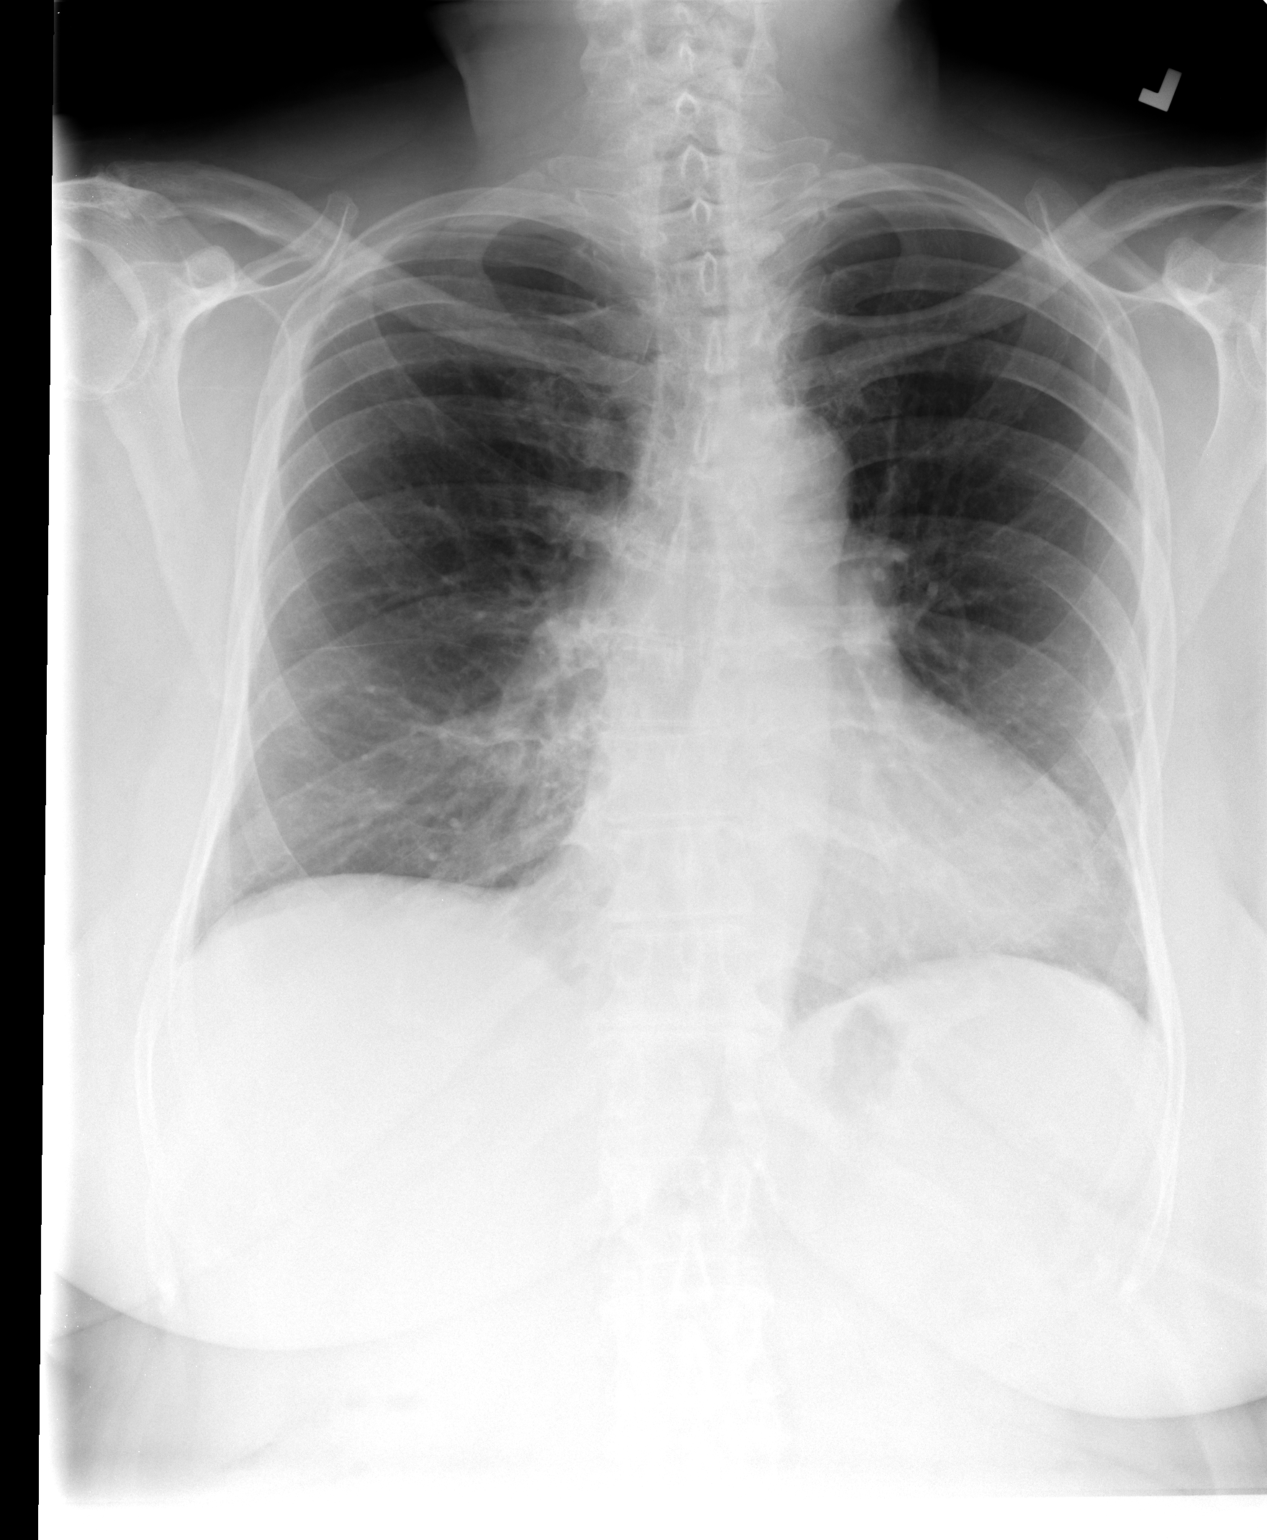

[view not recorded (2 of 2)]
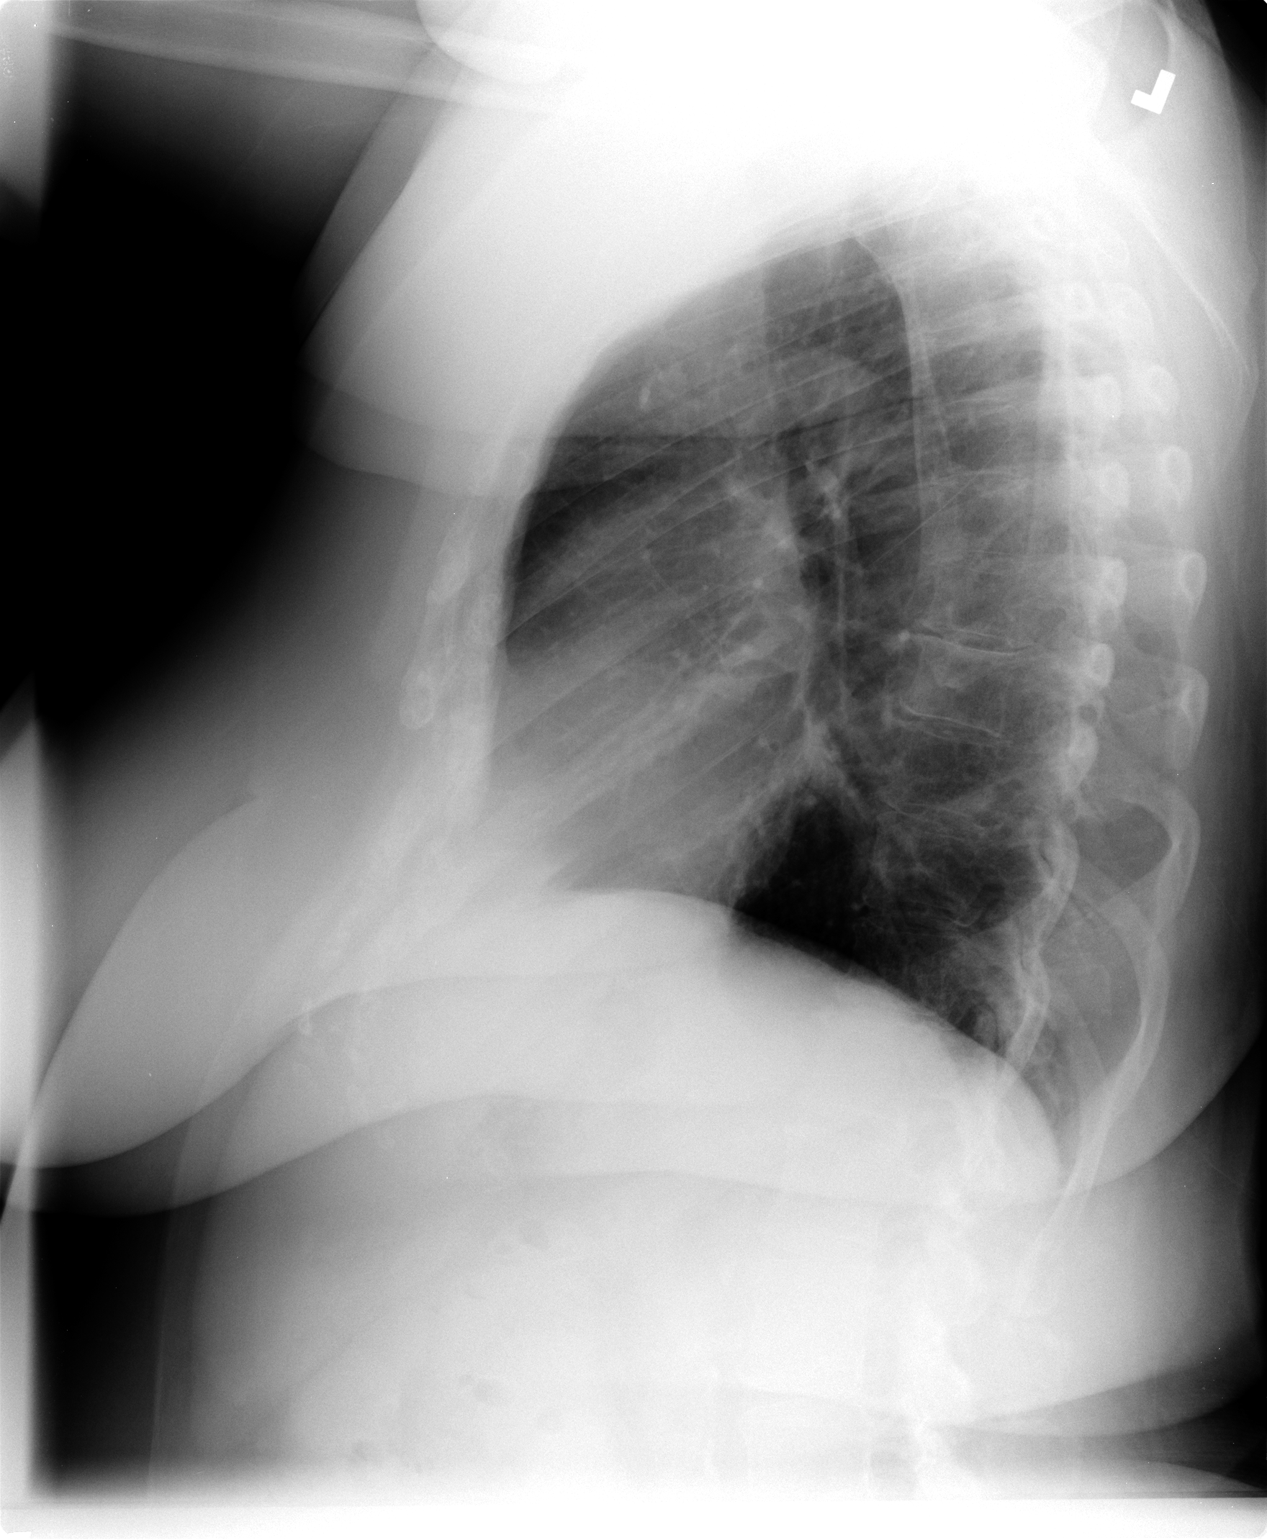

[2 of 2 positions shown; findings below may reference images not displayed]

FINDINGS: No active infiltrate or effusion is seen. Mediastinal contours
appear normal. The heart is within upper limits of normal. Mild
degenerative changes present in the lower thoracic spine.
IMPRESSION: No active lung disease.

## 2014-04-23 DIAGNOSIS — Z0000 Encounter for general adult medical examination without abnormal findings: Secondary | ICD-10-CM | POA: Insufficient documentation

## 2014-04-23 NOTE — Assessment & Plan Note (Signed)
Controlled, no change in medication Patient advised to reduce carb and sweets, commit to regular physical activity, take meds as prescribed, test blood as directed, and attempt to lose weight, to improve blood sugar control.  

## 2014-04-23 NOTE — Assessment & Plan Note (Addendum)
unconmtrolled , anti inflammatories in office and  Prescribed, also gabapentin added

## 2014-04-27 ENCOUNTER — Other Ambulatory Visit: Payer: Self-pay

## 2014-04-27 ENCOUNTER — Other Ambulatory Visit: Payer: Self-pay | Admitting: Family Medicine

## 2014-04-27 DIAGNOSIS — M549 Dorsalgia, unspecified: Secondary | ICD-10-CM

## 2014-04-27 MED ORDER — HYDROCODONE-ACETAMINOPHEN 5-325 MG PO TABS: ORAL_TABLET | ORAL | Status: DC

## 2014-06-04 ENCOUNTER — Other Ambulatory Visit (HOSPITAL_COMMUNITY): Payer: Self-pay | Admitting: Hematology and Oncology

## 2014-06-11 ENCOUNTER — Other Ambulatory Visit (HOSPITAL_COMMUNITY): Payer: Self-pay | Admitting: Oncology

## 2014-06-11 DIAGNOSIS — C50912 Malignant neoplasm of unspecified site of left female breast: Secondary | ICD-10-CM

## 2014-06-11 MED ORDER — ANASTROZOLE 1 MG PO TABS: 1.0000 mg | ORAL_TABLET | Freq: Every day | ORAL | Status: DC

## 2014-06-15 ENCOUNTER — Other Ambulatory Visit: Payer: Self-pay | Admitting: Family Medicine

## 2014-06-15 DIAGNOSIS — E790 Hyperuricemia without signs of inflammatory arthritis and tophaceous disease: Secondary | ICD-10-CM

## 2014-06-15 LAB — LIPID PANEL
CHOLESTEROL: 180 mg/dL (ref 0–200)
HDL: 64 mg/dL (ref >=46)
LDL CALC: 101 mg/dL — AB (ref 0–99)
TRIGLYCERIDES: 75 mg/dL (ref <150)
Total CHOL/HDL Ratio: 2.8 Ratio
VLDL: 15 mg/dL (ref 0–40)

## 2014-06-15 LAB — COMPLETE METABOLIC PANEL WITH GFR
ALBUMIN: 4.4 g/dL (ref 3.5–5.2)
ALK PHOS: 78 U/L (ref 39–117)
ALT: 20 U/L (ref 0–35)
AST: 16 U/L (ref 0–37)
BUN: 13 mg/dL (ref 6–23)
CALCIUM: 11.1 mg/dL — AB (ref 8.4–10.5)
CO2: 27 meq/L (ref 19–32)
Chloride: 102 mEq/L (ref 96–112)
Creat: 0.92 mg/dL (ref 0.50–1.10)
GFR, EST NON AFRICAN AMERICAN: 65 mL/min
GFR, Est African American: 75 mL/min
Glucose, Bld: 146 mg/dL — ABNORMAL HIGH (ref 70–99)
POTASSIUM: 4.2 meq/L (ref 3.5–5.3)
SODIUM: 143 meq/L (ref 135–145)
TOTAL PROTEIN: 8 g/dL (ref 6.0–8.3)
Total Bilirubin: 0.4 mg/dL (ref 0.2–1.2)

## 2014-06-15 LAB — HEMOGLOBIN A1C
HEMOGLOBIN A1C: 6.9 % — AB (ref <5.7)
Mean Plasma Glucose: 151 mg/dL — ABNORMAL HIGH (ref <117)

## 2014-06-20 ENCOUNTER — Ambulatory Visit (INDEPENDENT_AMBULATORY_CARE_PROVIDER_SITE_OTHER): Payer: PPO | Admitting: Family Medicine

## 2014-06-20 ENCOUNTER — Encounter: Payer: Self-pay | Admitting: Family Medicine

## 2014-06-20 VITALS — BP 118/74 | HR 96 | Resp 16 | Ht 63.0 in | Wt 214.0 lb

## 2014-06-20 DIAGNOSIS — Z23 Encounter for immunization: Secondary | ICD-10-CM

## 2014-06-20 DIAGNOSIS — E785 Hyperlipidemia, unspecified: Secondary | ICD-10-CM

## 2014-06-20 DIAGNOSIS — I10 Essential (primary) hypertension: Secondary | ICD-10-CM

## 2014-06-20 DIAGNOSIS — M25511 Pain in right shoulder: Secondary | ICD-10-CM

## 2014-06-20 DIAGNOSIS — E119 Type 2 diabetes mellitus without complications: Secondary | ICD-10-CM

## 2014-06-20 DIAGNOSIS — M542 Cervicalgia: Secondary | ICD-10-CM

## 2014-06-20 DIAGNOSIS — M549 Dorsalgia, unspecified: Secondary | ICD-10-CM

## 2014-06-20 DIAGNOSIS — E669 Obesity, unspecified: Secondary | ICD-10-CM

## 2014-06-20 LAB — URIC ACID: Uric Acid, Serum: 8.7 mg/dL — ABNORMAL HIGH (ref 2.4–7.0)

## 2014-06-20 MED ORDER — METHYLPREDNISOLONE ACETATE 80 MG/ML IJ SUSP
80.0000 mg | Freq: Once | INTRAMUSCULAR | Status: AC
  Administered 2014-06-20: 80 mg via INTRAMUSCULAR

## 2014-06-20 MED ORDER — IBUPROFEN 800 MG PO TABS: ORAL_TABLET | ORAL | Status: DC

## 2014-06-20 MED ORDER — KETOROLAC TROMETHAMINE 60 MG/2ML IM SOLN
60.0000 mg | Freq: Once | INTRAMUSCULAR | Status: AC
  Administered 2014-06-20: 60 mg via INTRAMUSCULAR

## 2014-06-20 MED ORDER — GABAPENTIN 300 MG PO CAPS: 300.0000 mg | ORAL_CAPSULE | Freq: Three times a day (TID) | ORAL | Status: DC

## 2014-06-20 MED ORDER — METFORMIN HCL 1000 MG PO TABS: 1000.0000 mg | ORAL_TABLET | Freq: Two times a day (BID) | ORAL | Status: DC

## 2014-06-20 MED ORDER — LISINOPRIL-HYDROCHLOROTHIAZIDE 20-25 MG PO TABS: ORAL_TABLET | ORAL | Status: DC

## 2014-06-20 MED ORDER — ATORVASTATIN CALCIUM 10 MG PO TABS
10.0000 mg | ORAL_TABLET | Freq: Every day | ORAL | Status: DC
Start: 2014-06-20 — End: 2014-09-06

## 2014-06-20 MED ORDER — PREDNISONE (PAK) 5 MG PO TABS: 5.0000 mg | ORAL_TABLET | ORAL | Status: DC

## 2014-06-20 NOTE — Patient Instructions (Signed)
Annual physical exam in 4 month, callif you need me before  Prevnar today  Injections today for neck , shoulder and back pain , also ibuprofen and prednisone sent. If shoulder pain continues call for referral to Dr Aline Brochure  INCREASE gabapentin to THREE TIMES DAILY, 6am , 2pm and 10pm  HBa1C, cmp and EGFR non fast in 4 month  NEW MED for cholesterol is lipitor 76m one at bedtime

## 2014-06-23 DIAGNOSIS — Z23 Encounter for immunization: Secondary | ICD-10-CM | POA: Insufficient documentation

## 2014-06-23 DIAGNOSIS — E785 Hyperlipidemia, unspecified: Secondary | ICD-10-CM | POA: Insufficient documentation

## 2014-06-23 NOTE — Assessment & Plan Note (Signed)
Controlled, no change in medication DASH diet and commitment to daily physical activity for a minimum of 30 minutes discussed and encouraged, as a part of hypertension management. The importance of attaining a healthy weight is also discussed.  

## 2014-06-23 NOTE — Assessment & Plan Note (Signed)
Deteriorated, will try anti inflammatories, pt to call for ortho eval if no significant improvement in 2 weeks , currently has marked limitation in movement of affected shoulder

## 2014-06-23 NOTE — Assessment & Plan Note (Signed)
After obtaining informed consent, the vaccine is  administered by LPN.  

## 2014-06-23 NOTE — Progress Notes (Signed)
Subjective:    Patient ID: Dimitri Ped, female    DOB: 07-Dec-1945, 69 y.o.   MRN: [id number]  HPI The PT is here for follow up and re-evaluation of chronic medical conditions, medication management and review of any available recent lab and radiology data.  Preventive health is updated, specifically  Cancer screening and Immunization.   Questions or concerns regarding consultations or procedures which the PT has had in the interim are  addressed. The PT denies any adverse reactions to current medications since the last visit.  1 mont h/o increased right neck and shoulder pain , uncontrolled wit limitatio of houlder movement No associated trauma Denies polyuria, polydipsia, blurred vision , or hypoglycemic episodes.    Review of Systems See HPI Denies recent fever or chills. Denies sinus pressure, nasal congestion, ear pain or sore throat. Denies chest congestion, productive cough or wheezing. Denies chest pains, palpitations and leg swelling Denies abdominal pain, nausea, vomiting,diarrhea or constipation.   Denies dysuria, frequency, hesitancy or incontinence.  Denies headaches, seizures, numbness, or tingling. Denies depression, anxiety or insomnia. Denies skin break down or rash.        Objective:   Physical Exam BP 118/74 mmHg  Pulse 96  Resp 16  Ht 5\' 3"  (1.6 m)  Wt 214 lb (97.07 kg)  BMI 37.92 kg/m2  SpO2 96% Patient alert and oriented and in no cardiopulmonary distress.  HEENT: No facial asymmetry, EOMI,   oropharynx pink and moist.  Neck decreased though adequate ROM, no JVD, no mass.  Chest: Clear to auscultation bilaterally.  CVS: S1, S2 no murmurs, no S3.Regular rate.  ABD: Soft non tender.   Ext: No edema  MS: Reduced ROM lumbar spine, decreased right shoulder mobility , normal in, hips and knees.  Skin: Intact, no ulcerations or rash noted.  Psych: Good eye contact, normal affect. Memory intact not anxious or depressed appearing.  CNS: CN  2-12 intact, power,  normal throughout.no focal deficits noted.        Assessment & Plan:  NECK PAIN Acute flare radiating to right shoulder Anti inflammatories in office IM to be followed by short oral course, pt likely has arthritis and disc disease affecting c spine Increase gabapentin dose   Shoulder pain, right Deteriorated, will try anti inflammatories, pt to call for ortho eval if no significant improvement in 2 weeks , currently has marked limitation in movement of affected shoulder   Need for vaccination with 13-polyvalent pneumococcal conjugate vaccine After obtaining informed consent, the vaccine is  administered by LPN    Essential hypertension Controlled, no change in medication DASH diet and commitment to daily physical activity for a minimum of 30 minutes discussed and encouraged, as a part of hypertension management. The importance of attaining a healthy weight is also discussed.    Obesity Unchanged Patient re-educated about  the importance of commitment to a  minimum of 150 minutes of exercise per week. The importance of healthy food choices with portion control discussed. Encouraged to start a food diary, count calories and to consider  joining a support group. Sample diet sheets offered. Goals set by the patient for the next several months.      Diabetes type 2, controlled Controlled, no change in medication Patient advised to reduce carb and sweets, commit to regular physical activity, take meds as prescribed, test blood as directed, and attempt to lose weight, to improve blood sugar control.    Hyperlipidemia LDL goal <100 Not at goal lipoma Updated  lab needed at/ before next visit.

## 2014-06-23 NOTE — Assessment & Plan Note (Signed)
Acute flare radiating to right shoulder Anti inflammatories in office IM to be followed by short oral course, pt likely has arthritis and disc disease affecting c spine Increase gabapentin dose

## 2014-06-23 NOTE — Assessment & Plan Note (Signed)
Controlled, no change in medication Patient advised to reduce carb and sweets, commit to regular physical activity, take meds as prescribed, test blood as directed, and attempt to lose weight, to improve blood sugar control.  

## 2014-06-23 NOTE — Assessment & Plan Note (Signed)
Not at goal lipoma Updated lab needed at/ before next visit.

## 2014-06-23 NOTE — Assessment & Plan Note (Signed)
Unchanged. Patient re-educated about  the importance of commitment to a  minimum of 150 minutes of exercise per week. The importance of healthy food choices with portion control discussed. Encouraged to start a food diary, count calories and to consider  joining a support group. Sample diet sheets offered. Goals set by the patient for the next several months.    

## 2014-07-03 LAB — HM DIABETES EYE EXAM

## 2014-07-03 MED ORDER — ALLOPURINOL 300 MG PO TABS: 300.0000 mg | ORAL_TABLET | Freq: Every day | ORAL | Status: DC

## 2014-07-03 NOTE — Addendum Note (Signed)
Addended by: Eual Fines on: 07/03/2014 12:56 PM   Modules accepted: Orders

## 2014-07-16 ENCOUNTER — Telehealth: Payer: Self-pay

## 2014-07-16 DIAGNOSIS — M25511 Pain in right shoulder: Secondary | ICD-10-CM

## 2014-07-16 NOTE — Telephone Encounter (Signed)
pls refer her to ortho of her choice eval and treat shoulder pain with arthritis, she has xray of affected shoulder done 02/2014 may be sent with referral, I will sign

## 2014-07-18 NOTE — Telephone Encounter (Signed)
Patient would like to stay local.   Referral entered.   States that she would like to go to [place].

## 2014-07-18 NOTE — Addendum Note (Signed)
Addended by: Denman George B on: 07/18/2014 09:16 AM   Modules accepted: Orders

## 2014-08-09 ENCOUNTER — Ambulatory Visit (INDEPENDENT_AMBULATORY_CARE_PROVIDER_SITE_OTHER): Payer: PPO | Admitting: Family Medicine

## 2014-08-09 ENCOUNTER — Encounter: Payer: Self-pay | Admitting: Family Medicine

## 2014-08-09 VITALS — BP 104/62 | HR 93 | Resp 18 | Wt 211.0 lb

## 2014-08-09 DIAGNOSIS — M549 Dorsalgia, unspecified: Secondary | ICD-10-CM

## 2014-08-09 DIAGNOSIS — I10 Essential (primary) hypertension: Secondary | ICD-10-CM | POA: Diagnosis not present

## 2014-08-09 MED ORDER — HYDROCODONE-ACETAMINOPHEN 5-325 MG PO TABS: ORAL_TABLET | ORAL | Status: DC

## 2014-08-09 MED ORDER — GABAPENTIN 300 MG PO CAPS: ORAL_CAPSULE | ORAL | Status: DC

## 2014-08-09 NOTE — Assessment & Plan Note (Signed)
1 week flare of uncontrolled pain, radiating to left leg Needs increased dose of the gabapentin, and start daily hydrocodone Epidural injection

## 2014-08-09 NOTE — Patient Instructions (Signed)
F/u in 4 weeks, call if you need me before  INCREASE gabapentin to four daily , take two at bedtime  New is hydrocodone one daily  You are referred for asap epidural injection  Thanks for choosing [facility], we consider it a privelige to serve you.

## 2014-08-13 ENCOUNTER — Other Ambulatory Visit: Payer: Self-pay | Admitting: Family Medicine

## 2014-08-13 DIAGNOSIS — M79605 Pain in left leg: Secondary | ICD-10-CM

## 2014-08-13 DIAGNOSIS — M48061 Spinal stenosis, lumbar region without neurogenic claudication: Secondary | ICD-10-CM

## 2014-08-13 DIAGNOSIS — M5136 Other intervertebral disc degeneration, lumbar region: Secondary | ICD-10-CM

## 2014-08-16 ENCOUNTER — Ambulatory Visit: Payer: PPO | Admitting: Family Medicine

## 2014-08-16 ENCOUNTER — Ambulatory Visit
Admission: RE | Admit: 2014-08-16 | Discharge: 2014-08-16 | Disposition: A | Discharge status: Home / Self Care | Payer: PPO | Source: Ambulatory Visit | Attending: Family Medicine | Admitting: Family Medicine

## 2014-08-16 ENCOUNTER — Other Ambulatory Visit: Payer: PPO

## 2014-08-16 DIAGNOSIS — M48061 Spinal stenosis, lumbar region without neurogenic claudication: Secondary | ICD-10-CM

## 2014-08-16 DIAGNOSIS — M5136 Other intervertebral disc degeneration, lumbar region: Secondary | ICD-10-CM

## 2014-08-16 DIAGNOSIS — M79605 Pain in left leg: Secondary | ICD-10-CM

## 2014-08-16 MED ORDER — IOHEXOL 180 MG/ML  SOLN
1.0000 mL | Freq: Once | INTRAMUSCULAR | Status: AC | PRN
  Administered 2014-08-16: 1 mL via EPIDURAL

## 2014-08-16 MED ORDER — METHYLPREDNISOLONE ACETATE 40 MG/ML INJ SUSP (RADIOLOG
120.0000 mg | Freq: Once | INTRAMUSCULAR | Status: AC
  Administered 2014-08-16: 120 mg via EPIDURAL

## 2014-08-16 NOTE — Discharge Instructions (Signed)

## 2014-08-21 ENCOUNTER — Telehealth (HOSPITAL_COMMUNITY): Payer: Self-pay

## 2014-08-21 ENCOUNTER — Encounter (HOSPITAL_COMMUNITY): Payer: PPO | Attending: Hematology & Oncology | Admitting: Hematology & Oncology

## 2014-08-21 ENCOUNTER — Encounter (HOSPITAL_BASED_OUTPATIENT_CLINIC_OR_DEPARTMENT_OTHER): Payer: PPO

## 2014-08-21 ENCOUNTER — Encounter (HOSPITAL_COMMUNITY): Payer: Self-pay | Admitting: Hematology & Oncology

## 2014-08-21 ENCOUNTER — Other Ambulatory Visit (HOSPITAL_COMMUNITY): Payer: Self-pay | Admitting: Emergency Medicine

## 2014-08-21 VITALS — BP 118/69 | HR 59 | Temp 98.3°F | Resp 16 | Wt 207.0 lb

## 2014-08-21 DIAGNOSIS — C50919 Malignant neoplasm of unspecified site of unspecified female breast: Secondary | ICD-10-CM | POA: Diagnosis not present

## 2014-08-21 DIAGNOSIS — Z853 Personal history of malignant neoplasm of breast: Secondary | ICD-10-CM

## 2014-08-21 DIAGNOSIS — R945 Abnormal results of liver function studies: Secondary | ICD-10-CM | POA: Diagnosis not present

## 2014-08-21 DIAGNOSIS — Z79811 Long term (current) use of aromatase inhibitors: Secondary | ICD-10-CM

## 2014-08-21 LAB — COMPREHENSIVE METABOLIC PANEL
ALT: 338 U/L — AB (ref 14–54)
AST: 94 U/L — ABNORMAL HIGH (ref 15–41)
Albumin: 3.7 g/dL (ref 3.5–5.0)
Alkaline Phosphatase: 72 U/L (ref 38–126)
Anion gap: 8 (ref 5–15)
BILIRUBIN TOTAL: 0.4 mg/dL (ref 0.3–1.2)
BUN: 24 mg/dL — ABNORMAL HIGH (ref 6–20)
CHLORIDE: 103 mmol/L (ref 101–111)
CO2: 27 mmol/L (ref 22–32)
Calcium: 10.3 mg/dL (ref 8.9–10.3)
Creatinine, Ser: 0.94 mg/dL (ref 0.44–1.00)
GFR calc Af Amer: >60 mL/min (ref >60)
Glucose, Bld: 100 mg/dL — ABNORMAL HIGH (ref 70–99)
POTASSIUM: 4.7 mmol/L (ref 3.5–5.1)
Sodium: 138 mmol/L (ref 135–145)
Total Protein: 7.8 g/dL (ref 6.5–8.1)

## 2014-08-21 LAB — CBC WITH DIFFERENTIAL/PLATELET
Basophils Absolute: 0.1 10*3/uL (ref 0.0–0.1)
Basophils Relative: 0 % (ref 0–1)
EOS ABS: 0.3 10*3/uL (ref 0.0–0.7)
Eosinophils Relative: 3 % (ref 0–5)
HCT: 36.2 % (ref 36.0–46.0)
Hemoglobin: 11.6 g/dL — ABNORMAL LOW (ref 12.0–15.0)
LYMPHS ABS: 2.8 10*3/uL (ref 0.7–4.0)
LYMPHS PCT: 24 % (ref 12–46)
MCH: 27 pg (ref 26.0–34.0)
MCHC: 32 g/dL (ref 30.0–36.0)
MCV: 84.4 fL (ref 78.0–100.0)
Monocytes Absolute: 0.7 10*3/uL (ref 0.1–1.0)
Monocytes Relative: 6 % (ref 3–12)
NEUTROS ABS: 7.7 10*3/uL (ref 1.7–7.7)
NEUTROS PCT: 67 % (ref 43–77)
PLATELETS: 393 10*3/uL (ref 150–400)
RBC: 4.29 MIL/uL (ref 3.87–5.11)
RDW: 16.4 % — ABNORMAL HIGH (ref 11.5–15.5)
WBC: 11.6 10*3/uL — AB (ref 4.0–10.5)

## 2014-08-21 NOTE — Progress Notes (Signed)
Shannon Nakayama, MD 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    Bone density: 01/23/13 normal Intermittent elevation in calcium, normal PTH  Hot flashes controlled on Effexor XR   SUMMARY OF ONCOLOGIC HISTORY:   Breast cancer   07/02/2013 Initial Diagnosis Breast cancer   08/21/2013 - 10/05/2013 Radiation Therapy Dr. Pablo Ledger.  45 Gy at 1.8 Gy/fraction x 25 fractions and 16 Gy at 2 Gy/fraction x 8 fractions    CURRENT THERAPY:  INTERVAL HISTORY: Shannon Romero 69 y.o. female returns for routine follow up of a previously diagnosed ER+ breast cancer.   She doing well on her treatment and takes her medication daily. She is on Arimidex 1 mg. She's on Effexor and denies hot flashes. She gets up to go to the bathroom, but otherwise sleeps well.. She has a good appetite and denies any bowel issues. She has had a colonoscopy.  MEDICAL HISTORY: Past Medical History  Diagnosis Date  . Hypertension   . Diabetes mellitus   . GERD (gastroesophageal reflux disease)   . Bulging disc   . Breast cancer     has Diabetes type 2, controlled; Obesity; Essential hypertension; GERD; Back pain with radiation; NECK PAIN; Cataracts, bilateral; Family history of colon cancer; Breast cancer; Shoulder pain, right; Need for vaccination with 13-polyvalent pneumococcal conjugate vaccine; and Hyperlipidemia LDL goal <100 on her problem list.     is allergic to tramadol.  Current Outpatient Prescriptions on File Prior to Visit  Medication Sig Dispense Refill  . allopurinol (ZYLOPRIM) 300 MG tablet Take 1 tablet (300 mg total) by mouth daily. 30 tablet 4  . anastrozole  (ARIMIDEX) 1 MG tablet Take 1 tablet (1 mg total) by mouth daily. 90 tablet 3  . aspirin 81 MG tablet Take 81 mg by mouth daily.      Marland Kitchen atorvastatin (LIPITOR) 10 MG tablet Take 1 tablet (10 mg total) by mouth daily. 30 tablet 5  . Calcium Carb-Cholecalciferol (CALCIUM + D3) 600-200 MG-UNIT TABS Take 1 tablet by mouth daily.     . cyanocobalamin 100 MCG tablet Take 100 mcg by mouth daily.    . fish oil-omega-3 fatty acids 1000 MG capsule Take 1 g by mouth daily.      Marland Kitchen gabapentin (NEURONTIN) 300 MG capsule One capsule twice daily and two at bedtime 120 capsule 3  . HYDROcodone-acetaminophen (NORCO/VICODIN) 5-325 MG per tablet One tablet once daily for uncontrolled pain 30 tablet 0  . ibuprofen (ADVIL,MOTRIN) 800 MG tablet One tablet twice daily for 1 week  only 14 tablet 0  . lisinopril-hydrochlorothiazide (PRINZIDE,ZESTORETIC) 20-25 MG per tablet TAKE ONE TABLET BY MOUTH ONCE DAILY 90 tablet 1  . metFORMIN (GLUCOPHAGE) 1000 MG tablet Take 1 tablet (1,000 mg total) by mouth 2 (two) times daily with a meal. 180 tablet 1  . Multiple Vitamin (MULTIVITAMIN) capsule Take 1 capsule by mouth daily.    Marland Kitchen pyridoxine (B-6) 100 MG tablet TWO TABS BY MOUTH DAILY     . venlafaxine XR (EFFEXOR-XR) 75 MG 24 hr capsule Take one capsule at that time to control hot flashes. 90 capsule 3  . UNABLE TO FIND Equate Arthritis pills bid  Current Facility-Administered Medications on File Prior to Visit  Medication Dose Route Frequency Provider Last Rate Last Dose  . Influenza (>/= 3 years) inactive virus vaccine (FLVIRIN/FLUZONE) injection SUSP 0.5 mL  0.5 mL Intramuscular Once Fayrene Helper, MD         SURGICAL HISTORY: Past Surgical History  Procedure Laterality Date  . Tubal ligation    . Ovarian cyst removal    . Colonoscopy  03/26/2008    KXF:GHWEXH rectum, scattered pancolonic diverticula and colonic/mucosa appeared normal, normal terminal ileum.  . Colonoscopy  02/28/2003    NUR: A few scattered  small diverticula throughout the colon/ No evidence of colonic polyps or neoplasm/ Polyps or other lesions which potentially cause GI blood loss  . Colonoscopy N/A 04/21/2013    Procedure: COLONOSCOPY;  Surgeon: Daneil Dolin, MD;  Location: [facility];  Service: Endoscopy;  Laterality: N/A;  10:00  . Partial mastectomy with needle localization and axillary sentinel lymph node bx Left 06/07/2013    Procedure: PARTIAL MASTECTOMY WITH NEEDLE LOCALIZATION AND AXILLARY SENTINEL LYMPH NODE BX;  Surgeon: Jamesetta So, MD;  Location: [facility];  Service: General;  Laterality: Left;  . Re-excision of breast cancer,superior margins Left 06/21/2013    Procedure: RE-EXCISION OF BREAST CANCER;  Surgeon: Jamesetta So, MD;  Location: [facility];  Service: General;  Laterality: Left;    SOCIAL HISTORY: History   Social History  . Marital Status: Married    Spouse Name: N/A  . Number of Children: N/A  . Years of Education: N/A   Occupational History  . Not on file.   Social History Main Topics  . Smoking status: Former Research scientist (life sciences)  . Smokeless tobacco: Never Used  . Alcohol Use: No  . Drug Use: No  . Sexual Activity: No   Other Topics Concern  . Not on file   Social History Narrative  She likes to read mystery novels. She been married for 47 years.  FAMILY HISTORY: Family History  Problem Relation Age of Onset  . Diabetes Mother   . Heart disease Mother 44  . Colon cancer Father     Diagnosed at age 14, deceased at 37  . Diabetes Sister     X 5  . Hypertension Sister     X 7  . Heart disease Sister 51    X 2  . Diabetes Brother     X 1  . Cancer Brother     LUNG BRAIN AND THROAT  No one else in her family has breast cancer  Review of Systems  Constitutional: Negative for fever, chills, weight loss and malaise/fatigue.  HENT: Negative for congestion, hearing loss, nosebleeds, sore throat and tinnitus.   Eyes: Negative for blurred vision, double vision, pain and discharge.    Respiratory: Negative for cough, hemoptysis, sputum production, shortness of breath and wheezing.   Cardiovascular: Negative for chest pain, palpitations, claudication, leg swelling and PND.  Gastrointestinal: Negative for heartburn, nausea, vomiting, abdominal pain, diarrhea, constipation, blood in stool and melena.  Genitourinary: Negative for dysuria, urgency, frequency and hematuria.  Musculoskeletal: Negative for myalgias, joint pain and falls.  Skin: Negative for itching and rash.  Neurological: Negative for dizziness, tingling, tremors, sensory change, speech change, focal weakness, seizures, loss of consciousness, weakness and headaches.  Endo/Heme/Allergies: Does not bruise/bleed easily.  Psychiatric/Behavioral: Negative for depression, suicidal ideas, memory loss and substance abuse. The patient is not nervous/anxious and does not have insomnia.     PHYSICAL EXAMINATION  ECOG PERFORMANCE STATUS: 0 - Asymptomatic  Filed Vitals:   08/21/14 1000  BP: 118/69  Pulse: 59  Temp: 98.3 F (36.8 C)  Resp: 16    Physical Exam  Constitutional: She is oriented to person, place, and time and well-developed, well-nourished, and in no distress.  HENT:  Head: Normocephalic and atraumatic.  Nose: Nose normal.  Mouth/Throat: Oropharynx is clear and moist. No oropharyngeal exudate.  Eyes: Conjunctivae and EOM are normal. Pupils are equal, round, and reactive to light. Right eye exhibits no discharge. Left eye exhibits no discharge. No scleral icterus.  Neck: Normal range of motion. Neck supple. No tracheal deviation present. No thyromegaly present.  Cardiovascular: Normal rate, regular rhythm and normal heart sounds.  Exam reveals no gallop and no friction rub.   No murmur heard. Pulmonary/Chest: Effort normal and breath sounds normal. She has no wheezes. She has no rales.  Abdominal: Soft. Bowel sounds are normal. She exhibits no distension and no mass. There is no tenderness. There is no  rebound and no guarding.  Musculoskeletal: Normal range of motion. She exhibits no edema or tenderness.  Lymphadenopathy:    She has no cervical adenopathy.  Neurological: She is alert and oriented to person, place, and time. She has normal reflexes. No cranial nerve deficit. Gait normal. Coordination normal.  Skin: Skin is warm and dry. No rash noted.  Psychiatric: Mood, memory, affect and judgment normal.  Nursing note and vitals reviewed. Bilateral breast exam performed without palpable masses, no axillary adenopathy, no supraclavicular adenopathy. No nipple changes, no skin retraction or significant abnormality  LABORATORY DATA:  CBC    Component Value Date/Time   WBC 11.6* 08/21/2014 1023   RBC 4.29 08/21/2014 1023   HGB 11.6* 08/21/2014 1023   HCT 36.2 08/21/2014 1023   PLT 393 08/21/2014 1023   MCV 84.4 08/21/2014 1023   MCH 27.0 08/21/2014 1023   MCHC 32.0 08/21/2014 1023   RDW 16.4* 08/21/2014 1023   LYMPHSABS 2.8 08/21/2014 1023   MONOABS 0.7 08/21/2014 1023   EOSABS 0.3 08/21/2014 1023   BASOSABS 0.1 08/21/2014 1023   CMP     Component Value Date/Time   NA 138 08/21/2014 1023   K 4.7 08/21/2014 1023   CL 103 08/21/2014 1023   CO2 27 08/21/2014 1023   GLUCOSE 100* 08/21/2014 1023   BUN 24* 08/21/2014 1023   CREATININE 0.94 08/21/2014 1023   CREATININE 0.92 06/15/2014 0937   CALCIUM 10.3 08/21/2014 1023   CALCIUM 10.0 08/22/2013 0939   PROT 7.8 08/21/2014 1023   ALBUMIN 3.7 08/21/2014 1023   AST 94* 08/21/2014 1023   ALT 338* 08/21/2014 1023   ALKPHOS 72 08/21/2014 1023   BILITOT 0.4 08/21/2014 1023   GFRNONAA >60 08/21/2014 1023   GFRNONAA 65 06/15/2014 0937   GFRAA >60 08/21/2014 1023   GFRAA 75 06/15/2014 0937      ASSESSMENT and THERAPY PLAN:   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   Abnormal LFT's    She is up to date on her screening including mammogram and C-scope.  Breast exam today is unremarkable. She is a very compliant patient. Her last DEXA showed a normal bone density.  He has had intermittent elevated calcium levels with a normal  PTH. Encouraged her to perhaps consider a calcium supplement with D every other day. We will monitor her calcium levels.  Liver functions today are quite elevated I am not aware of any new medication. I will have her hold her Arimidex and recheck in several weeks. I will also make Dr. Moshe Cipro aware. I will technically plan on seeing the patient back in 3 months with a repeat physical exam. She have any difficulties in the interim she was advised to call.  Recommended taking a calcium supplement every other day.  All questions were answered. The patient knows to call the clinic with any problems, questions or concerns. We can certainly see the patient much sooner if necessary. This note was electronically signed.  This document serves as a record of services personally performed by Ancil Linsey, MD. It was created on her behalf by Pearlie Oyster, a trained medical scribe. The creation of this record is based on the scribe's personal observations and the provider's statements to them. This document has been checked and approved by the attending provider.    I have reviewed the above documentation for accuracy and completeness, and I agree with the above.  Kelby Fam. Jennica Tagliaferri MD

## 2014-08-21 NOTE — Telephone Encounter (Signed)
Message left for patient to call office to discuss. 

## 2014-08-21 NOTE — Patient Instructions (Signed)
[place] at [facility] Discharge Instructions  RECOMMENDATIONS MADE BY THE CONSULTANT AND ANY TEST RESULTS WILL BE SENT TO YOUR REFERRING PHYSICIAN.  Exam and discussion by Dr. Whitney Muse. No changes in therapy, Report any new lumps, bone pain, shortness of breath or other symptoms.  Follow-up in 3 months.  Thank you for choosing [place] at [facility] to provide your oncology and hematology care.  To afford each patient quality time with our provider, please arrive at least 15 minutes before your scheduled appointment time.    You need to re-schedule your appointment should you arrive 10 or more minutes late.  We strive to give you quality time with our providers, and arriving late affects you and other patients whose appointments are after yours.  Also, if you no show three or more times for appointments you may be dismissed from the clinic at the providers discretion.     Again, thank you for choosing [facility].  Our hope is that these requests will decrease the amount of time that you wait before being seen by our physicians.       _____________________________________________________________  Should you have questions after your visit to [facility], please contact our office at [phone] between the hours of 8:30 a.m. and 4:30 p.m.  Voicemails left after 4:30 p.m. will not be returned until the following business day.  For prescription refill requests, have your pharmacy contact our office.

## 2014-08-21 NOTE — Progress Notes (Signed)
LABS DRAWN

## 2014-08-21 NOTE — Telephone Encounter (Signed)
-----   Message from Patrici Ranks, MD sent at 08/21/2014  1:04 PM EDT -----  Please have patient hold her arimidex. Her liver function tests are very abnormal.  Order repeat LFT's in 2 weeks. Will also send note to Dr. Moshe Cipro.  Dr.P  Dr. Moshe Cipro, Shannon Romero, Shannon Romero's LFT's are very elevated. Not sure of the cause, I am going to hold her arimidex and recheck in 2 weeks. Shannon Romero

## 2014-08-21 NOTE — Telephone Encounter (Signed)
Error

## 2014-08-22 ENCOUNTER — Telehealth: Payer: Self-pay | Admitting: Family Medicine

## 2014-08-22 LAB — CANCER ANTIGEN 27.29: CA 27.29: 37.5 U/mL (ref 0.0–38.6)

## 2014-08-22 NOTE — Telephone Encounter (Signed)
Pls call and advixse pt to hold her lipitor for next 6 weeks as I have heard form her oncologist that herliver enzymes arre high, which is new.  Ask her to call back in 2 months to se if ok to resume if she has not heard from [place]. Pls sedn yourself a personal reminder to check on this in 6 weeks with me please

## 2014-08-23 NOTE — Telephone Encounter (Signed)
Called patient and left message for them to return call at the office   

## 2014-08-24 NOTE — Telephone Encounter (Signed)
Called patient and left message for them to return call at the office   

## 2014-08-24 NOTE — Telephone Encounter (Signed)
Patient aware.

## 2014-08-30 ENCOUNTER — Other Ambulatory Visit: Payer: Self-pay

## 2014-08-30 DIAGNOSIS — M549 Dorsalgia, unspecified: Secondary | ICD-10-CM

## 2014-08-30 MED ORDER — HYDROCODONE-ACETAMINOPHEN 5-325 MG PO TABS: ORAL_TABLET | ORAL | Status: DC

## 2014-09-04 ENCOUNTER — Other Ambulatory Visit (HOSPITAL_COMMUNITY): Payer: PPO

## 2014-09-06 ENCOUNTER — Encounter: Payer: Self-pay | Admitting: Family Medicine

## 2014-09-06 ENCOUNTER — Ambulatory Visit (INDEPENDENT_AMBULATORY_CARE_PROVIDER_SITE_OTHER): Payer: PPO | Admitting: Family Medicine

## 2014-09-06 VITALS — BP 118/70 | HR 79 | Resp 16 | Ht 63.0 in | Wt 211.0 lb

## 2014-09-06 DIAGNOSIS — I10 Essential (primary) hypertension: Secondary | ICD-10-CM | POA: Diagnosis not present

## 2014-09-06 DIAGNOSIS — M549 Dorsalgia, unspecified: Secondary | ICD-10-CM

## 2014-09-06 DIAGNOSIS — R7989 Other specified abnormal findings of blood chemistry: Secondary | ICD-10-CM

## 2014-09-06 DIAGNOSIS — E785 Hyperlipidemia, unspecified: Secondary | ICD-10-CM | POA: Diagnosis not present

## 2014-09-06 DIAGNOSIS — E119 Type 2 diabetes mellitus without complications: Secondary | ICD-10-CM

## 2014-09-06 DIAGNOSIS — R945 Abnormal results of liver function studies: Secondary | ICD-10-CM

## 2014-09-06 LAB — HEPATIC FUNCTION PANEL
ALK PHOS: 68 U/L (ref 39–117)
ALT: 171 U/L — ABNORMAL HIGH (ref 0–35)
AST: 65 U/L — ABNORMAL HIGH (ref 0–37)
Albumin: 3.6 g/dL (ref 3.5–5.2)
BILIRUBIN DIRECT: 0.1 mg/dL (ref 0.0–0.3)
BILIRUBIN INDIRECT: 0.3 mg/dL (ref 0.2–1.2)
Total Bilirubin: 0.4 mg/dL (ref 0.2–1.2)
Total Protein: 6.8 g/dL (ref 6.0–8.3)

## 2014-09-06 MED ORDER — GABAPENTIN 600 MG PO TABS: 600.0000 mg | ORAL_TABLET | Freq: Three times a day (TID) | ORAL | Status: DC

## 2014-09-06 NOTE — Progress Notes (Signed)
Subjective:    Patient ID: Shannon Romero, female    DOB: Nov 16, 1945, 69 y.o.   MRN: [id number]  HPI   Shannon Romero     MRN: [id number]      DOB: [birth year]   HPI Shannon Romero is here for follow up and re-evaluation of chronic medical conditions, medication management and review of any available recent lab and radiology data.  Preventive health is updated, specifically  Cancer screening and Immunization.   Questions or concerns regarding consultations or procedures which the PT has had in the interim are  addressed. The PT denies any adverse reactions to current medications since the last visit.  There are no new concerns.  There are no specific complaints   ROS Denies recent fever or chills. Denies sinus pressure, nasal congestion, ear pain or sore throat. Denies chest congestion, productive cough or wheezing. Denies chest pains, palpitations and leg swelling Denies abdominal pain, nausea, vomiting,diarrhea or constipation.   Denies dysuria, frequency, hesitancy or incontinence.  Denies headaches, seizures, numbness, or tingling. Denies depression, anxiety or insomnia. Denies skin break down or rash.   PE  BP 118/70 mmHg  Pulse 79  Resp 16  Ht 5\' 3"  (1.6 m)  Wt 211 lb (95.709 kg)  BMI 37.39 kg/m2  SpO2 96%  Patient alert and oriented and in no cardiopulmonary distress.  HEENT: No facial asymmetry, EOMI,   oropharynx pink and moist.  Neck supple no JVD, no mass.  Chest: Clear to auscultation bilaterally.  CVS: S1, S2 no murmurs, no S3.Regular rate.  ABD: Soft non tender.   Ext: No edema  MS: Adequate though reduced  ROM spine, shoulders, hips and knees.  Skin: Intact, no ulcerations or rash noted.  Psych: Good eye contact, normal affect. Memory intact not anxious or depressed appearing.  CNS: CN 2-12 intact, power,  normal throughout.no focal deficits noted.   Assessment & Plan   Essential hypertension Controlled, no change in medication DASH diet and  commitment to daily physical activity for a minimum of 30 minutes discussed and encouraged, as a part of hypertension management. The importance of attaining a healthy weight is also discussed.  BP/Weight 09/06/2014 08/21/2014 08/16/2014 08/09/2014 06/20/2014 03/05/2014 34/74/2595  Systolic BP 638 756 433 295 188 416 606  Diastolic BP 70 69 55 62 74 74 67  Wt. (Lbs) 211 207 - 211 214 206 204.7  BMI 37.39 36.68 - 37.39 37.92 36.5 36.27        Back pain with radiation Improved, but will increase maintainaance dose of gabapentin if tolerated, for better control Exercise and weight also also encouragd  Morbid obesity Unchanged Patient re-educated about  the importance of commitment to a  minimum of 150 minutes of exercise per week.  The importance of healthy food choices with portion control discussed. Encouraged to start a food diary, count calories and to consider  joining a support group. Sample diet sheets offered. Goals set by the patient for the next several months.   Weight /BMI 09/06/2014 08/21/2014 08/09/2014  WEIGHT 211 lb 207 lb 211 lb  HEIGHT 5\' 3"  - -  BMI 37.39 kg/m2 36.68 kg/m2 37.39 kg/m2    Current exercise per week 90 minutes.Limited due to severe arthritis flare   Hyperlipidemia LDL goal <100 Hold statin due to marked elevation in LFT's , rept labs in 6 to 8 weeks, being watched in conjunction with hematologist, currently being treated for breast cancer  Hyperlipidemia:Low fat diet discussed and encouraged.   Lipid  Panel  Lab Results  Component Value Date   CHOL 180 06/15/2014   HDL 64 06/15/2014   LDLCALC 101* 06/15/2014   TRIG 75 06/15/2014   CHOLHDL 2.8 06/15/2014        Elevated LFTs Acute elevation in LFT, statin being held, rept lab , if continues to be elevated , imaging study proposed if no improvement  Diabetes type 2, controlled Controlled, no change in medication Patient educated about the importance of limiting  Carbohydrate intake , the  need to commit to daily physical activity for a minimum of 30 minutes , and to commit weight loss. The fact that changes in all these areas will reduce or eliminate all together the development of diabetes is stressed.   Diabetic Labs Latest Ref Rng 08/21/2014 06/15/2014 02/20/2014 02/12/2014 10/16/2013  HbA1c <5.7 % - 6.9(H) - 6.7(H) 6.5(H)  Microalbumin <2.0 mg/dL - - - 0.5 -  Micro/Creat Ratio 0.0 - 30.0 mg/g - - - 3.2 -  Chol 0 - 200 mg/dL - 180 - - 149  HDL >=46 mg/dL - 64 - - 49  Calc LDL 0 - 99 mg/dL - 101(H) - - 87  Triglycerides <150 mg/dL - 75 - - 67  Creatinine 0.44 - 1.00 mg/dL 0.94 0.92 0.78 - 1.01   BP/Weight 09/06/2014 08/21/2014 08/16/2014 08/09/2014 06/20/2014 03/05/2014 72/53/6644  Systolic BP 034 742 595 638 756 433 295  Diastolic BP 70 69 55 62 74 74 67  Wt. (Lbs) 211 207 - 211 214 206 204.7  BMI 37.39 36.68 - 37.39 37.92 36.5 36.27   Foot/eye exam completion dates Latest Ref Rng 09/06/2014 07/03/2014  Eye Exam No Retinopathy - No Retinopathy  Foot Form Completion - Done -            Review of Systems     Objective:   Physical Exam        Assessment & Plan:

## 2014-09-06 NOTE — Patient Instructions (Signed)
Annual; physical exam in 3 month, call if you need me before  Foot exam is good  Commit to exercise, and work on weight loss  INCREASE gabapentin dose to 600 mg one three times daily (OK to take  Two 300 mg caps three times daily, till done)  Hepatic panel today, if still abnormal you will need [place] of liver we will call next week about this  Fasting labs in July  Please work on good  health habits so that your health will improve. 1. Commitment to daily physical activity for 30 to 60  minutes, if you are able to do this.  2. Commitment to wise food choices. Aim for half of your  food intake to be vegetable and fruit, one quarter starchy foods, and one quarter protein. Try to eat on a regular schedule  3 meals per day, snacking between meals should be limited to vegetables or fruits or small portions of nuts. 64 ounces of water per day is generally recommended, unless you have specific health conditions, like heart failure or kidney failure where you will need to limit fluid intake.  3. Commitment to sufficient and a  good quality of physical and mental rest daily, generally between 6 to 8 hours per day.  WITH PERSISTANCE AND PERSEVERANCE, THE IMPOSSIBLE , BECOMES THE NORM!   Thanks for choosing [facility], we consider it a privelige to serve you.

## 2014-09-22 NOTE — Progress Notes (Signed)
   Subjective:    Patient ID: Dimitri Ped, female    DOB: [birth year], 69 y.o.   MRN: [id number]  HPI 1 week h/o increased and uncontrolled back pain with radiation, no aggravating or relieving factors known. 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 Here for help with pain management   Review of Systems See HPI Denies recent fever or chills. Denies sinus pressure, nasal congestion, ear pain or sore throat. Denies chest congestion, productive cough or wheezing. Denies chest pains, palpitations and leg swelling Denies abdominal pain, nausea, vomiting,diarrhea or constipation.   Denies dysuria, frequency, hesitancy or incontinence.       Objective:   Physical Exam BP 104/62 mmHg  Pulse 93  Resp 18  Wt 211 lb (95.709 kg)  SpO2 98% Patient alert and oriented and in no cardiopulmonary distress.Pt in pain  HEENT: No facial asymmetry, EOMI,   oropharynx pink and moist.  Neck supple no JVD, no mass.  Chest: Clear to auscultation bilaterally.  CVS: S1, S2 no murmurs, no S3.Regular rate.  ABD: Soft non tender.   Ext: No edema  MS: Decreased ROM spine, adequate in  shoulders,  Decreased in left lower extremitry        Assessment & Plan:  Back pain with radiation 1 week flare of uncontrolled pain, radiating to left leg Needs increased dose of the gabapentin, and start daily hydrocodone Epidural injection  Essential hypertension Controlled, no change in medication DASH diet and commitment to daily physical activity for a minimum of 30 minutes discussed and encouraged, as a part of hypertension management. The importance of attaining a healthy weight is also discussed.  BP/Weight 09/06/2014 08/21/2014 08/16/2014 08/09/2014 06/20/2014 03/05/2014 44/96/7591  Systolic BP 638 466 599 357 017 793 903  Diastolic BP 70 69 55 62 74 74 67  Wt. (Lbs) 211 207 - 211 214 206 204.7  BMI 37.39 36.68  - 37.39 37.92 36.5 36.27

## 2014-09-22 NOTE — Assessment & Plan Note (Signed)
Controlled, no change in medication DASH diet and commitment to daily physical activity for a minimum of 30 minutes discussed and encouraged, as a part of hypertension management. The importance of attaining a healthy weight is also discussed.  BP/Weight 09/06/2014 08/21/2014 08/16/2014 08/09/2014 06/20/2014 03/05/2014 70/96/4383  Systolic BP 818 403 754 360 677 034 035  Diastolic BP 70 69 55 62 74 74 67  Wt. (Lbs) 211 207 - 211 214 206 204.7  BMI 37.39 36.68 - 37.39 37.92 36.5 36.27

## 2014-09-23 DIAGNOSIS — R945 Abnormal results of liver function studies: Secondary | ICD-10-CM

## 2014-09-23 DIAGNOSIS — R7989 Other specified abnormal findings of blood chemistry: Secondary | ICD-10-CM | POA: Insufficient documentation

## 2014-09-23 NOTE — Assessment & Plan Note (Signed)
Controlled, no change in medication Patient educated about the importance of limiting  Carbohydrate intake , the need to commit to daily physical activity for a minimum of 30 minutes , and to commit weight loss. The fact that changes in all these areas will reduce or eliminate all together the development of diabetes is stressed.   Diabetic Labs Latest Ref Rng 08/21/2014 06/15/2014 02/20/2014 02/12/2014 10/16/2013  HbA1c <5.7 % - 6.9(H) - 6.7(H) 6.5(H)  Microalbumin <2.0 mg/dL - - - 0.5 -  Micro/Creat Ratio 0.0 - 30.0 mg/g - - - 3.2 -  Chol 0 - 200 mg/dL - 180 - - 149  HDL >=46 mg/dL - 64 - - 49  Calc LDL 0 - 99 mg/dL - 101(H) - - 87  Triglycerides <150 mg/dL - 75 - - 67  Creatinine 0.44 - 1.00 mg/dL 0.94 0.92 0.78 - 1.01   BP/Weight 09/06/2014 08/21/2014 08/16/2014 08/09/2014 06/20/2014 03/05/2014 16/04/930  Systolic BP 355 732 202 542 706 237 628  Diastolic BP 70 69 55 62 74 74 67  Wt. (Lbs) 211 207 - 211 214 206 204.7  BMI 37.39 36.68 - 37.39 37.92 36.5 36.27   Foot/eye exam completion dates Latest Ref Rng 09/06/2014 07/03/2014  Eye Exam No Retinopathy - No Retinopathy  Foot Form Completion - Done -

## 2014-09-23 NOTE — Assessment & Plan Note (Signed)
Unchanged Patient re-educated about  the importance of commitment to a  minimum of 150 minutes of exercise per week.  The importance of healthy food choices with portion control discussed. Encouraged to start a food diary, count calories and to consider  joining a support group. Sample diet sheets offered. Goals set by the patient for the next several months.   Weight /BMI 09/06/2014 08/21/2014 08/09/2014  WEIGHT 211 lb 207 lb 211 lb  HEIGHT 5\' 3"  - -  BMI 37.39 kg/m2 36.68 kg/m2 37.39 kg/m2    Current exercise per week 90 minutes.Limited due to severe arthritis flare

## 2014-09-23 NOTE — Assessment & Plan Note (Signed)
Controlled, no change in medication DASH diet and commitment to daily physical activity for a minimum of 30 minutes discussed and encouraged, as a part of hypertension management. The importance of attaining a healthy weight is also discussed.  BP/Weight 09/06/2014 08/21/2014 08/16/2014 08/09/2014 06/20/2014 03/05/2014 24/58/0998  Systolic BP 338 250 539 767 341 937 902  Diastolic BP 70 69 55 62 74 74 67  Wt. (Lbs) 211 207 - 211 214 206 204.7  BMI 37.39 36.68 - 37.39 37.92 36.5 36.27

## 2014-09-23 NOTE — Assessment & Plan Note (Addendum)
Hold statin due to marked elevation in LFT's , rept labs in 6 to 8 weeks, being watched in conjunction with hematologist, currently being treated for breast cancer  Hyperlipidemia:Low fat diet discussed and encouraged.   Lipid Panel  Lab Results  Component Value Date   CHOL 180 06/15/2014   HDL 64 06/15/2014   LDLCALC 101* 06/15/2014   TRIG 75 06/15/2014   CHOLHDL 2.8 06/15/2014

## 2014-09-23 NOTE — Assessment & Plan Note (Signed)
Acute elevation in LFT, statin being held, rept lab , if continues to be elevated , imaging study proposed if no improvement

## 2014-09-23 NOTE — Assessment & Plan Note (Signed)
Improved, but will increase maintainaance dose of gabapentin if tolerated, for better control Exercise and weight also also encouragd

## 2014-10-31 LAB — COMPLETE METABOLIC PANEL WITH GFR
ALT: 25 U/L (ref 0–35)
AST: 19 U/L (ref 0–37)
Albumin: 3.7 g/dL (ref 3.5–5.2)
Alkaline Phosphatase: 64 U/L (ref 39–117)
BILIRUBIN TOTAL: 0.4 mg/dL (ref 0.2–1.2)
BUN: 16 mg/dL (ref 6–23)
CALCIUM: 10.2 mg/dL (ref 8.4–10.5)
CHLORIDE: 102 meq/L (ref 96–112)
CO2: 22 meq/L (ref 19–32)
CREATININE: 0.91 mg/dL (ref 0.50–1.10)
GFR, EST AFRICAN AMERICAN: 76 mL/min
GFR, Est Non African American: 65 mL/min
Glucose, Bld: 166 mg/dL — ABNORMAL HIGH (ref 70–99)
Potassium: 4.4 mEq/L (ref 3.5–5.3)
Sodium: 139 mEq/L (ref 135–145)
Total Protein: 6.8 g/dL (ref 6.0–8.3)

## 2014-10-31 LAB — URIC ACID: Uric Acid, Serum: 4.1 mg/dL (ref 2.4–7.0)

## 2014-10-31 LAB — HEMOGLOBIN A1C
Hgb A1c MFr Bld: 7.6 % — ABNORMAL HIGH (ref <5.7)
Mean Plasma Glucose: 171 mg/dL — ABNORMAL HIGH (ref <117)

## 2014-11-05 ENCOUNTER — Ambulatory Visit (HOSPITAL_COMMUNITY)
Admission: RE | Admit: 2014-11-05 | Discharge: 2014-11-05 | Disposition: A | Discharge status: Home / Self Care | Payer: PPO | Source: Ambulatory Visit | Attending: Family Medicine | Admitting: Family Medicine

## 2014-11-05 ENCOUNTER — Ambulatory Visit (INDEPENDENT_AMBULATORY_CARE_PROVIDER_SITE_OTHER): Payer: PPO | Admitting: Family Medicine

## 2014-11-05 ENCOUNTER — Encounter: Payer: Self-pay | Admitting: Family Medicine

## 2014-11-05 VITALS — BP 124/68 | HR 94 | Resp 18 | Ht 63.0 in | Wt 215.0 lb

## 2014-11-05 DIAGNOSIS — M79601 Pain in right arm: Secondary | ICD-10-CM | POA: Insufficient documentation

## 2014-11-05 DIAGNOSIS — M549 Dorsalgia, unspecified: Secondary | ICD-10-CM

## 2014-11-05 DIAGNOSIS — Z1211 Encounter for screening for malignant neoplasm of colon: Secondary | ICD-10-CM

## 2014-11-05 DIAGNOSIS — R079 Chest pain, unspecified: Secondary | ICD-10-CM | POA: Diagnosis not present

## 2014-11-05 DIAGNOSIS — M5032 Other cervical disc degeneration, mid-cervical region: Secondary | ICD-10-CM | POA: Diagnosis not present

## 2014-11-05 DIAGNOSIS — I10 Essential (primary) hypertension: Secondary | ICD-10-CM

## 2014-11-05 DIAGNOSIS — M542 Cervicalgia: Secondary | ICD-10-CM

## 2014-11-05 DIAGNOSIS — Z0000 Encounter for general adult medical examination without abnormal findings: Secondary | ICD-10-CM | POA: Insufficient documentation

## 2014-11-05 DIAGNOSIS — E119 Type 2 diabetes mellitus without complications: Secondary | ICD-10-CM | POA: Diagnosis not present

## 2014-11-05 DIAGNOSIS — E785 Hyperlipidemia, unspecified: Secondary | ICD-10-CM

## 2014-11-05 LAB — HEMOCCULT GUIAC POC 1CARD (OFFICE): FECAL OCCULT BLD: NEGATIVE

## 2014-11-05 MED ORDER — GLIPIZIDE ER 2.5 MG PO TB24: 2.5000 mg | ORAL_TABLET | Freq: Every day | ORAL | Status: AC

## 2014-11-05 MED ORDER — PRAVASTATIN SODIUM 10 MG PO TABS: 10.0000 mg | ORAL_TABLET | Freq: Every day | ORAL | Status: DC

## 2014-11-05 MED ORDER — HYDROCODONE-ACETAMINOPHEN 5-325 MG PO TABS: ORAL_TABLET | ORAL | Status: DC

## 2014-11-05 NOTE — Patient Instructions (Addendum)
F/u mid November , call if you need me before  You need updated xray of neck pls get this today, I believe you have significant problems in neck causing right arm and anterior chest pain  New additional medication for diabetes as blood sugar has increased  Resume low dose of cholesterol medication which is sent in   Non fast lipid in 6 weeks  Fasting lipid, cmp and EGFr and HBA1C and microalb Nov 4 or after  You are referred back to neurosurgeon re low back pain which is worsening and which has not responded to recent epidural  Please work on good  health habits so that your health will improve. 1. Commitment to daily physical activity for 30 to 60  minutes, if you are able to do this.  2. Commitment to wise food choices. Aim for half of your  food intake to be vegetable and fruit, one quarter starchy foods, and one quarter protein. Try to eat on a regular schedule  3 meals per day, snacking between meals should be limited to vegetables or fruits or small portions of nuts. 64 ounces of water per day is generally recommended, unless you have specific health conditions, like heart failure or kidney failure where you will need to limit fluid intake.  3. Commitment to sufficient and a  good quality of physical and mental rest daily, generally between 6 to 8 hours per day.  WITH PERSISTANCE AND PERSEVERANCE, THE IMPOSSIBLE , BECOMES THE NORM!  Thanks for choosing [facility], we consider it a privelige to serve you.

## 2014-11-05 NOTE — Assessment & Plan Note (Signed)

## 2014-11-18 NOTE — Assessment & Plan Note (Signed)
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

## 2014-11-18 NOTE — Assessment & Plan Note (Signed)
Worsening back pain with lower extremity weakness and numbness, will refer back to neurosurgeon who last saw her in 2014 for re evaluation

## 2014-11-18 NOTE — Assessment & Plan Note (Addendum)
Deteriorated , additional med added Shannon Romero is reminded of the importance of commitment to daily physical activity for 30 minutes or more, as able and the need to limit carbohydrate intake to 30 to 60 grams per meal to help with blood sugar control.   The need to take medication as prescribed, test blood sugar as directed, and to call between visits if there is a concern that blood sugar is uncontrolled is also discussed.   Shannon Romero is reminded of the importance of daily foot exam, annual eye examination, and good blood sugar, blood pressure and cholesterol control.  Diabetic Labs Latest Ref Rng 10/30/2014 08/21/2014 06/15/2014 02/20/2014 02/12/2014  HbA1c <5.7 % 7.6(H) - 6.9(H) - 6.7(H)  Microalbumin <2.0 mg/dL - - - - 0.5  Micro/Creat Ratio 0.0 - 30.0 mg/g - - - - 3.2  Chol 0 - 200 mg/dL - - 180 - -  HDL >=46 mg/dL - - 64 - -  Calc LDL 0 - 99 mg/dL - - 101(H) - -  Triglycerides <150 mg/dL - - 75 - -  Creatinine 0.50 - 1.10 mg/dL 0.91 0.94 0.92 0.78 -   BP/Weight 11/05/2014 09/06/2014 08/21/2014 08/16/2014 08/09/2014 06/20/2014 38/25/0539  Systolic BP 767 341 937 902 409 735 329  Diastolic BP 68 70 69 55 62 74 74  Wt. (Lbs) 215 211 207 - 211 214 206  BMI 38.09 37.39 36.68 - 37.39 37.92 36.5   Foot/eye exam completion dates Latest Ref Rng 09/06/2014 07/03/2014  Eye Exam No Retinopathy - No Retinopathy  Foot Form Completion - Done -

## 2014-11-18 NOTE — Progress Notes (Signed)
Subjective:    Patient ID: Shannon Romero, female    DOB: 05/20/1945, 69 y.o.   MRN: [id number]  HPI Patient is in for annual physical exam. C/o increased and uncontrolled  low back pain affecting lower extremities, right more than left with weakness and loss of sensation also c/o ongoing right shoulder and arm pain radiating from neck worsening and notes some right upper extremity  Weakness also Recent labs, are reviewed, and medication adjustments made Immunization is reviewed , and  updated if needed.    Review of Systems See HPI     Objective:   Physical Exam  BP 124/68 mmHg  Pulse 94  Resp 18  Ht 5\' 3"  (1.6 m)  Wt 215 lb (97.523 kg)  BMI 38.09 kg/m2  SpO2 97%  Pleasant well nourished female, alert and oriented x 3, in no cardio-pulmonary distress. Afebrile. HEENT No facial trauma or asymetry. Sinuses non tender.  Extra occullar muscles intact, pupils equally reactive to light. External ears normal, tympanic membranes clear. Oropharynx moist, no exudate, good dentition. Neck: decreased ROM, no adenopathy,JVD or thyromegaly.No bruits.  Chest: Clear to ascultation bilaterally.No crackles or wheezes. Reproducible right anterior chest wall pain radiating from right neck and shoulder  Breast: Partial mastectomy left breast,no masses or lumps. No tenderness. No nipple discharge or inversion. No axillary or supraclavicular adenopathy  Cardiovascular system; Heart sounds normal,  S1 and  S2 ,no S3.  No murmur, or thrill. Apical beat not displaced Peripheral pulses normal.  Abdomen: Soft, non tender, no organomegaly or masses. No bruits. Bowel sounds normal. No guarding, tenderness or rebound.  Rectal:  Normal sphincter tone. No mass.No rectal masses.  Guaiac negative stool.  GU: External genitalia normal female genitalia , female distribution of hair. No lesions. Urethral meatus normal in size, no  Prolapse, no lesions visibly  Present. Bladder non  tender. Vagina pink and moist , with no visible lesions , discharge present . Adequate pelvic support no  cystocele or rectocele noted Cervix pink and appears healthy, no lesions or ulcerations noted, no discharge noted from os Uterus normal size, no adnexal masses, no cervical motion or adnexal tenderness.   Musculoskeletal exam: Decreased  ROM of spine,  right hip , shoulder and knee. muscle wasting noted of right lower extremity   Neurologic: Cranial nerves 2 to 12 intact. Power, tone ,sensation and reflexes decerased in right upper and lower extremities.Abnormal gait. No tremor.Grade 4 power in right upper and lower extremities  Skin: Intact, no ulceration, erythema , scaling or rash noted. Pigmentation normal throughout  Psych; Normal mood and affect. Judgement and concentration normal       Assessment & Plan:  Annual physical exam Annual exam as documented. Counseling done  re healthy lifestyle involving commitment to 150 minutes exercise per week, heart healthy diet, and attaining healthy weight.The importance of adequate sleep also discussed. Regular seat belt use and home safety, is also discussed. Changes in health habits are decided on by the patient with goals and time frames  set for achieving them. Immunization and cancer screening needs are specifically addressed at this visit.   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   The need to take medication as prescribed, test blood sugar as directed, and to call between visits if there is a concern that blood sugar is uncontrolled is also discussed.   Shannon Romero is reminded of the importance of daily foot exam, annual eye examination, and good blood sugar, blood pressure and cholesterol control.  Diabetic Labs Latest Ref Rng 10/30/2014 08/21/2014 06/15/2014 02/20/2014 02/12/2014  HbA1c <5.7 % 7.6(H) - 6.9(H) - 6.7(H)  Microalbumin <2.0 mg/dL - - - - 0.5  Micro/Creat Ratio 0.0 - 30.0 mg/g - - - - 3.2  Chol 0 - 200 mg/dL - - 180 - -  HDL >=46 mg/dL - - 64 - -  Calc LDL 0 - 99 mg/dL - - 101(H) - -  Triglycerides <150 mg/dL - - 75 - -  Creatinine 0.50 - 1.10 mg/dL 0.91 0.94 0.92 0.78 -   BP/Weight 11/05/2014 09/06/2014 08/21/2014 08/16/2014 08/09/2014 06/20/2014 27/10/8673  Systolic BP 449 201 007 121 975 883 254  Diastolic BP 68 70 69 55 62 74 74  Wt. (Lbs) 215 211 207 - 211 214 206  BMI 38.09 37.39 36.68 - 37.39 37.92 36.5   Foot/eye exam completion dates Latest Ref Rng 09/06/2014 07/03/2014  Eye Exam No Retinopathy - No Retinopathy  Foot Form Completion - Done -         Hyperlipidemia LDL goal <100 Hyperlipidemia:Low fat diet discussed and encouraged.   Lipid Panel  Lab Results  Component Value Date   CHOL 180 06/15/2014   HDL 64 06/15/2014   LDLCALC 101* 06/15/2014   TRIG 75 06/15/2014   CHOLHDL 2.8 06/15/2014   Pt to resume low dose of statin with 6 week f/u of labs as she had marked transaminitis earlier this year , which may have also been from her chemotherapy

## 2014-11-18 NOTE — Assessment & Plan Note (Signed)
Hyperlipidemia:Low fat diet discussed and encouraged.   Lipid Panel  Lab Results  Component Value Date   CHOL 180 06/15/2014   HDL 64 06/15/2014   LDLCALC 101* 06/15/2014   TRIG 75 06/15/2014   CHOLHDL 2.8 06/15/2014   Pt to resume low dose of statin with 6 week f/u of labs as she had marked transaminitis earlier this year , which may have also been from her chemotherapy

## 2014-11-21 ENCOUNTER — Encounter (HOSPITAL_COMMUNITY): Payer: Self-pay | Admitting: Hematology & Oncology

## 2014-11-21 ENCOUNTER — Encounter (HOSPITAL_COMMUNITY): Payer: PPO | Attending: Hematology & Oncology | Admitting: Oncology

## 2014-11-21 VITALS — BP 109/58 | HR 74 | Temp 97.8°F | Resp 18 | Wt 212.7 lb

## 2014-11-21 DIAGNOSIS — C50412 Malignant neoplasm of upper-outer quadrant of left female breast: Secondary | ICD-10-CM | POA: Diagnosis not present

## 2014-11-21 DIAGNOSIS — R945 Abnormal results of liver function studies: Secondary | ICD-10-CM | POA: Insufficient documentation

## 2014-11-21 DIAGNOSIS — Z79811 Long term (current) use of aromatase inhibitors: Secondary | ICD-10-CM

## 2014-11-21 DIAGNOSIS — C50919 Malignant neoplasm of unspecified site of unspecified female breast: Secondary | ICD-10-CM | POA: Insufficient documentation

## 2014-11-21 NOTE — Progress Notes (Signed)
[name], MD [address], [address] [place] [address]  Breast cancer, unspecified laterality - Plan: CBC with Differential, Comprehensive metabolic panel, DG Bone Density  Aromatase inhibitor use - Plan: CBC with Differential, Comprehensive metabolic panel, DG Bone Density  CURRENT THERAPY: Anastrozole 1 mg daily beginning on 06/11/2014  INTERVAL HISTORY: Shannon Romero 69 y.o. 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  S/P partial mastectomy, radiation, and now on AI therapy.    Breast cancer   04/27/2013 Initial Biopsy Breast, left, needle core biopsy, posterior lateral - INVASIVE MAMMARY CARCINOMA   06/07/2013 Definitive Surgery Breast, partial mastectomy, left - INVASIVE DUCTAL CARCINOMA, GRADE 2 OF 3, SPANNING 1.2 CM. - INVASIVE CARCINOMA <0.1 CM OF INFERIOR MARGIN AND CAUTERIZED TUMOR AT LATERAL MARGIN. - DUCTAL CARCINOMA IN SITU, INTERMEDIATE GRADE. - DUCTAL CARCINOMA IN   06/21/2013 Surgery Breast, excision, left - BENIGN BREAST TISSUE AND ADIPOSE TISSUE WITH PREVIOUS EXCISIONAL SITE CHANGES. - NO EVIDENCE OF ATYPIA OR MALIGNANCY. - NEW MARGIN, NEGATIVE FOR ATYPIA OR MALIGNANCY.   07/02/2013 Initial Diagnosis Breast cancer   07/26/2013 Pathology Results Oncotype recurrence score was 17 putting her at average rate of distant recurrence of 10%.   08/21/2013 - 10/05/2013 Radiation Therapy Dr. Pablo Ledger.  45 Gy at 1.8 Gy/fraction x 25 fractions and 16 Gy at 2 Gy/fraction x 8 fractions   10/11/2013 -  Anti-estrogen oral therapy Anastrozole, approximate start date    I personally reviewed and went over laboratory results with the patient.  The results are noted within this dictation.  I personally reviewed and went over radiographic studies with the patient.  The results are noted within this dictation.  Next mammogram is due in December 2016.  She is reminded of this.  Additionally, her last bone density exam was in  October 2014.  She is on an AI and therefore, I will get her set-up for another bone density exam.   She denies any arthralgias and myalgias from AI therapy.    Past Medical History  Diagnosis Date  . Hypertension   . Diabetes mellitus   . GERD (gastroesophageal reflux disease)   . Bulging disc   . Breast cancer     has Diabetes type 2, controlled; Morbid obesity; Essential hypertension; GERD; Back pain with radiation; NECK PAIN; Cataracts, bilateral; Family history of colon cancer; Breast cancer; Shoulder pain, right; Need for vaccination with 13-polyvalent pneumococcal conjugate vaccine; Hyperlipidemia LDL goal <100; Elevated LFTs; and Annual physical exam on her problem list.     is allergic to tramadol.  Current Outpatient Prescriptions on File Prior to Visit  Medication Sig Dispense Refill  . allopurinol (ZYLOPRIM) 300 MG tablet Take 1 tablet (300 mg total) by mouth daily. 30 tablet 4  . anastrozole (ARIMIDEX) 1 MG tablet Take 1 tablet (1 mg total) by mouth daily. 90 tablet 3  . aspirin 81 MG tablet Take 81 mg by mouth daily.      . Calcium Carb-Cholecalciferol (CALCIUM + D3) 600-200 MG-UNIT TABS Take 1 tablet by mouth daily.     . cyanocobalamin 100 MCG tablet Take 100 mcg by mouth daily.    . fish oil-omega-3 fatty acids 1000 MG capsule Take 1 g by mouth daily.      Marland Kitchen gabapentin (NEURONTIN) 600 MG tablet Take 1 tablet (600 mg total) by mouth 3 (three) times daily. 90 tablet 5  . glipiZIDE (GLUCOTROL XL) 2.5 MG 24  hr tablet Take 1 tablet (2.5 mg total) by mouth daily with breakfast. 30 tablet 5  . HYDROcodone-acetaminophen (NORCO/VICODIN) 5-325 MG per tablet One tablet once daily for uncontrolled pain 30 tablet 0  . lisinopril-hydrochlorothiazide (PRINZIDE,ZESTORETIC) 20-25 MG per tablet TAKE ONE TABLET BY MOUTH ONCE DAILY 90 tablet 1  . metFORMIN (GLUCOPHAGE) 1000 MG tablet Take 1 tablet (1,000 mg total) by mouth 2 (two) times daily with a meal. 180 tablet 1  . Multiple Vitamin  (MULTIVITAMIN) capsule Take 1 capsule by mouth daily.    . pravastatin (PRAVACHOL) 10 MG tablet Take 1 tablet (10 mg total) by mouth daily. 30 tablet 5  . pyridoxine (B-6) 100 MG tablet TWO TABS BY MOUTH DAILY     . venlafaxine XR (EFFEXOR-XR) 75 MG 24 hr capsule Take one capsule at that time to control hot flashes. 90 capsule 3   No current facility-administered medications on file prior to visit.    Past Surgical History  Procedure Laterality Date  . Tubal ligation    . Ovarian cyst removal    . Colonoscopy  03/26/2008    WNU:UVOZDG rectum, scattered pancolonic diverticula and colonic/mucosa appeared normal, normal terminal ileum.  . Colonoscopy  02/28/2003    NUR: A few scattered small diverticula throughout the colon/ No evidence of colonic polyps or neoplasm/ Polyps or other lesions which potentially cause GI blood loss  . Colonoscopy N/A 04/21/2013    Procedure: COLONOSCOPY;  Surgeon: Daneil Dolin, MD;  Location: [facility];  Service: Endoscopy;  Laterality: N/A;  10:00  . Partial mastectomy with needle localization and axillary sentinel lymph node bx Left 06/07/2013    Procedure: PARTIAL MASTECTOMY WITH NEEDLE LOCALIZATION AND AXILLARY SENTINEL LYMPH NODE BX;  Surgeon: Jamesetta So, MD;  Location: [facility];  Service: General;  Laterality: Left;  . Re-excision of breast cancer,superior margins Left 06/21/2013    Procedure: RE-EXCISION OF BREAST CANCER;  Surgeon: Jamesetta So, MD;  Location: [facility];  Service: General;  Laterality: Left;    Denies any headaches, dizziness, double vision, fevers, chills, night sweats, nausea, vomiting, diarrhea, constipation, chest pain, heart palpitations, shortness of breath, blood in stool, black tarry stool, urinary pain, urinary burning, urinary frequency, hematuria.   PHYSICAL EXAMINATION  ECOG PERFORMANCE STATUS: 0 - Asymptomatic  Filed Vitals:   11/21/14 1400  BP: 109/58  Pulse: 74  Temp: 97.8 F (36.6 C)  Resp: 18     GENERAL:alert, no distress, well nourished, well developed, comfortable, cooperative, obese and smiling SKIN: skin color, texture, turgor are normal, no rashes or significant lesions HEAD: Normocephalic, No masses, lesions, tenderness or abnormalities EYES: EOMI, Conjunctiva are pink and non-injected EARS: External ears normal OROPHARYNX:lips, buccal mucosa, and tongue normal and mucous membranes are moist  NECK: supple, no adenopathy, thyroid normal size, non-tender, without nodularity, no stridor, non-tender, trachea midline LYMPH:  no palpable lymphadenopathy BREAST:not examined LUNGS: clear to auscultation and percussion HEART: regular rate & rhythm, no murmurs, no gallops, S1 normal and S2 normal ABDOMEN:abdomen soft, non-tender, obese and normal bowel sounds BACK: Back symmetric, no curvature. EXTREMITIES:less then 2 second capillary refill, no joint deformities, effusion, or inflammation, no skin discoloration, no cyanosis  NEURO: gait normal    LABORATORY DATA: CBC    Component Value Date/Time   WBC 11.6* 08/21/2014 1023   RBC 4.29 08/21/2014 1023   HGB 11.6* 08/21/2014 1023   HCT 36.2 08/21/2014 1023   PLT 393 08/21/2014 1023   MCV 84.4 08/21/2014 1023  MCH 27.0 08/21/2014 1023   MCHC 32.0 08/21/2014 1023   RDW 16.4* 08/21/2014 1023   LYMPHSABS 2.8 08/21/2014 1023   MONOABS 0.7 08/21/2014 1023   EOSABS 0.3 08/21/2014 1023   BASOSABS 0.1 08/21/2014 1023      Chemistry      Component Value Date/Time   NA 139 10/30/2014 0845   K 4.4 10/30/2014 0845   CL 102 10/30/2014 0845   CO2 22 10/30/2014 0845   BUN 16 10/30/2014 0845   CREATININE 0.91 10/30/2014 0845   CREATININE 0.94 08/21/2014 1023      Component Value Date/Time   CALCIUM 10.2 10/30/2014 0845   CALCIUM 10.0 08/22/2013 0939   ALKPHOS 64 10/30/2014 0845   AST 19 10/30/2014 0845   ALT 25 10/30/2014 0845   BILITOT 0.4 10/30/2014 0845        PENDING LABS:   RADIOGRAPHIC STUDIES:  Dg  Cervical Spine Complete  11/05/2014   CLINICAL DATA:  Neck pain and right arm and right-sided chest pain for 6 months, progressive. Cervicalgia.  EXAM: CERVICAL SPINE  4+ VIEWS  COMPARISON:  Radiographs dated 07/10/2008  FINDINGS: There is chronic narrowing of the C4-5, C5-6, and C6-7 disc spaces. There is slight narrowing of the left neural foramina at C5-6 and C6-7. Right neural foramina are widely patent. Minimal facet arthritis at C2-3 and C3-4 on the left.  Compared with the prior study of 2010, there has been no significant change.  IMPRESSION: Multilevel degenerative disc disease with slight foraminal narrowing at C5-6 and C6-7 on the left. No change since 2010.   Electronically Signed   By: Lorriane Shire M.D.   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  Now on AI therapy.  Staging completed.  Oncology history developed and updated.  Labs in 3 months: CBC diff, CMET  She is due for her next mammogram in Dec 2016.  She is due for bone density exam in October 2016.  Return in 3 months for follow-up.    THERAPY PLAN:   NCCN guidelines recommends the following surveillance for invasive breast cancer:  A. History and Physical exam every 4-6 months for 5 years and then every 12 months.  B. Mammography every 12 months  C. Women on Tamoxifen: annual gynecologic assessment every 12 months if uterus is present.  D. Women on aromatase inhibitor or who experience ovarian failure secondary to treatment should have monitoring of bone health with a bone mineral density determination at baseline and periodically thereafter.  E. Assess and encourage adherence to adjuvant endocrine therapy.  F. Evidence suggests that active lifestyle and achieving and maintaining an ideal body weight (20-25 BMI) may lead to optimal breast cancer outcomes.   All  questions were answered. The patient knows to call the clinic with any problems, questions or concerns. We can certainly see the patient much sooner if necessary.  Patient and plan discussed with Dr. Ancil Linsey and she is in agreement with the aforementioned.   This note is electronically signed by: Doy Mince 11/21/2014 2:40 PM

## 2014-11-21 NOTE — Patient Instructions (Signed)
..  Shannon Romero at [facility] Discharge Instructions  RECOMMENDATIONS MADE BY THE CONSULTANT AND ANY TEST RESULTS WILL BE SENT TO YOUR REFERRING PHYSICIAN.  Seen and discussion with Robynn Pane  PA-C. Call the cancer center with any questions and/or concerns that you have.   Lab work in 3 months.  Follow up appt in 3 months. Bone density in October 2016 Mammogram in December 2016  Thank you for choosing [place] at [facility] to provide your oncology and hematology care.  To afford each patient quality time with our provider, please arrive at least 15 minutes before your scheduled appointment time.    You need to re-schedule your appointment should you arrive 10 or more minutes late.  We strive to give you quality time with our providers, and arriving late affects you and other patients whose appointments are after yours.  Also, if you no show three or more times for appointments you may be dismissed from the clinic at the providers discretion.     Again, thank you for choosing [facility].  Our hope is that these requests will decrease the amount of time that you wait before being seen by our physicians.       _____________________________________________________________  Should you have questions after your visit to [facility], please contact our office at [phone] between the hours of 8:30 a.m. and 4:30 p.m.  Voicemails left after 4:30 p.m. will not be returned until the following business day.  For prescription refill requests, have your pharmacy contact our office.

## 2014-11-21 NOTE — Assessment & Plan Note (Addendum)
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  Now on AI therapy.  Staging completed.  Oncology history developed and updated.  Labs in 3 months: CBC diff, CMET  She is due for her next mammogram in Dec 2016.  She is due for bone density exam in October 2016.  Return in 3 months for follow-up.

## 2014-11-26 ENCOUNTER — Other Ambulatory Visit (HOSPITAL_COMMUNITY): Payer: Self-pay | Admitting: Oncology

## 2014-11-26 ENCOUNTER — Other Ambulatory Visit: Payer: Self-pay | Admitting: Family Medicine

## 2014-11-26 DIAGNOSIS — C50912 Malignant neoplasm of unspecified site of left female breast: Secondary | ICD-10-CM

## 2014-11-26 MED ORDER — VENLAFAXINE HCL ER 75 MG PO CP24: ORAL_CAPSULE | ORAL | Status: AC

## 2014-11-30 ENCOUNTER — Other Ambulatory Visit: Payer: Self-pay

## 2014-11-30 DIAGNOSIS — M549 Dorsalgia, unspecified: Secondary | ICD-10-CM

## 2014-11-30 MED ORDER — HYDROCODONE-ACETAMINOPHEN 5-325 MG PO TABS: ORAL_TABLET | ORAL | Status: DC

## 2014-12-12 ENCOUNTER — Other Ambulatory Visit: Payer: Self-pay | Admitting: Family Medicine

## 2014-12-13 LAB — LIPID PANEL
CHOLESTEROL: 100 mg/dL — AB (ref 125–200)
HDL: 46 mg/dL (ref >=46)
LDL Cholesterol: 33 mg/dL (ref <130)
Total CHOL/HDL Ratio: 2.2 Ratio (ref <=5.0)
Triglycerides: 105 mg/dL (ref <150)
VLDL: 21 mg/dL (ref <30)

## 2014-12-14 LAB — HEPATIC FUNCTION PANEL
ALBUMIN: 3.9 g/dL (ref 3.6–5.1)
ALK PHOS: 63 U/L (ref 33–130)
ALT: 19 U/L (ref 6–29)
AST: 17 U/L (ref 10–35)
Bilirubin, Direct: 0.1 mg/dL (ref <=0.2)
Indirect Bilirubin: 0.2 mg/dL (ref 0.2–1.2)
TOTAL PROTEIN: 6.9 g/dL (ref 6.1–8.1)
Total Bilirubin: 0.3 mg/dL (ref 0.2–1.2)

## 2015-01-03 ENCOUNTER — Other Ambulatory Visit: Payer: Self-pay

## 2015-01-03 DIAGNOSIS — M549 Dorsalgia, unspecified: Secondary | ICD-10-CM

## 2015-01-03 MED ORDER — HYDROCODONE-ACETAMINOPHEN 5-325 MG PO TABS: ORAL_TABLET | ORAL | Status: DC

## 2015-01-17 ENCOUNTER — Other Ambulatory Visit: Payer: Self-pay | Admitting: Family Medicine

## 2015-01-28 ENCOUNTER — Ambulatory Visit (HOSPITAL_COMMUNITY)
Admission: RE | Admit: 2015-01-28 | Discharge: 2015-01-28 | Disposition: A | Discharge status: Home / Self Care | Payer: PPO | Source: Ambulatory Visit | Attending: Oncology | Admitting: Oncology

## 2015-01-28 ENCOUNTER — Ambulatory Visit (INDEPENDENT_AMBULATORY_CARE_PROVIDER_SITE_OTHER): Payer: PPO

## 2015-01-28 DIAGNOSIS — Z79811 Long term (current) use of aromatase inhibitors: Secondary | ICD-10-CM | POA: Diagnosis not present

## 2015-01-28 DIAGNOSIS — C50919 Malignant neoplasm of unspecified site of unspecified female breast: Secondary | ICD-10-CM | POA: Insufficient documentation

## 2015-01-28 DIAGNOSIS — Z780 Asymptomatic menopausal state: Secondary | ICD-10-CM | POA: Insufficient documentation

## 2015-01-28 DIAGNOSIS — Z23 Encounter for immunization: Secondary | ICD-10-CM | POA: Diagnosis not present

## 2015-02-14 ENCOUNTER — Ambulatory Visit (INDEPENDENT_AMBULATORY_CARE_PROVIDER_SITE_OTHER): Payer: PPO | Admitting: Family Medicine

## 2015-02-14 ENCOUNTER — Encounter: Payer: Self-pay | Admitting: Family Medicine

## 2015-02-14 VITALS — BP 118/74 | HR 84 | Resp 16 | Ht 63.0 in | Wt 211.0 lb

## 2015-02-14 DIAGNOSIS — H6123 Impacted cerumen, bilateral: Secondary | ICD-10-CM | POA: Diagnosis not present

## 2015-02-14 DIAGNOSIS — I10 Essential (primary) hypertension: Secondary | ICD-10-CM | POA: Diagnosis not present

## 2015-02-14 DIAGNOSIS — M542 Cervicalgia: Secondary | ICD-10-CM | POA: Diagnosis not present

## 2015-02-14 DIAGNOSIS — E785 Hyperlipidemia, unspecified: Secondary | ICD-10-CM

## 2015-02-14 DIAGNOSIS — Z1159 Encounter for screening for other viral diseases: Secondary | ICD-10-CM

## 2015-02-14 DIAGNOSIS — E1121 Type 2 diabetes mellitus with diabetic nephropathy: Secondary | ICD-10-CM

## 2015-02-14 DIAGNOSIS — E119 Type 2 diabetes mellitus without complications: Secondary | ICD-10-CM

## 2015-02-14 MED ORDER — METHYLPREDNISOLONE ACETATE 80 MG/ML IJ SUSP
80.0000 mg | Freq: Once | INTRAMUSCULAR | Status: AC
  Administered 2015-02-14: 80 mg via INTRAMUSCULAR

## 2015-02-14 MED ORDER — PREDNISONE 5 MG (21) PO TBPK: 5.0000 mg | ORAL_TABLET | ORAL | Status: DC

## 2015-02-14 MED ORDER — KETOROLAC TROMETHAMINE 60 MG/2ML IM SOLN
60.0000 mg | Freq: Once | INTRAMUSCULAR | Status: AC
  Administered 2015-02-14: 60 mg via INTRAMUSCULAR

## 2015-02-14 MED ORDER — IBUPROFEN 800 MG PO TABS: 800.0000 mg | ORAL_TABLET | Freq: Three times a day (TID) | ORAL | Status: DC | PRN

## 2015-02-14 NOTE — Progress Notes (Signed)
Subjective:    Patient ID: Dimitri Ped, female    DOB: 1945-10-23, 69 y.o.   MRN: [id number]  HPI   KAY SHIPPY     MRN: [id number]      DOB: 1945/11/04   HPI Ms. Malicki is here for follow up and re-evaluation of chronic medical conditions, medication management and review of any available recent lab and radiology data.  Preventive health is updated, specifically  Cancer screening and Immunization.   Questions or concerns regarding consultations or procedures which the PT has had in the interim are  addressed. The PT denies any adverse reactions to current medications since the last visit.  Increased and uncontrolled neck and back pain  With spasm x 2 to 4 weeks, radiaites to upper and lower extremities. Denies polyuria, polydipsia, blurred vision , or hypoglycemic episodes.   ROS Denies recent fever or chills. Denies sinus pressure, nasal congestion, ear pain or sore throat. Denies chest congestion, productive cough or wheezing. Denies chest pains, palpitations and leg swelling Denies abdominal pain, nausea, vomiting,diarrhea or constipation.   Denies dysuria, frequency, hesitancy or incontinence.  Denies depression, anxiety or insomnia. Denies skin break down or rash.   PE  BP 118/74 mmHg  Pulse 84  Resp 16  Ht 5\' 3"  (1.6 m)  Wt 211 lb (95.709 kg)  BMI 37.39 kg/m2  SpO2 98%  Patient alert and oriented and in no cardiopulmonary distress.  HEENT: No facial asymmetry, EOMI,   oropharynx pink and moist.  Neck supple no JVD, no mass.  Chest: Clear to auscultation bilaterally.  CVS: S1, S2 no murmurs, no S3.Regular rate.  ABD: Soft non tender.   Ext: No edema  MS: decreased ROM thoraco lumbar spine, adequate in  shoulders, hips and knees.  Skin: Intact, no ulcerations or rash noted.  Psych: Good eye contact, normal affect. Memory intact not anxious or depressed appearing.  CNS: CN 2-12 intact, power,  normal throughout.no focal deficits  noted.   Assessment & Plan   NECK PAIN Uncontrolled.Toradol and depo medrol administered IM in the office , to be followed by a short course of oral prednisone and NSAIDS.  Low back pain is worse, so no imaging now  Bilateral impacted cerumen Symptomatic impaction refer ENT  Essential hypertension Controlled, no change in medication   Diabetes type 2, controlled Ms. Boehringer is reminded of the importance of commitment to daily physical activity for 30 minutes or more, as able and the need to limit carbohydrate intake to 30 to 60 grams per meal to help with blood sugar control.   The need to take medication as prescribed, test blood sugar as directed, and to call between visits if there is a concern that blood sugar is uncontrolled is also discussed.   Ms. Occhipinti is reminded of the importance of daily foot exam, annual eye examination, and good blood sugar, blood pressure and cholesterol control.  Diabetic Labs Latest Ref Rng 02/14/2015 12/12/2014 10/30/2014 08/21/2014 06/15/2014  HbA1c <5.7 % 6.7(H) - 7.6(H) - 6.9(H)  Microalbumin Not estab mg/dL <0.2 - - - -  Micro/Creat Ratio <30 mcg/mg creat SEE NOTE - - - -  Chol 125 - 200 mg/dL - 100(L) - - 180  HDL >=46 mg/dL - 46 - - 64  Calc LDL <130 mg/dL - 33 - - 101(H)  Triglycerides <150 mg/dL - 105 - - 75  Creatinine 0.50 - 0.99 mg/dL 0.89 - 0.91 0.94 0.92   BP/Weight 02/14/2015 11/21/2014 11/05/2014 09/06/2014 08/21/2014 08/16/2014  7/41/6384  Systolic BP 536 468 032 122 482 500 370  Diastolic BP 74 58 68 70 69 55 62  Wt. (Lbs) 211 212.7 215 211 207 - 211  BMI 37.39 37.69 38.09 37.39 36.68 - 37.39   Foot/eye exam completion dates Latest Ref Rng 09/06/2014 07/03/2014  Eye Exam No Retinopathy - No Retinopathy  Foot Form Completion - Done -       Controlled, no change in medication   Hyperlipidemia LDL goal <100 Hyperlipidemia:Low fat diet discussed and encouraged.   Lipid Panel  Lab Results  Component Value Date   CHOL 100* 12/12/2014    HDL 46 12/12/2014   LDLCALC 33 12/12/2014   TRIG 105 12/12/2014   CHOLHDL 2.2 12/12/2014   Controlled, no change in medication      Morbid obesity Improved. Patient re-educated about  the importance of commitment to a  minimum of 150 minutes of exercise per week.  The importance of healthy food choices with portion control discussed. Encouraged to start a food diary, count calories and to consider  joining a support group. Sample diet sheets offered. Goals set by the patient for the next several months.   Weight /BMI 02/14/2015 11/21/2014 11/05/2014  WEIGHT 211 lb 212 lb 11.2 oz 215 lb  HEIGHT 5\' 3"  - 5\' 3"   BMI 37.39 kg/m2 37.69 kg/m2 38.09 kg/m2    Current exercise per week 60 minutes.        Review of Systems     Objective:   Physical Exam        Assessment & Plan:

## 2015-02-14 NOTE — Patient Instructions (Signed)
F/U in 4 month, call if you need me sooner  Injections in office today for neck pain and medication also sent in   Microalb today    HBA1C, chem 7 and EGFR and Hep C screen today  Please work on good  health habits so that your health will improve. 1. Commitment to daily physical activity for 30 to 60  minutes, if you are able to do this.  2. Commitment to wise food choices. Aim for half of your  food intake to be vegetable and fruit, one quarter starchy foods, and one quarter protein. Try to eat on a regular schedule  3 meals per day, snacking between meals should be limited to vegetables or fruits or small portions of nuts. 64 ounces of water per day is generally recommended, unless you have specific health conditions, like heart failure or kidney failure where you will need to limit fluid intake.  3. Commitment to sufficient and a  good quality of physical and mental rest daily, generally between 6 to 8 hours per day.  WITH PERSISTANCE AND PERSEVERANCE, THE IMPOSSIBLE , BECOMES THE NORM!  Thanks for choosing [facility], we consider it a privelige to serve you.

## 2015-02-14 NOTE — Assessment & Plan Note (Signed)
Uncontrolled.Toradol and depo medrol administered IM in the office , to be followed by a short course of oral prednisone and NSAIDS.  Low back pain is worse, so no imaging now

## 2015-02-15 ENCOUNTER — Other Ambulatory Visit: Payer: Self-pay

## 2015-02-15 DIAGNOSIS — M549 Dorsalgia, unspecified: Secondary | ICD-10-CM

## 2015-02-15 LAB — BASIC METABOLIC PANEL WITH GFR
BUN: 16 mg/dL (ref 7–25)
CO2: 24 mmol/L (ref 20–31)
Calcium: 10.9 mg/dL — ABNORMAL HIGH (ref 8.6–10.4)
Chloride: 103 mmol/L (ref 98–110)
Creat: 0.89 mg/dL (ref 0.50–0.99)
GFR, EST NON AFRICAN AMERICAN: 67 mL/min (ref >=60)
GFR, Est African American: 78 mL/min (ref >=60)
GLUCOSE: 68 mg/dL (ref 65–99)
POTASSIUM: 4.5 mmol/L (ref 3.5–5.3)
Sodium: 139 mmol/L (ref 135–146)

## 2015-02-15 LAB — MICROALBUMIN / CREATININE URINE RATIO: CREATININE, URINE: 91 mg/dL (ref 20–320)

## 2015-02-15 LAB — HEMOGLOBIN A1C
HEMOGLOBIN A1C: 6.7 % — AB (ref <5.7)
MEAN PLASMA GLUCOSE: 146 mg/dL — AB (ref <117)

## 2015-02-15 LAB — TSH: TSH: 1.681 u[IU]/mL (ref 0.350–4.500)

## 2015-02-15 LAB — HEPATITIS C ANTIBODY: HCV Ab: NEGATIVE

## 2015-02-15 MED ORDER — HYDROCODONE-ACETAMINOPHEN 5-325 MG PO TABS: ORAL_TABLET | ORAL | Status: DC

## 2015-02-21 ENCOUNTER — Encounter: Payer: PPO | Admitting: Family Medicine

## 2015-02-25 ENCOUNTER — Ambulatory Visit (HOSPITAL_COMMUNITY): Payer: PPO | Admitting: Hematology & Oncology

## 2015-02-25 ENCOUNTER — Ambulatory Visit (HOSPITAL_COMMUNITY): Payer: PPO | Admitting: Oncology

## 2015-02-25 ENCOUNTER — Other Ambulatory Visit (HOSPITAL_COMMUNITY): Payer: PPO

## 2015-02-28 ENCOUNTER — Other Ambulatory Visit: Payer: Self-pay | Admitting: Family Medicine

## 2015-02-28 DIAGNOSIS — Z9889 Other specified postprocedural states: Secondary | ICD-10-CM

## 2015-03-01 NOTE — Assessment & Plan Note (Signed)
Controlled, no change in medication  

## 2015-03-01 NOTE — Assessment & Plan Note (Signed)
Shannon Romero is reminded of the importance of commitment to daily physical activity for 30 minutes or more, as able and the need to limit carbohydrate intake to 30 to 60 grams per meal to help with blood sugar control.   The need to take medication as prescribed, test blood sugar as directed, and to call between visits if there is a concern that blood sugar is uncontrolled is also discussed.   Shannon Romero is reminded of the importance of daily foot exam, annual eye examination, and good blood sugar, blood pressure and cholesterol control.  Diabetic Labs Latest Ref Rng 02/14/2015 12/12/2014 10/30/2014 08/21/2014 06/15/2014  HbA1c <5.7 % 6.7(H) - 7.6(H) - 6.9(H)  Microalbumin Not estab mg/dL <0.2 - - - -  Micro/Creat Ratio <30 mcg/mg creat SEE NOTE - - - -  Chol 125 - 200 mg/dL - 100(L) - - 180  HDL >=46 mg/dL - 46 - - 64  Calc LDL <130 mg/dL - 33 - - 101(H)  Triglycerides <150 mg/dL - 105 - - 75  Creatinine 0.50 - 0.99 mg/dL 0.89 - 0.91 0.94 0.92   BP/Weight 02/14/2015 11/21/2014 11/05/2014 09/06/2014 08/21/2014 08/16/2014 0000000  Systolic BP 123456 0000000 [id number] 123456 123456 [id number] 123456  Diastolic BP 74 58 68 70 69 55 62  Wt. (Lbs) 211 212.7 215 211 207 - 211  BMI 37.39 37.69 38.09 37.39 36.68 - 37.39   Foot/eye exam completion dates Latest Ref Rng 09/06/2014 07/03/2014  Eye Exam No Retinopathy - No Retinopathy  Foot Form Completion - Done -       Controlled, no change in medication

## 2015-03-01 NOTE — Assessment & Plan Note (Signed)
Symptomatic impaction refer ENT

## 2015-03-01 NOTE — Assessment & Plan Note (Signed)
Hyperlipidemia:Low fat diet discussed and encouraged.   Lipid Panel  Lab Results  Component Value Date   CHOL 100* 12/12/2014   HDL 46 12/12/2014   LDLCALC 33 12/12/2014   TRIG 105 12/12/2014   CHOLHDL 2.2 12/12/2014   Controlled, no change in medication

## 2015-03-01 NOTE — Assessment & Plan Note (Signed)
Improved. Patient re-educated about  the importance of commitment to a  minimum of 150 minutes of exercise per week.  The importance of healthy food choices with portion control discussed. Encouraged to start a food diary, count calories and to consider  joining a support group. Sample diet sheets offered. Goals set by the patient for the next several months.   Weight /BMI 02/14/2015 11/21/2014 11/05/2014  WEIGHT 211 lb 212 lb 11.2 oz 215 lb  HEIGHT 5\' 3"  - 5\' 3"   BMI 37.39 kg/m2 37.69 kg/m2 38.09 kg/m2    Current exercise per week 60 minutes.

## 2015-03-11 ENCOUNTER — Other Ambulatory Visit: Payer: Self-pay | Admitting: Family Medicine

## 2015-03-11 NOTE — Progress Notes (Signed)
Shannon Nakayama, MD [address], [address] [place] [address]  Malignant neoplasm of upper-outer quadrant of left female breast [facility]) - Plan: CBC with Differential, Comprehensive metabolic panel  Right trigger finger - Plan: DG Hand Complete Right  CURRENT THERAPY: Anastrozole 1 mg daily beginning on 06/11/2014  INTERVAL HISTORY: Shannon Romero 69 y.o. 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  S/P partial mastectomy, radiation, and now on AI therapy.    Breast cancer ([place])   04/27/2013 Initial Biopsy Breast, left, needle core biopsy, posterior lateral - INVASIVE MAMMARY CARCINOMA   06/07/2013 Definitive Surgery Breast, partial mastectomy, left - INVASIVE DUCTAL CARCINOMA, GRADE 2 OF 3, SPANNING 1.2 CM. - INVASIVE CARCINOMA <0.1 CM OF INFERIOR MARGIN AND CAUTERIZED TUMOR AT LATERAL MARGIN. - DUCTAL CARCINOMA IN SITU, INTERMEDIATE GRADE. - DUCTAL CARCINOMA IN   06/21/2013 Surgery Breast, excision, left - BENIGN BREAST TISSUE AND ADIPOSE TISSUE WITH PREVIOUS EXCISIONAL SITE CHANGES. - NO EVIDENCE OF ATYPIA OR MALIGNANCY. - NEW MARGIN, NEGATIVE FOR ATYPIA OR MALIGNANCY.   07/02/2013 Initial Diagnosis Breast cancer   07/26/2013 Pathology Results Oncotype recurrence score was 17 putting her at average rate of distant recurrence of 10%.   08/21/2013 - 10/05/2013 Radiation Therapy Dr. Pablo Ledger.  45 Gy at 1.8 Gy/fraction x 25 fractions and 16 Gy at 2 Gy/fraction x 8 fractions   10/11/2013 -  Anti-estrogen oral therapy Anastrozole, approximate start date   01/28/2015 Imaging Bone density- This patient is considered normal according to [facility]) criteria. Compared with the prior study on 01/23/2013, the BMD of the left femoral neck shows a statistically significant decrease.     I personally reviewed and went over laboratory results with the patient.  The results are noted within this dictation.  I personally  reviewed and went over radiographic studies with the patient.  The results are noted within this dictation.  Next mammogram is scheduled for 04/01/2015.  Bone density exam was performed on 01/28/2015 demonstrating a normal bone density.  She denies any arthralgias and myalgias from AI therapy.  She is compliant with this medication.  She denies any missed doses.  She notes a right #3 finger swelling with pain at DIP and MIP.  She has decreased ROM of flexion with pain during movement.     Past Medical History  Diagnosis Date  . Hypertension   . Diabetes mellitus   . GERD (gastroesophageal reflux disease)   . Bulging disc   . Breast cancer ([place])     has Diabetes type 2, controlled ([place]); Morbid obesity ([place]); Essential hypertension; GERD; Back pain with radiation; NECK PAIN; Cataracts, bilateral; Family history of colon cancer; Breast cancer ([place]); Shoulder pain, right; Need for vaccination with 13-polyvalent pneumococcal conjugate vaccine; Hyperlipidemia LDL goal <100; Elevated LFTs; Annual physical exam; and Bilateral impacted cerumen on her problem list.     is allergic to tramadol.  Current Outpatient Prescriptions on File Prior to Visit  Medication Sig Dispense Refill  . allopurinol (ZYLOPRIM) 300 MG tablet TAKE ONE TABLET BY MOUTH ONCE DAILY 30 tablet 5  . anastrozole (ARIMIDEX) 1 MG tablet Take 1 tablet (1 mg total) by mouth daily. 90 tablet 3  . aspirin 81 MG tablet Take 81 mg by mouth daily.      . Calcium Carb-Cholecalciferol (CALCIUM + D3) 600-200 MG-UNIT TABS Take 1 tablet by mouth daily.     . cyanocobalamin 100 MCG tablet  Take 100 mcg by mouth daily.    . fish oil-omega-3 fatty acids 1000 MG capsule Take 1 g by mouth daily.      Marland Kitchen gabapentin (NEURONTIN) 600 MG tablet Take 1 tablet (600 mg total) by mouth 3 (three) times daily. 90 tablet 5  . glipiZIDE (GLUCOTROL XL) 2.5 MG 24 hr tablet Take 1 tablet (2.5 mg total) by mouth daily with breakfast. 30 tablet 5  .  HYDROcodone-acetaminophen (NORCO/VICODIN) 5-325 MG tablet One tablet once daily for uncontrolled pain 30 tablet 0  . ibuprofen (ADVIL,MOTRIN) 800 MG tablet Take 1 tablet (800 mg total) by mouth every 8 (eight) hours as needed. 21 tablet 0  . lisinopril-hydrochlorothiazide (PRINZIDE,ZESTORETIC) 20-25 MG tablet TAKE ONE TABLET BY MOUTH ONCE DAILY 90 tablet 1  . metFORMIN (GLUCOPHAGE) 1000 MG tablet TAKE ONE TABLET BY MOUTH TWICE DAILY WITH  A  MEAL 180 tablet 0  . Multiple Vitamin (MULTIVITAMIN) capsule Take 1 capsule by mouth daily.    . pravastatin (PRAVACHOL) 10 MG tablet Take 1 tablet (10 mg total) by mouth daily. 30 tablet 5  . pyridoxine (B-6) 100 MG tablet TWO TABS BY MOUTH DAILY     . venlafaxine XR (EFFEXOR-XR) 75 MG 24 hr capsule Take one capsule at that time to control hot flashes. 90 capsule 3   No current facility-administered medications on file prior to visit.    Past Surgical History  Procedure Laterality Date  . Tubal ligation    . Ovarian cyst removal    . Colonoscopy  03/26/2008    [id number] rectum, scattered pancolonic diverticula and colonic/mucosa appeared normal, normal terminal ileum.  . Colonoscopy  02/28/2003    NUR: A few scattered small diverticula throughout the colon/ No evidence of colonic polyps or neoplasm/ Polyps or other lesions which potentially cause GI blood loss  . Colonoscopy N/A 04/21/2013    Procedure: COLONOSCOPY;  Surgeon: Daneil Dolin, MD;  Location: [facility] END[facility] SUITE;  Service: Endoscopy;  Laterality: N/A;  10:00  . Partial mastectomy with needle localization and axillary sentinel lymph node bx Left 06/07/2013    Procedure: PARTIAL MASTECTOMY WITH NEEDLE LOCALIZATION AND AXILLARY SENTINEL LYMPH NODE BX;  Surgeon: Jamesetta So, MD;  Location: [facility] ORS;  Service: General;  Laterality: Left;  . Re-excision of breast cancer,superior margins Left 06/21/2013    Procedure: RE-EXCISION OF BREAST CANCER;  Surgeon: Jamesetta So, MD;  Location: [facility] ORS;  Service:  General;  Laterality: Left;    Denies any headaches, dizziness, double vision, fevers, chills, night sweats, nausea, vomiting, diarrhea, constipation, chest pain, heart palpitations, shortness of breath, blood in stool, black tarry stool, urinary pain, urinary burning, urinary frequency, hematuria.   PHYSICAL EXAMINATION  ECOG PERFORMANCE STATUS: 0 - Asymptomatic  Filed Vitals:   03/12/15 1300  BP: 132/61  Pulse: 77  Temp: 98.4 F (36.9 C)  Resp: 18    GENERAL:alert, no distress, well nourished, well developed, comfortable, cooperative, obese and smiling SKIN: skin color, texture, turgor are normal, no rashes or significant lesions HEAD: Normocephalic, No masses, lesions, tenderness or abnormalities EYES: EOMI, Conjunctiva are pink and non-injected EARS: External ears normal OROPHARYNX:lips, buccal mucosa, and tongue normal and mucous membranes are moist  NECK: supple, no adenopathy, thyroid normal size, non-tender, without nodularity, no stridor, non-tender, trachea midline LYMPH:  no palpable lymphadenopathy BREAST:not examined LUNGS: clear to auscultation and percussion HEART: regular rate & rhythm, no murmurs, no gallops, S1 normal and S2 normal ABDOMEN:abdomen soft, non-tender, obese and normal bowel sounds  BACK: Back symmetric, no curvature. EXTREMITIES:less then 2 second capillary refill.  #3 right phalanage edema with decreased ROM on flexion with pain on DIP and MIP. NEURO: gait normal    LABORATORY DATA: CBC    Component Value Date/Time   WBC 8.8 03/12/2015 1312   RBC 4.20 03/12/2015 1312   HGB 11.6* 03/12/2015 1312   HCT 35.4* 03/12/2015 1312   PLT 308 03/12/2015 1312   MCV 84.3 03/12/2015 1312   MCH 27.6 03/12/2015 1312   MCHC 32.8 03/12/2015 1312   RDW 17.3* 03/12/2015 1312   LYMPHSABS 2.8 03/12/2015 1312   MONOABS 0.4 03/12/2015 1312   EOSABS 0.2 03/12/2015 1312   BASOSABS 0.0 03/12/2015 1312      Chemistry      Component Value Date/Time   NA  139 02/14/2015 1137   K 4.5 02/14/2015 1137   CL 103 02/14/2015 1137   CO2 24 02/14/2015 1137   BUN 16 02/14/2015 1137   CREATININE 0.89 02/14/2015 1137   CREATININE 0.94 08/21/2014 1023      Component Value Date/Time   CALCIUM 10.9* 02/14/2015 1137   CALCIUM 10.0 08/22/2013 0939   ALKPHOS 63 12/12/2014 1143   AST 17 12/12/2014 1143   ALT 19 12/12/2014 1143   BILITOT 0.3 12/12/2014 1143        PENDING LABS:   RADIOGRAPHIC STUDIES:  No results found.   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  Now on AI therapy.  Oncology history is updated.  Mammogram is scheduled for 04/01/2015.  She is reminded of this scheduled test.  Labs in 4 months: CBC diff, CMET  Bone density exam on 01/28/2015 was normal.  She will be due for her next bone density exam in October 2018.  Right hand xray.    Refer to Dr. Aline Brochure for consultation of right #3 hand phalange with edema, pain, and decreased ROM.  Return in 4 months for follow-up.  If all is well in 4 months, consideration to every 6 months can be considered.    THERAPY PLAN:   NCCN guidelines recommends the following surveillance for invasive breast cancer:  A. History and Physical exam every 4-6 months for 5 years and then every 12 months.  B. Mammography every 12 months  C. Women on Tamoxifen: annual gynecologic assessment every 12 months if uterus is present.  D. Women on aromatase inhibitor or who experience ovarian failure secondary to treatment should have monitoring of bone health with a bone mineral density determination at baseline and periodically thereafter.  E. Assess and encourage adherence to adjuvant endocrine therapy.  F. Evidence suggests that active lifestyle and achieving and maintaining an ideal body weight (20-25 BMI) may lead to optimal breast cancer outcomes.   All questions  were answered. The patient knows to call the clinic with any problems, questions or concerns. We can certainly see the patient much sooner if necessary.  Patient and plan discussed with Dr. Ancil Linsey and she is in agreement with the aforementioned.   This note is electronically signed by: Doy Mince 03/12/2015 2:09 PM

## 2015-03-11 NOTE — Assessment & Plan Note (Addendum)
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  Now on AI therapy.  Oncology history is updated.  Mammogram is scheduled for 04/01/2015.  She is reminded of this scheduled test.  Labs in 4 months: CBC diff, CMET  Bone density exam on 01/28/2015 was normal.  She will be due for her next bone density exam in October 2018.  Right hand xray.    Refer to Dr. Aline Brochure for consultation of right #3 hand phalange with edema, pain, and decreased ROM.  Return in 4 months for follow-up.  If all is well in 4 months, consideration to every 6 months can be considered.

## 2015-03-12 ENCOUNTER — Ambulatory Visit (HOSPITAL_COMMUNITY)
Admission: RE | Admit: 2015-03-12 | Discharge: 2015-03-12 | Disposition: A | Discharge status: Home / Self Care | Payer: PPO | Source: Ambulatory Visit | Attending: Oncology | Admitting: Oncology

## 2015-03-12 ENCOUNTER — Encounter (HOSPITAL_COMMUNITY): Payer: PPO | Attending: Oncology | Admitting: Oncology

## 2015-03-12 ENCOUNTER — Encounter (HOSPITAL_BASED_OUTPATIENT_CLINIC_OR_DEPARTMENT_OTHER): Payer: PPO

## 2015-03-12 ENCOUNTER — Encounter (HOSPITAL_COMMUNITY): Payer: Self-pay | Admitting: Oncology

## 2015-03-12 VITALS — BP 132/61 | HR 77 | Temp 98.4°F | Resp 18 | Wt 213.8 lb

## 2015-03-12 DIAGNOSIS — M1990 Unspecified osteoarthritis, unspecified site: Secondary | ICD-10-CM | POA: Insufficient documentation

## 2015-03-12 DIAGNOSIS — Z79811 Long term (current) use of aromatase inhibitors: Secondary | ICD-10-CM

## 2015-03-12 DIAGNOSIS — M6530 Trigger finger, unspecified finger: Secondary | ICD-10-CM | POA: Diagnosis not present

## 2015-03-12 DIAGNOSIS — C50412 Malignant neoplasm of upper-outer quadrant of left female breast: Secondary | ICD-10-CM | POA: Insufficient documentation

## 2015-03-12 DIAGNOSIS — C50919 Malignant neoplasm of unspecified site of unspecified female breast: Secondary | ICD-10-CM

## 2015-03-12 LAB — COMPREHENSIVE METABOLIC PANEL
ALT: 21 U/L (ref 14–54)
ANION GAP: 10 (ref 5–15)
AST: 22 U/L (ref 15–41)
Albumin: 3.7 g/dL (ref 3.5–5.0)
Alkaline Phosphatase: 70 U/L (ref 38–126)
BUN: 22 mg/dL — ABNORMAL HIGH (ref 6–20)
CHLORIDE: 106 mmol/L (ref 101–111)
CO2: 22 mmol/L (ref 22–32)
Calcium: 10 mg/dL (ref 8.9–10.3)
Creatinine, Ser: 1.06 mg/dL — ABNORMAL HIGH (ref 0.44–1.00)
GFR, EST NON AFRICAN AMERICAN: 53 mL/min — AB (ref >60)
Glucose, Bld: 99 mg/dL (ref 65–99)
POTASSIUM: 4 mmol/L (ref 3.5–5.1)
SODIUM: 138 mmol/L (ref 135–145)
Total Bilirubin: 0.5 mg/dL (ref 0.3–1.2)
Total Protein: 7.6 g/dL (ref 6.5–8.1)

## 2015-03-12 LAB — CBC WITH DIFFERENTIAL/PLATELET
BASOS ABS: 0 10*3/uL (ref 0.0–0.1)
BASOS PCT: 0 %
Eosinophils Absolute: 0.2 10*3/uL (ref 0.0–0.7)
Eosinophils Relative: 2 %
HEMATOCRIT: 35.4 % — AB (ref 36.0–46.0)
HEMOGLOBIN: 11.6 g/dL — AB (ref 12.0–15.0)
Lymphocytes Relative: 32 %
Lymphs Abs: 2.8 10*3/uL (ref 0.7–4.0)
MCH: 27.6 pg (ref 26.0–34.0)
MCHC: 32.8 g/dL (ref 30.0–36.0)
MCV: 84.3 fL (ref 78.0–100.0)
Monocytes Absolute: 0.4 10*3/uL (ref 0.1–1.0)
Monocytes Relative: 5 %
NEUTROS ABS: 5.4 10*3/uL (ref 1.7–7.7)
NEUTROS PCT: 61 %
Platelets: 308 10*3/uL (ref 150–400)
RBC: 4.2 MIL/uL (ref 3.87–5.11)
RDW: 17.3 % — AB (ref 11.5–15.5)
WBC: 8.8 10*3/uL (ref 4.0–10.5)

## 2015-03-12 NOTE — Patient Instructions (Signed)
[place] at [facility] Discharge Instructions  RECOMMENDATIONS MADE BY THE CONSULTANT AND ANY TEST RESULTS WILL BE SENT TO YOUR REFERRING PHYSICIAN.  Exam and discussion by Robynn Pane, PA-C Report any new lumps, bone pain, shortness of breath or other symptoms. Will get xray of your hand on your way out today. Will make a referral to Dr. Aline Brochure (orthopedist) they will call you with an appointment Mammogram as scheduled  Labs and office visit in 4 months   Thank you for choosing [place] at [facility] to provide your oncology and hematology care.  To afford each patient quality time with our provider, please arrive at least 15 minutes before your scheduled appointment time.    You need to re-schedule your appointment should you arrive 10 or more minutes late.  We strive to give you quality time with our providers, and arriving late affects you and other patients whose appointments are after yours.  Also, if you no show three or more times for appointments you may be dismissed from the clinic at the providers discretion.     Again, thank you for choosing [facility].  Our hope is that these requests will decrease the amount of time that you wait before being seen by our physicians.       _____________________________________________________________  Should you have questions after your visit to [facility], please contact our office at [phone] between the hours of 8:30 a.m. and 4:30 p.m.  Voicemails left after 4:30 p.m. will not be returned until the following business day.  For prescription refill requests, have your pharmacy contact our office.

## 2015-03-13 NOTE — Progress Notes (Signed)
Labs drawn

## 2015-03-14 ENCOUNTER — Ambulatory Visit (INDEPENDENT_AMBULATORY_CARE_PROVIDER_SITE_OTHER): Payer: PPO | Admitting: Otolaryngology

## 2015-03-14 ENCOUNTER — Telehealth (HOSPITAL_COMMUNITY): Payer: Self-pay

## 2015-03-14 DIAGNOSIS — H903 Sensorineural hearing loss, bilateral: Secondary | ICD-10-CM

## 2015-03-14 DIAGNOSIS — H6123 Impacted cerumen, bilateral: Secondary | ICD-10-CM | POA: Diagnosis not present

## 2015-03-14 NOTE — Telephone Encounter (Signed)
Message left to call office to discuss.

## 2015-03-14 NOTE — Telephone Encounter (Signed)
-----   Message from Patrici Ranks, MD sent at 03/13/2015  7:02 PM EST -----  Make sure patient is on arimidex. Dr.P

## 2015-03-15 ENCOUNTER — Telehealth (HOSPITAL_COMMUNITY): Payer: Self-pay

## 2015-03-15 NOTE — Telephone Encounter (Signed)
Message left for patient.  Call back requested.

## 2015-03-15 NOTE — Telephone Encounter (Signed)
-----   Message from Patrici Ranks, MD sent at 03/13/2015  7:02 PM EST -----  Make sure patient is on arimidex. Dr.P

## 2015-03-18 ENCOUNTER — Telehealth (HOSPITAL_COMMUNITY): Payer: Self-pay | Admitting: *Deleted

## 2015-03-18 NOTE — Telephone Encounter (Signed)
Pt states that she is taking her Arimidex. Dr. Whitney Muse notified via inbasket messaging.

## 2015-03-19 ENCOUNTER — Encounter: Payer: Self-pay | Admitting: *Deleted

## 2015-03-25 ENCOUNTER — Other Ambulatory Visit: Payer: Self-pay | Admitting: Family Medicine

## 2015-04-01 ENCOUNTER — Encounter (HOSPITAL_COMMUNITY): Payer: PPO

## 2015-04-02 ENCOUNTER — Ambulatory Visit (HOSPITAL_COMMUNITY)
Admission: RE | Admit: 2015-04-02 | Discharge: 2015-04-02 | Disposition: A | Discharge status: Home / Self Care | Payer: PPO | Source: Ambulatory Visit | Attending: Family Medicine | Admitting: Family Medicine

## 2015-04-02 DIAGNOSIS — Z853 Personal history of malignant neoplasm of breast: Secondary | ICD-10-CM | POA: Diagnosis not present

## 2015-04-02 DIAGNOSIS — Z9889 Other specified postprocedural states: Secondary | ICD-10-CM | POA: Insufficient documentation

## 2015-04-09 ENCOUNTER — Other Ambulatory Visit: Payer: Self-pay

## 2015-04-09 ENCOUNTER — Ambulatory Visit (INDEPENDENT_AMBULATORY_CARE_PROVIDER_SITE_OTHER): Payer: PPO | Admitting: Orthopedic Surgery

## 2015-04-09 VITALS — BP 123/64 | Ht 63.0 in | Wt 213.0 lb

## 2015-04-09 DIAGNOSIS — M6530 Trigger finger, unspecified finger: Secondary | ICD-10-CM | POA: Diagnosis not present

## 2015-04-09 DIAGNOSIS — M549 Dorsalgia, unspecified: Secondary | ICD-10-CM

## 2015-04-09 MED ORDER — HYDROCODONE-ACETAMINOPHEN 5-325 MG PO TABS: ORAL_TABLET | ORAL | Status: DC

## 2015-04-09 NOTE — Progress Notes (Signed)
Chief Complaint  Patient presents with  . Hand Pain    Evaluate finger pain. Referred by Dr. Moshe Cipro.    With pain over the A1 pulley. Describes stiffness aching 5 out of 10 x-ray taken no fracture or dislocation before meals my interpretation  She has not improved with ibuprofen.  Review of systems hearing loss back pain swollen joints stiff joints seasonal allergies and bulging disc in the back causing burning pain in her legs  Past Medical History  Diagnosis Date  . Hypertension   . Diabetes mellitus   . GERD (gastroesophageal reflux disease)   . Bulging disc   . Breast cancer ([facility])     BP 123/64 mmHg  Ht 5\' 3"  (1.6 m)  Wt 213 lb (96.616 kg)  BMI 37.74 kg/m2 The patient's appearance is normal she is well-groomed She is oriented 3 Her mood is pleasant Her skin is intact her sensation is normal she has good color and temperature and pulse in the upper extremity. 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  The x-ray is independently interpreted as deformity of the DIP joint mild degenerative changes in the hand  Diagnosis trigger finger  Injection Trigger finger injection  Diagnosis  tenosynovitis right long finger inject right long finger Procedure injection A1 pulley Medications lidocaine 1% 1 mL and Depo-Medrol 40 mg 1 mL Skin prep alcohol and ethyl chloride Verbal consent was obtained Timeout confirmed the injection site  After cleaning the skin with alcohol and anesthetizing the skin with ethyl chloride the A1 pulley was palpated and the injection was performed without complication  Prn follow up

## 2015-04-10 ENCOUNTER — Other Ambulatory Visit: Payer: Self-pay | Admitting: Neurosurgery

## 2015-04-12 ENCOUNTER — Other Ambulatory Visit: Payer: Self-pay | Admitting: Family Medicine

## 2015-04-23 ENCOUNTER — Other Ambulatory Visit: Payer: Self-pay | Admitting: Family Medicine

## 2015-04-29 ENCOUNTER — Encounter (HOSPITAL_COMMUNITY): Payer: Self-pay

## 2015-04-29 ENCOUNTER — Encounter (HOSPITAL_COMMUNITY)
Admission: RE | Admit: 2015-04-29 | Discharge: 2015-04-29 | Disposition: A | Discharge status: Home / Self Care | Payer: PPO | Source: Ambulatory Visit | Attending: Neurosurgery | Admitting: Neurosurgery

## 2015-04-29 DIAGNOSIS — E119 Type 2 diabetes mellitus without complications: Secondary | ICD-10-CM | POA: Diagnosis not present

## 2015-04-29 DIAGNOSIS — Z79899 Other long term (current) drug therapy: Secondary | ICD-10-CM | POA: Diagnosis not present

## 2015-04-29 DIAGNOSIS — Z853 Personal history of malignant neoplasm of breast: Secondary | ICD-10-CM | POA: Diagnosis not present

## 2015-04-29 DIAGNOSIS — Z87891 Personal history of nicotine dependence: Secondary | ICD-10-CM | POA: Insufficient documentation

## 2015-04-29 DIAGNOSIS — K219 Gastro-esophageal reflux disease without esophagitis: Secondary | ICD-10-CM | POA: Insufficient documentation

## 2015-04-29 DIAGNOSIS — Z7984 Long term (current) use of oral hypoglycemic drugs: Secondary | ICD-10-CM | POA: Insufficient documentation

## 2015-04-29 DIAGNOSIS — Z01812 Encounter for preprocedural laboratory examination: Secondary | ICD-10-CM | POA: Insufficient documentation

## 2015-04-29 DIAGNOSIS — Z0183 Encounter for blood typing: Secondary | ICD-10-CM | POA: Insufficient documentation

## 2015-04-29 DIAGNOSIS — M4316 Spondylolisthesis, lumbar region: Secondary | ICD-10-CM | POA: Diagnosis not present

## 2015-04-29 DIAGNOSIS — Z01818 Encounter for other preprocedural examination: Secondary | ICD-10-CM | POA: Diagnosis present

## 2015-04-29 DIAGNOSIS — I10 Essential (primary) hypertension: Secondary | ICD-10-CM | POA: Insufficient documentation

## 2015-04-29 DIAGNOSIS — D649 Anemia, unspecified: Secondary | ICD-10-CM | POA: Insufficient documentation

## 2015-04-29 DIAGNOSIS — Z7982 Long term (current) use of aspirin: Secondary | ICD-10-CM | POA: Diagnosis not present

## 2015-04-29 HISTORY — DX: Anemia, unspecified: D64.9

## 2015-04-29 HISTORY — DX: Family history of other specified conditions: Z84.89

## 2015-04-29 HISTORY — DX: Angina pectoris, unspecified: I20.9

## 2015-04-29 LAB — TYPE AND SCREEN
ABO/RH(D): A POS
Antibody Screen: NEGATIVE

## 2015-04-29 LAB — SURGICAL PCR SCREEN
MRSA, PCR: NEGATIVE
Staphylococcus aureus: NEGATIVE

## 2015-04-29 LAB — BASIC METABOLIC PANEL
Anion gap: 12 (ref 5–15)
BUN: 18 mg/dL (ref 6–20)
CHLORIDE: 104 mmol/L (ref 101–111)
CO2: 23 mmol/L (ref 22–32)
Calcium: 10.6 mg/dL — ABNORMAL HIGH (ref 8.9–10.3)
Creatinine, Ser: 1.3 mg/dL — ABNORMAL HIGH (ref 0.44–1.00)
GFR calc Af Amer: 48 mL/min — ABNORMAL LOW (ref >60)
GFR calc non Af Amer: 41 mL/min — ABNORMAL LOW (ref >60)
GLUCOSE: 77 mg/dL (ref 65–99)
POTASSIUM: 4.2 mmol/L (ref 3.5–5.1)
Sodium: 139 mmol/L (ref 135–145)

## 2015-04-29 LAB — GLUCOSE, CAPILLARY: GLUCOSE-CAPILLARY: 92 mg/dL (ref 65–99)

## 2015-04-29 LAB — CBC
HEMATOCRIT: 37.8 % (ref 36.0–46.0)
HEMOGLOBIN: 11.9 g/dL — AB (ref 12.0–15.0)
MCH: 26.8 pg (ref 26.0–34.0)
MCHC: 31.5 g/dL (ref 30.0–36.0)
MCV: 85.1 fL (ref 78.0–100.0)
Platelets: 293 10*3/uL (ref 150–400)
RBC: 4.44 MIL/uL (ref 3.87–5.11)
RDW: 17.2 % — ABNORMAL HIGH (ref 11.5–15.5)
WBC: 9.3 10*3/uL (ref 4.0–10.5)

## 2015-04-29 LAB — ABO/RH: ABO/RH(D): A POS

## 2015-04-29 NOTE — Progress Notes (Signed)
Patient has had a stress test on 02/26/2012 at [facility] due to chest pains she was having at that time.  She states she had a lot going on family wise at that time and was under great stress.  Reports no other cardiac testing and hasn't seen a cardiologist at any other time.  PCP is [name], MD

## 2015-04-29 NOTE — Progress Notes (Signed)
   04/29/15 1309  OBSTRUCTIVE SLEEP APNEA  Have you ever been diagnosed with sleep apnea through a sleep study? No  Do you snore loudly (loud enough to be heard through closed doors)?  1  Do you often feel tired, fatigued, or sleepy during the daytime (such as falling asleep during driving or talking to someone)? 1  Has anyone observed you stop breathing during your sleep? 0  Do you have, or are you being treated for high blood pressure? 1  BMI more than 35 kg/m2? 1  Age > 50 (1-yes) 1  Neck circumference greater than:Female 16 inches or larger, Female 17inches or larger? 0 (15.5)  Female Gender (Yes=1) 0  Obstructive Sleep Apnea Score 5  Score 5 or greater  Results sent to PCP

## 2015-04-29 NOTE — Pre-Procedure Instructions (Signed)
Shannon Romero  04/29/2015      [facility] - [place], [place] - [address] [place] [address] Phone: [phone] Fax: [place], [place] [address] Phone: [phone] Fax: [phone]    Your procedure is scheduled on Monday January 23rd 2017 at 730am.  Report to [facility] at 530 A.M.  Call this number if you have problems the morning of surgery:  [phone]   Remember:  Do not eat food or drink liquids after midnight Sunday January 22nd.  Take these medicines the morning of surgery with A SIP OF WATER allopurinol (zyloprim), anastrozole (arimidex), gabapentin (neurontin), hydrocodone-acetaminophen (norco/vicodin), venlafaxine xr (effexor) as needed.  Do NOT take your diabetes oral medications the morning of surgery (glipizide (glucotrol XL) or metformin (glucophage) ).   STOP: ALL Vitamins, Supplements, Effient and Herbal Medications, Fish Oils, Aspirins, NSAIDs (Nonsteroidal Anti-inflammatories such as Ibuprofen, Aleve, or Advil), and Goody's/BC Powders 7 days prior to surgery, until after surgery as directed by your physician.     How to Manage Your Diabetes Before Surgery   Why is it important to control my blood sugar before and after surgery?   Improving blood sugar levels before and after surgery helps healing and can limit problems.  A way of improving blood sugar control is eating a healthy diet by:  - Eating less sugar and carbohydrates  - Increasing activity/exercise  - Talk with your doctor about reaching your blood sugar goals  High blood sugars (greater than 180 mg/dL) can raise your risk of infections and slow down your recovery so you will need to focus on controlling your diabetes during the weeks before surgery.  Make sure that the doctor who takes care of your diabetes knows about your planned surgery including  the date and location.  How do I manage my blood sugars before surgery?   Check your blood sugar at least 4 times a day, 2 days before surgery to make sure that they are not too high or low.   Check your blood sugar the morning of your surgery when you wake up and every 2               hours until you get to the Short-Stay unit.  If your blood sugar is less than 70 mg/dL, you will need to treat for low blood sugar by:  Treat a low blood sugar (less than 70 mg/dL) with 1/2 cup of clear juice (cranberry or apple), 4 glucose tablets, OR glucose gel.  Recheck blood sugar in 15 minutes after treatment (to make sure it is greater than 70 mg/dL).  If blood sugar is not greater than 70 mg/dL on re-check, call [phone] for further instructions.   Report your blood sugar to the Short-Stay nurse when you get to Short-Stay.  References:  [facility], 2007 "How to Manage your Diabetes Before and After Surgery".  What do I do about my diabetes medications?   Do not take oral diabetes medicines (pills) the morning of surgery.   Do not wear jewelry, make-up or nail polish.  Do not wear lotions, powders, or perfumes.  You may wear deodorant.  Do not shave 48 hours prior to surgery.  Men may shave face and neck.  Do not bring valuables to the hospital.  [facility] is not responsible for any belongings or valuables.  Contacts, dentures or bridgework may not be worn into surgery.  Leave your suitcase in the car.  After surgery it may be brought to your room.  For patients admitted to the hospital, discharge time will be determined by your treatment team.  Patients discharged the day of surgery will not be allowed to drive home.    Please read over the following fact sheets that you were given. Pain Booklet, Coughing and Deep Breathing, MRSA Information and Surgical Site Infection Prevention

## 2015-04-30 LAB — HEMOGLOBIN A1C
Hgb A1c MFr Bld: 8 % — ABNORMAL HIGH (ref 4.8–5.6)
MEAN PLASMA GLUCOSE: 183 mg/dL

## 2015-04-30 NOTE — Progress Notes (Signed)
Anesthesia Chart Review:  Pt is 70 year old female scheduled for L3-4, L4-5 PLIF on 05/06/2015 with Dr. Arnoldo Morale.   PMH includes:  HTN, DM, anemia, breast cancer, GERD. Former smoker. BMI 38. S/p re-excision L breast cancer 06/21/13. S/p L partial mastectomy 06/07/13.  Medications include: arimidex, ASA, glipizide, lisinopril-hctz, metformin, pravastatin.   Preoperative labs reviewed.  Glucose 77, hgbA1c 8.0.   EKG will need to be obtained DOS.   Nuclear stress test 02/26/12:  - Overall low risk Lexiscan Myoview as outlined. There were no diagnostic ST-segment abnormalities. Perfusion imaging is consistent with soft tissue attenuation affecting the mid to basal anteroseptal and distal anterolateral walls. No ischemic defects are noted. LVEF calculated at 52%   If EKG acceptable DOS, I anticipate pt can proceed as scheduled.   Willeen Cass, FNP-BC [facility]/Anesthesiology Phone: [phone] 04/30/2015 3:43 PM

## 2015-05-05 MED ORDER — CEFAZOLIN SODIUM-DEXTROSE 2-3 GM-% IV SOLR
2.0000 g | INTRAVENOUS | Status: AC
  Administered 2015-05-06: 2 g via INTRAVENOUS
  Filled 2015-05-05: qty 50

## 2015-05-06 ENCOUNTER — Inpatient Hospital Stay (HOSPITAL_COMMUNITY): Payer: PPO | Admitting: Anesthesiology

## 2015-05-06 ENCOUNTER — Encounter (HOSPITAL_COMMUNITY)
Admission: RE | Disposition: A | Discharge status: Home / Self Care | Payer: PPO | Source: Ambulatory Visit | Attending: Neurosurgery

## 2015-05-06 ENCOUNTER — Inpatient Hospital Stay (HOSPITAL_COMMUNITY): Payer: PPO

## 2015-05-06 ENCOUNTER — Inpatient Hospital Stay (HOSPITAL_COMMUNITY)
Admission: RE | Admit: 2015-05-06 | Discharge: 2015-05-08 | DRG: 460 | Disposition: A | Discharge status: Home / Self Care | Payer: PPO | Source: Ambulatory Visit | Attending: Neurosurgery | Admitting: Neurosurgery

## 2015-05-06 ENCOUNTER — Inpatient Hospital Stay (HOSPITAL_COMMUNITY): Payer: PPO | Admitting: Emergency Medicine

## 2015-05-06 ENCOUNTER — Encounter (HOSPITAL_COMMUNITY): Payer: Self-pay | Admitting: Anesthesiology

## 2015-05-06 DIAGNOSIS — M4806 Spinal stenosis, lumbar region: Secondary | ICD-10-CM | POA: Diagnosis present

## 2015-05-06 DIAGNOSIS — M5116 Intervertebral disc disorders with radiculopathy, lumbar region: Secondary | ICD-10-CM | POA: Diagnosis present

## 2015-05-06 DIAGNOSIS — M4316 Spondylolisthesis, lumbar region: Secondary | ICD-10-CM | POA: Diagnosis present

## 2015-05-06 DIAGNOSIS — Z7982 Long term (current) use of aspirin: Secondary | ICD-10-CM

## 2015-05-06 DIAGNOSIS — E119 Type 2 diabetes mellitus without complications: Secondary | ICD-10-CM | POA: Diagnosis present

## 2015-05-06 DIAGNOSIS — M549 Dorsalgia, unspecified: Secondary | ICD-10-CM | POA: Diagnosis present

## 2015-05-06 DIAGNOSIS — Z853 Personal history of malignant neoplasm of breast: Secondary | ICD-10-CM | POA: Diagnosis not present

## 2015-05-06 DIAGNOSIS — Z87891 Personal history of nicotine dependence: Secondary | ICD-10-CM | POA: Diagnosis not present

## 2015-05-06 DIAGNOSIS — I10 Essential (primary) hypertension: Secondary | ICD-10-CM | POA: Diagnosis present

## 2015-05-06 DIAGNOSIS — Z419 Encounter for procedure for purposes other than remedying health state, unspecified: Secondary | ICD-10-CM

## 2015-05-06 DIAGNOSIS — K219 Gastro-esophageal reflux disease without esophagitis: Secondary | ICD-10-CM | POA: Diagnosis present

## 2015-05-06 LAB — GLUCOSE, CAPILLARY
GLUCOSE-CAPILLARY: 144 mg/dL — AB (ref 65–99)
GLUCOSE-CAPILLARY: 179 mg/dL — AB (ref 65–99)
GLUCOSE-CAPILLARY: 164 mg/dL — AB (ref 65–99)
GLUCOSE-CAPILLARY: 188 mg/dL — AB (ref 65–99)

## 2015-05-06 SURGERY — POSTERIOR LUMBAR FUSION 2 LEVEL
Anesthesia: General

## 2015-05-06 MED ORDER — PROPOFOL 10 MG/ML IV BOLUS
INTRAVENOUS | Status: DC | PRN
  Administered 2015-05-06: 200 mg via INTRAVENOUS

## 2015-05-06 MED ORDER — PROPOFOL 10 MG/ML IV BOLUS
INTRAVENOUS | Status: AC
  Filled 2015-05-06: qty 20

## 2015-05-06 MED ORDER — ONDANSETRON HCL 4 MG/2ML IJ SOLN
INTRAMUSCULAR | Status: AC
  Filled 2015-05-06: qty 2

## 2015-05-06 MED ORDER — LIDOCAINE HCL (CARDIAC) 20 MG/ML IV SOLN
INTRAVENOUS | Status: AC
  Filled 2015-05-06: qty 5

## 2015-05-06 MED ORDER — EPHEDRINE SULFATE 50 MG/ML IJ SOLN
INTRAMUSCULAR | Status: AC
  Filled 2015-05-06: qty 1

## 2015-05-06 MED ORDER — SUCCINYLCHOLINE CHLORIDE 20 MG/ML IJ SOLN
INTRAMUSCULAR | Status: AC
  Filled 2015-05-06: qty 1

## 2015-05-06 MED ORDER — DIAZEPAM 5 MG PO TABS: 5.0000 mg | ORAL_TABLET | Freq: Four times a day (QID) | ORAL | Status: DC | PRN

## 2015-05-06 MED ORDER — VANCOMYCIN HCL 1000 MG IV SOLR
INTRAVENOUS | Status: AC
  Filled 2015-05-06: qty 1000

## 2015-05-06 MED ORDER — ONDANSETRON HCL 4 MG/2ML IJ SOLN
4.0000 mg | INTRAMUSCULAR | Status: DC | PRN
Start: 2015-05-06 — End: 2015-05-08
  Administered 2015-05-06: 4 mg via INTRAVENOUS
  Filled 2015-05-06: qty 2

## 2015-05-06 MED ORDER — ALBUMIN HUMAN 5 % IV SOLN
INTRAVENOUS | Status: DC | PRN
  Administered 2015-05-06: 09:00:00 via INTRAVENOUS

## 2015-05-06 MED ORDER — BACITRACIN ZINC 500 UNIT/GM EX OINT
TOPICAL_OINTMENT | CUTANEOUS | Status: DC | PRN
  Administered 2015-05-06: 1 via TOPICAL

## 2015-05-06 MED ORDER — SODIUM CHLORIDE 0.9 % IJ SOLN
INTRAMUSCULAR | Status: AC
  Filled 2015-05-06: qty 10

## 2015-05-06 MED ORDER — MENTHOL 3 MG MT LOZG: 1.0000 | LOZENGE | OROMUCOSAL | Status: DC | PRN

## 2015-05-06 MED ORDER — PRAVASTATIN SODIUM 10 MG PO TABS
10.0000 mg | ORAL_TABLET | Freq: Every day | ORAL | Status: DC
  Administered 2015-05-07: 10 mg via ORAL
  Filled 2015-05-06 (×2): qty 1

## 2015-05-06 MED ORDER — MIDAZOLAM HCL 2 MG/2ML IJ SOLN
INTRAMUSCULAR | Status: AC
  Filled 2015-05-06: qty 2

## 2015-05-06 MED ORDER — PHENOL 1.4 % MT LIQD: 1.0000 | OROMUCOSAL | Status: DC | PRN

## 2015-05-06 MED ORDER — BACITRACIN 50000 UNITS IM SOLR
INTRAMUSCULAR | Status: DC | PRN
  Administered 2015-05-06: 07:00:00

## 2015-05-06 MED ORDER — SODIUM CHLORIDE 0.9 % IV SOLN
10000.0000 ug | INTRAVENOUS | Status: DC | PRN
  Administered 2015-05-06 (×3): 80 ug via INTRAVENOUS

## 2015-05-06 MED ORDER — ARTIFICIAL TEARS OP OINT
TOPICAL_OINTMENT | OPHTHALMIC | Status: DC | PRN
  Administered 2015-05-06: 1 via OPHTHALMIC

## 2015-05-06 MED ORDER — 0.9 % SODIUM CHLORIDE (POUR BTL) OPTIME
TOPICAL | Status: DC | PRN
  Administered 2015-05-06: 1000 mL

## 2015-05-06 MED ORDER — HYDROMORPHONE HCL 1 MG/ML IJ SOLN
INTRAMUSCULAR | Status: DC | PRN
  Administered 2015-05-06: .5 mg via INTRAVENOUS

## 2015-05-06 MED ORDER — ACETAMINOPHEN 650 MG RE SUPP: 650.0000 mg | RECTAL | Status: DC | PRN

## 2015-05-06 MED ORDER — LIDOCAINE HCL (CARDIAC) 20 MG/ML IV SOLN
INTRAVENOUS | Status: DC | PRN
  Administered 2015-05-06: 80 mg via INTRAVENOUS

## 2015-05-06 MED ORDER — VENLAFAXINE HCL ER 75 MG PO CP24
75.0000 mg | ORAL_CAPSULE | Freq: Every day | ORAL | Status: DC
  Administered 2015-05-07 – 2015-05-08 (×2): 75 mg via ORAL
  Filled 2015-05-06 (×2): qty 1

## 2015-05-06 MED ORDER — FENTANYL CITRATE (PF) 100 MCG/2ML IJ SOLN
INTRAMUSCULAR | Status: DC | PRN
  Administered 2015-05-06: 50 ug via INTRAVENOUS
  Administered 2015-05-06: 150 ug via INTRAVENOUS
  Administered 2015-05-06 (×2): 25 ug via INTRAVENOUS
  Administered 2015-05-06 (×5): 50 ug via INTRAVENOUS

## 2015-05-06 MED ORDER — HYDROCHLOROTHIAZIDE 25 MG PO TABS
25.0000 mg | ORAL_TABLET | Freq: Every day | ORAL | Status: DC
  Administered 2015-05-06 – 2015-05-07 (×2): 25 mg via ORAL
  Filled 2015-05-06 (×2): qty 1

## 2015-05-06 MED ORDER — LACTATED RINGERS IV SOLN: INTRAVENOUS | Status: DC

## 2015-05-06 MED ORDER — ACETAMINOPHEN 325 MG PO TABS: 650.0000 mg | ORAL_TABLET | ORAL | Status: DC | PRN

## 2015-05-06 MED ORDER — VANCOMYCIN HCL 1000 MG IV SOLR
INTRAVENOUS | Status: DC | PRN
  Administered 2015-05-06: 1000 mg

## 2015-05-06 MED ORDER — LISINOPRIL-HYDROCHLOROTHIAZIDE 20-25 MG PO TABS: 1.0000 | ORAL_TABLET | Freq: Every day | ORAL | Status: DC

## 2015-05-06 MED ORDER — VECURONIUM BROMIDE 10 MG IV SOLR
INTRAVENOUS | Status: AC
  Filled 2015-05-06: qty 10

## 2015-05-06 MED ORDER — ROCURONIUM BROMIDE 50 MG/5ML IV SOLN
INTRAVENOUS | Status: AC
  Filled 2015-05-06: qty 1

## 2015-05-06 MED ORDER — BUPIVACAINE LIPOSOME 1.3 % IJ SUSP
20.0000 mL | INTRAMUSCULAR | Status: DC
  Filled 2015-05-06: qty 20

## 2015-05-06 MED ORDER — METFORMIN HCL 500 MG PO TABS
1000.0000 mg | ORAL_TABLET | Freq: Two times a day (BID) | ORAL | Status: DC
  Administered 2015-05-06 – 2015-05-08 (×4): 1000 mg via ORAL
  Filled 2015-05-06 (×4): qty 2

## 2015-05-06 MED ORDER — CEFAZOLIN SODIUM-DEXTROSE 2-3 GM-% IV SOLR
2.0000 g | Freq: Three times a day (TID) | INTRAVENOUS | Status: AC
  Administered 2015-05-06 (×2): 2 g via INTRAVENOUS
  Filled 2015-05-06 (×3): qty 50

## 2015-05-06 MED ORDER — FENTANYL CITRATE (PF) 250 MCG/5ML IJ SOLN
INTRAMUSCULAR | Status: AC
  Filled 2015-05-06: qty 5

## 2015-05-06 MED ORDER — DOCUSATE SODIUM 100 MG PO CAPS
100.0000 mg | ORAL_CAPSULE | Freq: Two times a day (BID) | ORAL | Status: DC
  Administered 2015-05-07 – 2015-05-08 (×3): 100 mg via ORAL
  Filled 2015-05-06 (×3): qty 1

## 2015-05-06 MED ORDER — HYDROMORPHONE HCL 1 MG/ML IJ SOLN: 0.2500 mg | INTRAMUSCULAR | Status: DC | PRN

## 2015-05-06 MED ORDER — LACTATED RINGERS IV SOLN
INTRAVENOUS | Status: DC | PRN
  Administered 2015-05-06 (×3): via INTRAVENOUS

## 2015-05-06 MED ORDER — HYDROMORPHONE HCL 1 MG/ML IJ SOLN
INTRAMUSCULAR | Status: AC
  Filled 2015-05-06: qty 1

## 2015-05-06 MED ORDER — ONDANSETRON HCL 4 MG/2ML IJ SOLN
INTRAMUSCULAR | Status: DC | PRN
  Administered 2015-05-06: 4 mg via INTRAVENOUS

## 2015-05-06 MED ORDER — THROMBIN 20000 UNITS EX SOLR
CUTANEOUS | Status: DC | PRN
  Administered 2015-05-06: 07:00:00 via TOPICAL

## 2015-05-06 MED ORDER — BUPIVACAINE-EPINEPHRINE (PF) 0.5% -1:200000 IJ SOLN
INTRAMUSCULAR | Status: DC | PRN
  Administered 2015-05-06: 10 mL via PERINEURAL

## 2015-05-06 MED ORDER — ALUM & MAG HYDROXIDE-SIMETH 200-200-20 MG/5ML PO SUSP: 30.0000 mL | Freq: Four times a day (QID) | ORAL | Status: DC | PRN

## 2015-05-06 MED ORDER — OXYCODONE-ACETAMINOPHEN 5-325 MG PO TABS
1.0000 | ORAL_TABLET | ORAL | Status: DC | PRN
  Administered 2015-05-06 – 2015-05-08 (×4): 2 via ORAL
  Filled 2015-05-06 (×4): qty 2

## 2015-05-06 MED ORDER — MIDAZOLAM HCL 5 MG/5ML IJ SOLN
INTRAMUSCULAR | Status: DC | PRN
  Administered 2015-05-06: 2 mg via INTRAVENOUS

## 2015-05-06 MED ORDER — BUPIVACAINE LIPOSOME 1.3 % IJ SUSP
INTRAMUSCULAR | Status: DC | PRN
  Administered 2015-05-06: 20 mL

## 2015-05-06 MED ORDER — GABAPENTIN 600 MG PO TABS
600.0000 mg | ORAL_TABLET | Freq: Three times a day (TID) | ORAL | Status: DC
  Administered 2015-05-06 – 2015-05-07 (×5): 600 mg via ORAL
  Filled 2015-05-06 (×4): qty 1

## 2015-05-06 MED ORDER — ANASTROZOLE 1 MG PO TABS
1.0000 mg | ORAL_TABLET | Freq: Every day | ORAL | Status: DC
  Administered 2015-05-06 – 2015-05-07 (×2): 1 mg via ORAL
  Filled 2015-05-06 (×3): qty 1

## 2015-05-06 MED ORDER — MORPHINE SULFATE (PF) 2 MG/ML IV SOLN: 1.0000 mg | INTRAVENOUS | Status: DC | PRN

## 2015-05-06 MED ORDER — HYDROCODONE-ACETAMINOPHEN 5-325 MG PO TABS: 1.0000 | ORAL_TABLET | ORAL | Status: DC | PRN

## 2015-05-06 MED ORDER — STERILE WATER FOR INJECTION IJ SOLN
INTRAMUSCULAR | Status: AC
  Filled 2015-05-06: qty 10

## 2015-05-06 MED ORDER — ROCURONIUM BROMIDE 100 MG/10ML IV SOLN
INTRAVENOUS | Status: DC | PRN
  Administered 2015-05-06: 50 mg via INTRAVENOUS

## 2015-05-06 MED ORDER — PHENYLEPHRINE 40 MCG/ML (10ML) SYRINGE FOR IV PUSH (FOR BLOOD PRESSURE SUPPORT)
PREFILLED_SYRINGE | INTRAVENOUS | Status: AC
  Filled 2015-05-06: qty 10

## 2015-05-06 MED ORDER — ARTIFICIAL TEARS OP OINT
TOPICAL_OINTMENT | OPHTHALMIC | Status: AC
  Filled 2015-05-06: qty 3.5

## 2015-05-06 MED ORDER — LISINOPRIL 20 MG PO TABS
20.0000 mg | ORAL_TABLET | Freq: Every day | ORAL | Status: DC
  Administered 2015-05-06 – 2015-05-07 (×2): 20 mg via ORAL
  Filled 2015-05-06 (×2): qty 1

## 2015-05-06 MED ORDER — BISACODYL 10 MG RE SUPP: 10.0000 mg | Freq: Every day | RECTAL | Status: DC | PRN

## 2015-05-06 MED ORDER — VECURONIUM BROMIDE 10 MG IV SOLR
INTRAVENOUS | Status: DC | PRN
  Administered 2015-05-06 (×2): 2 mg via INTRAVENOUS

## 2015-05-06 MED ORDER — GLIPIZIDE ER 2.5 MG PO TB24
2.5000 mg | ORAL_TABLET | Freq: Every day | ORAL | Status: DC
  Administered 2015-05-07 – 2015-05-08 (×2): 2.5 mg via ORAL
  Filled 2015-05-06 (×3): qty 1

## 2015-05-06 MED ORDER — ALLOPURINOL 300 MG PO TABS
300.0000 mg | ORAL_TABLET | Freq: Every day | ORAL | Status: DC
  Administered 2015-05-06 – 2015-05-07 (×2): 300 mg via ORAL
  Filled 2015-05-06 (×3): qty 1

## 2015-05-06 SURGICAL SUPPLY — 69 items
BAG DECANTER FOR FLEXI CONT (MISCELLANEOUS) ×3 IMPLANT
BENZOIN TINCTURE PRP APPL 2/3 (GAUZE/BANDAGES/DRESSINGS) ×3 IMPLANT
BLADE CLIPPER SURG (BLADE) IMPLANT
BRUSH SCRUB EZ PLAIN DRY (MISCELLANEOUS) ×3 IMPLANT
BUR MATCHSTICK NEURO 3.0 LAGG (BURR) ×3 IMPLANT
BUR PRECISION FLUTE 6.0 (BURR) ×3 IMPLANT
CANISTER SUCT 3000ML PPV (MISCELLANEOUS) ×3 IMPLANT
CAP REVERE LOCKING (Cap) ×18 IMPLANT
CLOSURE WOUND 1/2 X4 (GAUZE/BANDAGES/DRESSINGS) ×1
CONN CROSSLINK REV 6.35 48-60 (Connector) ×3 IMPLANT
CONNECTOR CRSLNK REV6.35 48-60 (Connector) ×1 IMPLANT
CONT SPEC 4OZ CLIKSEAL STRL BL (MISCELLANEOUS) ×3 IMPLANT
COVER BACK TABLE 60X90IN (DRAPES) ×3 IMPLANT
DRAPE C-ARM 42X72 X-RAY (DRAPES) ×9 IMPLANT
DRAPE LAPAROTOMY 100X72X124 (DRAPES) ×3 IMPLANT
DRAPE POUCH INSTRU U-SHP 10X18 (DRAPES) ×3 IMPLANT
DRAPE PROXIMA HALF (DRAPES) ×3 IMPLANT
DRAPE SURG 17X23 STRL (DRAPES) ×12 IMPLANT
ELECT BLADE 4.0 EZ CLEAN MEGAD (MISCELLANEOUS) ×6
ELECT REM PT RETURN 9FT ADLT (ELECTROSURGICAL) ×3
ELECTRODE BLDE 4.0 EZ CLN MEGD (MISCELLANEOUS) ×2 IMPLANT
ELECTRODE REM PT RTRN 9FT ADLT (ELECTROSURGICAL) ×1 IMPLANT
EVACUATOR 1/8 PVC DRAIN (DRAIN) ×3 IMPLANT
GAUZE SPONGE 4X4 12PLY STRL (GAUZE/BANDAGES/DRESSINGS) ×3 IMPLANT
GAUZE SPONGE 4X4 16PLY XRAY LF (GAUZE/BANDAGES/DRESSINGS) ×3 IMPLANT
GLOVE BIO SURGEON STRL SZ8 (GLOVE) ×9 IMPLANT
GLOVE BIO SURGEON STRL SZ8.5 (GLOVE) ×6 IMPLANT
GLOVE EXAM NITRILE LRG STRL (GLOVE) IMPLANT
GLOVE EXAM NITRILE MD LF STRL (GLOVE) IMPLANT
GLOVE EXAM NITRILE XL STR (GLOVE) IMPLANT
GLOVE EXAM NITRILE XS STR PU (GLOVE) IMPLANT
GLOVE INDICATOR 7.0 STRL GRN (GLOVE) ×9 IMPLANT
GLOVE INDICATOR 7.5 STRL GRN (GLOVE) ×6 IMPLANT
GLOVE INDICATOR 8.5 STRL (GLOVE) ×3 IMPLANT
GLOVE SURG SS PI 6.5 STRL IVOR (GLOVE) ×18 IMPLANT
GOWN STRL REUS W/ TWL LRG LVL3 (GOWN DISPOSABLE) ×1 IMPLANT
GOWN STRL REUS W/ TWL XL LVL3 (GOWN DISPOSABLE) ×4 IMPLANT
GOWN STRL REUS W/TWL 2XL LVL3 (GOWN DISPOSABLE) ×3 IMPLANT
GOWN STRL REUS W/TWL LRG LVL3 (GOWN DISPOSABLE) ×2
GOWN STRL REUS W/TWL XL LVL3 (GOWN DISPOSABLE) ×8
KIT BASIN OR (CUSTOM PROCEDURE TRAY) ×3 IMPLANT
KIT ROOM TURNOVER OR (KITS) ×3 IMPLANT
MILL MEDIUM DISP (BLADE) ×3 IMPLANT
NEEDLE HYPO 21X1.5 SAFETY (NEEDLE) ×3 IMPLANT
NEEDLE HYPO 22GX1.5 SAFETY (NEEDLE) ×3 IMPLANT
NS IRRIG 1000ML POUR BTL (IV SOLUTION) ×3 IMPLANT
PACK LAMINECTOMY NEURO (CUSTOM PROCEDURE TRAY) ×3 IMPLANT
PAD ARMBOARD 7.5X6 YLW CONV (MISCELLANEOUS) ×9 IMPLANT
PATTIES SURGICAL .5 X.5 (GAUZE/BANDAGES/DRESSINGS) ×3 IMPLANT
PATTIES SURGICAL .5 X1 (DISPOSABLE) IMPLANT
PATTIES SURGICAL 1X1 (DISPOSABLE) ×6 IMPLANT
ROD REVERE CURVED 65MM (Rod) ×6 IMPLANT
SCREW REVERE 6.5X50MM (Screw) ×18 IMPLANT
SPACER ALTERA 10X31 8-12MM-8 (Spacer) ×6 IMPLANT
SPONGE LAP 4X18 X RAY DECT (DISPOSABLE) IMPLANT
SPONGE NEURO XRAY DETECT 1X3 (DISPOSABLE) ×3 IMPLANT
SPONGE SURGIFOAM ABS GEL 100 (HEMOSTASIS) ×3 IMPLANT
STRIP BIOACTIVE 10CC 25X50X8 (Miscellaneous) ×6 IMPLANT
STRIP BIOACTIVE 20CC 25X100X8 (Miscellaneous) ×3 IMPLANT
STRIP CLOSURE SKIN 1/2X4 (GAUZE/BANDAGES/DRESSINGS) ×2 IMPLANT
SUT PROLENE 6 0 BV (SUTURE) ×3 IMPLANT
SUT VIC AB 1 CT1 18XBRD ANBCTR (SUTURE) ×2 IMPLANT
SUT VIC AB 1 CT1 8-18 (SUTURE) ×4
SUT VIC AB 2-0 CP2 18 (SUTURE) ×6 IMPLANT
TAPE CLOTH SURG 4X10 WHT LF (GAUZE/BANDAGES/DRESSINGS) ×3 IMPLANT
TOWEL OR 17X24 6PK STRL BLUE (TOWEL DISPOSABLE) ×3 IMPLANT
TOWEL OR 17X26 10 PK STRL BLUE (TOWEL DISPOSABLE) ×3 IMPLANT
TRAY FOLEY W/METER SILVER 14FR (SET/KITS/TRAYS/PACK) ×6 IMPLANT
WATER STERILE IRR 1000ML POUR (IV SOLUTION) ×3 IMPLANT

## 2015-05-06 NOTE — Anesthesia Preprocedure Evaluation (Addendum)
Anesthesia Evaluation  Patient identified by MRN, date of birth, ID band Patient awake    Reviewed: Allergy & Precautions, H&P , NPO status , Patient's Chart, lab work & pertinent test results  Airway Mallampati: II  TM Distance: >3 FB Neck ROM: full    Dental  (+) Dental Advidsory Given, Edentulous Upper, Edentulous Lower, Upper Dentures, Lower Dentures   Pulmonary neg pulmonary ROS, former smoker,    Pulmonary exam normal breath sounds clear to auscultation       Cardiovascular hypertension, On Medications and Pt. on medications + angina Normal cardiovascular exam Rhythm:regular Rate:Normal  CP 4 years ago with negative cardiac w/u   Neuro/Psych negative neurological ROS  negative psych ROS   GI/Hepatic negative GI ROS, Neg liver ROS, GERD  Medicated and Controlled,  Endo/Other  diabetes, Well Controlled, Type 2, Oral Hypoglycemic Agents  Renal/GU negative Renal ROS  negative genitourinary   Musculoskeletal   Abdominal   Peds  Hematology negative hematology ROS (+) anemia ,   Anesthesia Other Findings   Reproductive/Obstetrics negative OB ROS                           Anesthesia Physical Anesthesia Plan  ASA: III  Anesthesia Plan: General   Post-op Pain Management:    Induction: Intravenous  Airway Management Planned: Oral ETT  Additional Equipment:   Intra-op Plan:   Post-operative Plan: Extubation in OR  Informed Consent: I have reviewed the patients History and Physical, chart, labs and discussed the procedure including the risks, benefits and alternatives for the proposed anesthesia with the patient or authorized representative who has indicated his/her understanding and acceptance.   Dental Advisory Given  Plan Discussed with: CRNA and Surgeon  Anesthesia Plan Comments:         Anesthesia Quick Evaluation

## 2015-05-06 NOTE — Progress Notes (Signed)
Patient ID: Shannon Romero, female   DOB: 02-18-1946, 70 y.o.   MRN: [id number] Subjective:  The patient is alert and pleasant. She looks well. She is in no apparent distress.  Objective: Vital signs in last 24 hours: Temp:  [97.9 F (36.6 C)-99 F (37.2 C)] 98.7 F (37.1 C) (01/23 1537) Pulse Rate:  [89-106] 92 (01/23 1537) Resp:  [16-28] 16 (01/23 1537) BP: (136-182)/(63-84) 151/72 mmHg (01/23 1537) SpO2:  [94 %-100 %] 94 % (01/23 1537)  Intake/Output from previous day:   Intake/Output this shift: Total I/O In: 2750 [I.V.:2500; IV Piggyback:250] Out: 940 [Urine:640; Blood:300]  Physical exam the patient is alert and pleasant. She looks well. She is moving her lower extremities well.  Lab Results: No results for input(s): WBC, HGB, HCT, PLT in the last 72 hours. BMET No results for input(s): NA, K, CL, CO2, GLUCOSE, BUN, CREATININE, CALCIUM in the last 72 hours.  Studies/Results: Dg Lumbar Spine 2-3 Views  05/06/2015  CLINICAL DATA:  Lumbar fusion. EXAM: LUMBAR SPINE - 2-3 VIEW; DG C-ARM 61-120 MIN COMPARISON:  05/06/2015. FINDINGS: L3-L4 and L4-L5 posterior cortical fusion screws. L3-L4 and L4-L5 interbody fusion. Lumbar vertebra numbered as per prior exam. Two images obtained. 0 minutes 21 seconds fluoroscopy time. IMPRESSION: Postsurgical changes lumbar spine as above . Electronically Signed   By: Marcello Moores  Register   On: 05/06/2015 15:18   Dg Lumbar Spine 1 View  05/06/2015  CLINICAL DATA:  L3-5 PLIF EXAM: LUMBAR SPINE - 1 VIEW COMPARISON:  MRI from 02/06/2015. FINDINGS: Same numbering scheme used for today's plain film exam as was employed on previous MRI. Surgical retractors are seen in the posterior soft tissues of lower back. Surgical sponges seen in the operative bed, overlying the L2 and L3 spinous process ease. Radiopaque surgical probe tip overlies a position immediately posterior to the L2-3 facets. IMPRESSION: Intraoperative localization. Electronically Signed   By: Misty Stanley M.D.   On: 05/06/2015 12:01   Dg C-arm 61-120 Min  05/06/2015  CLINICAL DATA:  Lumbar fusion. EXAM: LUMBAR SPINE - 2-3 VIEW; DG C-ARM 61-120 MIN COMPARISON:  05/06/2015. FINDINGS: L3-L4 and L4-L5 posterior cortical fusion screws. L3-L4 and L4-L5 interbody fusion. Lumbar vertebra numbered as per prior exam. Two images obtained. 0 minutes 21 seconds fluoroscopy time. IMPRESSION: Postsurgical changes lumbar spine as above . Electronically Signed   By: Marcello Moores  Register   On: 05/06/2015 15:18    Assessment/Plan: The patient is doing well. I spoke with her husband.  LOS: 0 days     Braylin Xu D 05/06/2015, 3:49 PM

## 2015-05-06 NOTE — H&P (Signed)
Subjective: The patient is a 70 year old black female who has complained of back, buttock, and leg pain consistent with neurogenic claudication. She has failed medical management and was worked up with lumbar x-rays in the lumbar MRI. This demonstrated she had a spondylolisthesis and spinal stenosis at L3-4 and L4-5. I discussed the situation with the patient. We discussed the various treatment options. She has decided to proceed with surgery.   Past Medical History  Diagnosis Date  . Hypertension   . Diabetes mellitus   . GERD (gastroesophageal reflux disease)   . Bulging disc   . Breast cancer ([place])   . Family history of adverse reaction to anesthesia     one sister 'took a long time to wake up after surgery'  . Anginal pain ([place])     approx 4  years ago, had cardiac workup  . Anemia     Past Surgical History  Procedure Laterality Date  . Tubal ligation    . Ovarian cyst removal    . Colonoscopy  03/26/2008    [id number] rectum, scattered pancolonic diverticula and colonic/mucosa appeared normal, normal terminal ileum.  . Colonoscopy  02/28/2003    NUR: A few scattered small diverticula throughout the colon/ No evidence of colonic polyps or neoplasm/ Polyps or other lesions which potentially cause GI blood loss  . Colonoscopy N/A 04/21/2013    Procedure: COLONOSCOPY;  Surgeon: Daneil Dolin, MD;  Location: [facility];  Service: Endoscopy;  Laterality: N/A;  10:00  . Partial mastectomy with needle localization and axillary sentinel lymph node bx Left 06/07/2013    Procedure: PARTIAL MASTECTOMY WITH NEEDLE LOCALIZATION AND AXILLARY SENTINEL LYMPH NODE BX;  Surgeon: Jamesetta So, MD;  Location: [facility] ORS;  Service: General;  Laterality: Left;  . Re-excision of breast cancer,superior margins Left 06/21/2013    Procedure: RE-EXCISION OF BREAST CANCER;  Surgeon: Jamesetta So, MD;  Location: [facility] ORS;  Service: General;  Laterality: Left;  Marland Kitchen Mastectomy Left     partial mastectomy  .  Dilation and curettage of uterus      Allergies  Allergen Reactions  . Tramadol Itching    Social History  Substance Use Topics  . Smoking status: Former Smoker -- 2.00 packs/day for 33 years    Quit date: 04/13/1997  . Smokeless tobacco: Never Used  . Alcohol Use: No    Family History  Problem Relation Age of Onset  . Diabetes Mother   . Heart disease Mother 60    died of mI at age 31  . Colon cancer Father     Diagnosed at age 60, deceased at 58  . Diabetes Sister     X 5  . Hypertension Sister     X 7  . Heart disease Sister 41    X 2  . Diabetes Brother     X 1  . Cancer Brother     LUNG BRAIN AND THROAT   Prior to Admission medications   Medication Sig Start Date End Date Taking? Authorizing Provider  allopurinol (ZYLOPRIM) 300 MG tablet TAKE ONE TABLET BY MOUTH ONCE DAILY 03/11/15  Yes Fayrene Helper, MD  anastrozole (ARIMIDEX) 1 MG tablet Take 1 tablet (1 mg total) by mouth daily. 06/11/14  Yes Baird Cancer, PA-C  aspirin 81 MG tablet Take 81 mg by mouth daily.     Yes Historical Provider, MD  Calcium Carb-Cholecalciferol (CALCIUM + D3) 600-200 MG-UNIT TABS Take 1 tablet by mouth daily.  Yes Historical Provider, MD  cyanocobalamin 100 MCG tablet Take 100 mcg by mouth daily.   Yes Historical Provider, MD  fish oil-omega-3 fatty acids 1000 MG capsule Take 1 g by mouth daily.     Yes Historical Provider, MD  gabapentin (NEURONTIN) 600 MG tablet TAKE ONE TABLET BY MOUTH THREE TIMES DAILY 03/25/15  Yes Fayrene Helper, MD  glipiZIDE (GLUCOTROL XL) 2.5 MG 24 hr tablet Take 1 tablet (2.5 mg total) by mouth daily with breakfast. 11/05/14  Yes Fayrene Helper, MD  HYDROcodone-acetaminophen (NORCO/VICODIN) 5-325 MG tablet One tablet once daily for uncontrolled pain Patient taking differently: Take 1 tablet by mouth at bedtime. One tablet once daily for uncontrolled pain 04/09/15  Yes Fayrene Helper, MD  lisinopril-hydrochlorothiazide (PRINZIDE,ZESTORETIC)  20-25 MG tablet TAKE ONE TABLET BY MOUTH ONCE DAILY 03/11/15  Yes Fayrene Helper, MD  metFORMIN (GLUCOPHAGE) 1000 MG tablet TAKE ONE TABLET BY MOUTH TWICE DAILY WITH A MEAL 04/12/15  Yes Fayrene Helper, MD  Multiple Vitamin (MULTIVITAMIN) capsule Take 1 capsule by mouth daily.   Yes Historical Provider, MD  pravastatin (PRAVACHOL) 10 MG tablet TAKE ONE TABLET BY MOUTH ONCE DAILY 04/23/15  Yes Fayrene Helper, MD  pyridoxine (B-6) 100 MG tablet TWO TABS BY MOUTH DAILY    Yes Historical Provider, MD  venlafaxine XR (EFFEXOR-XR) 75 MG 24 hr capsule Take one capsule at that time to control hot flashes. Patient taking differently: Take 75 mg by mouth daily with breakfast. Take one capsule at that time to control hot flashes. 11/26/14  Yes Manon Hilding Kefalas, PA-C  ibuprofen (ADVIL,MOTRIN) 800 MG tablet Take 1 tablet (800 mg total) by mouth every 8 (eight) hours as needed. Patient not taking: Reported on 04/25/2015 02/14/15   Fayrene Helper, MD     Review of Systems  Positive ROS: As above  All other systems have been reviewed and were otherwise negative with the exception of those mentioned in the HPI and as above.  Objective: Vital signs in last 24 hours: Temp:  [99 F (37.2 C)] 99 F (37.2 C) (01/23 0627) Pulse Rate:  [95] 95 (01/23 0627) Resp:  [16] 16 (01/23 0627) BP: (136)/(63) 136/63 mmHg (01/23 0627) SpO2:  [100 %] 100 % (01/23 0627)  General Appearance: Alert, cooperative, no distress, Head: Normocephalic, without obvious abnormality, atraumatic Eyes: PERRL, conjunctiva/corneas clear, EOM's intact,    Ears: Normal  Throat: Normal  Neck: Supple, symmetrical, trachea midline, no adenopathy; thyroid: No enlargement/tenderness/nodules; no carotid bruit or JVD Back: Symmetric, no curvature, ROM normal, no CVA tenderness Lungs: Clear to auscultation bilaterally, respirations unlabored Heart: Regular rate and rhythm, no murmur, rub or gallop Abdomen: Soft, non-tender,, no  masses, no organomegaly Extremities: Extremities normal, atraumatic, no cyanosis or edema Pulses: 2+ and symmetric all extremities Skin: Skin color, texture, turgor normal, no rashes or lesions  NEUROLOGIC:   Mental status: alert and oriented, no aphasia, good attention span, Fund of knowledge/ memory ok Motor Exam - grossly normal Sensory Exam - grossly normal Reflexes:  Coordination - grossly normal Gait - grossly normal Balance - grossly normal Cranial Nerves: I: smell Not tested  II: visual acuity  OS: Normal  OD: Normal   II: visual fields Full to confrontation  II: pupils Equal, round, reactive to light  III,VII: ptosis None  III,IV,VI: extraocular muscles  Full ROM  V: mastication Normal  V: facial light touch sensation  Normal  V,VII: corneal reflex  Present  VII: facial muscle function -  upper  Normal  VII: facial muscle function - lower Normal  VIII: hearing Not tested  IX: soft palate elevation  Normal  IX,X: gag reflex Present  XI: trapezius strength  5/5  XI: sternocleidomastoid strength 5/5  XI: neck flexion strength  5/5  XII: tongue strength  Normal    Data Review Lab Results  Component Value Date   WBC 9.3 04/29/2015   HGB 11.9* 04/29/2015   HCT 37.8 04/29/2015   MCV 85.1 04/29/2015   PLT 293 04/29/2015   Lab Results  Component Value Date   NA 139 04/29/2015   K 4.2 04/29/2015   CL 104 04/29/2015   CO2 23 04/29/2015   BUN 18 04/29/2015   CREATININE 1.30* 04/29/2015   GLUCOSE 77 04/29/2015   No results found for: INR, PROTIME  Assessment/Plan: 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 I have reviewed her imaging studies with her and pointed out the abnormalities. We have discussed the various treatment options including surgery. I have described the surgical treatment option L3-4 and L4-5 decompression, instrumentation, and fusion. I  have shown her surgical models. We have discussed the risks, benefits, alternatives, and likelihood of achieving goals with surgery. I have answered all the patient's questions. She has decided to proceed with surgery.   Lataunya Ruud D 05/06/2015 7:20 AM

## 2015-05-06 NOTE — Anesthesia Procedure Notes (Signed)
Procedure Name: Intubation Date/Time: 05/06/2015 7:33 AM Performed by: Neldon Newport Pre-anesthesia Checklist: Patient being monitored, Suction available, Emergency Drugs available, Patient identified and Timeout performed Patient Re-evaluated:Patient Re-evaluated prior to inductionOxygen Delivery Method: Circle system utilized Preoxygenation: Pre-oxygenation with 100% oxygen Intubation Type: IV induction Ventilation: Mask ventilation without difficulty Laryngoscope Size: Mac and 3 Grade View: Grade I Tube type: Oral Tube size: 7.0 mm Number of attempts: 1 Placement Confirmation: positive ETCO2,  ETT inserted through vocal cords under direct vision and breath sounds checked- equal and bilateral Secured at: 21 cm Tube secured with: Tape Dental Injury: Teeth and Oropharynx as per pre-operative assessment

## 2015-05-06 NOTE — Transfer of Care (Signed)
Immediate Anesthesia Transfer of Care Note  Patient: Shannon Romero  Procedure(s) Performed: Procedure(s): Lumbar three-four lumbar four-five Posterior lumbar interbody fusion/interbody prosthesis/posterolateral arthrodesis/posterior segmental instrumentation (N/A)  Patient Location: PACU  Anesthesia Type:General  Level of Consciousness: awake, alert  and oriented  Airway & Oxygen Therapy: Patient Spontanous Breathing and Patient connected to face mask oxygen  Post-op Assessment: Report given to RN, Post -op Vital signs reviewed and stable and Patient moving all extremities X 4  Post vital signs: Reviewed and stable  Last Vitals:  Filed Vitals:   05/06/15 0627  BP: 136/63  Pulse: 95  Temp: 37.2 C  Resp: 16    Complications: No apparent anesthesia complications

## 2015-05-06 NOTE — Anesthesia Postprocedure Evaluation (Signed)
Anesthesia Post Note  Patient: Shannon Romero  Procedure(s) Performed: Procedure(s) (LRB): Lumbar three-four lumbar four-five Posterior lumbar interbody fusion/interbody prosthesis/posterolateral arthrodesis/posterior segmental instrumentation (N/A)  Patient location during evaluation: PACU Anesthesia Type: General Level of consciousness: awake and alert Pain management: pain level controlled Vital Signs Assessment: post-procedure vital signs reviewed and stable Respiratory status: spontaneous breathing, nonlabored ventilation, respiratory function stable and patient connected to nasal cannula oxygen Cardiovascular status: blood pressure returned to baseline and stable Postop Assessment: no signs of nausea or vomiting Anesthetic complications: no    Last Vitals:  Filed Vitals:   05/06/15 1415 05/06/15 1430  BP:  176/83  Pulse: 95 95  Temp:    Resp: 23 24    Last Pain:  Filed Vitals:   05/06/15 1432  PainSc: 6                  Shannon Romero

## 2015-05-06 NOTE — Op Note (Signed)
Brief history: the patient is a 70 year old black female who has complained of back, buttock, and leg pain consistent with neurogenic claudication. She has failed medical management and was worked up with a lumbar MRI and lumbar x-rays. These demonstrated spondylolisthesis, spinal stenosis, etc. I discussed the situation with the patient. We discussed the various treatment options. She has decided to proceed with surgery after weighing the risks, benefits, and alternatives.  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 Etc. requiring a wide decompression of the nerve roots.); L3-4 and L4-5 posterior lumbar interbody fusion with local morselized autograft bone and Kinnex graft extender; insertion of interbody prosthesis at L3-4 and L4-5(globus peek expandable interbody prosthesis); posterior segmentalinstrumentation from L3 to L5with globus titanium pedicle screws and rods; posterior lateral arthrodesis at L3-4 and L4-5with local morselized autograft bone and Kinnex bone graft extender.  Surgeon: Dr. Earle Gell  Asst.: Dr. Francesca Jewett  Anesthesia: Gen. endotracheal  Estimated blood loss: 250 mL  Drains: One medium Hemovac  Complications: None  Description of procedure: The patient was brought to the operating room by the anesthesia team. General endotracheal anesthesia was induced. The patient was turned to the prone position on the Wilson frame. The patient's lumbosacral region was then prepared with Betadine scrub and Betadine solution. Sterile drapes  were applied.  I then injected the area to be incised with Marcaine with epinephrine solution. I then used the scalpel to make a linear midline incision over the L3-4 and L4-5interspace. I then used electrocautery to perform a bilateral subperiosteal dissection exposing the spinous process and lamina of L3, L4 and L5. We then obtained intraoperative radiograph to confirm our location. We then inserted the Verstrac retractor to provide exposure.  I began the decompression by using the high speed drill to perform laminotomies at L3-4 and L4-5 bilaterally. We then used the Kerrison punches to completed the L4 laminectomy and to widen the laminotomy bilaterally at L3 and removed the ligamentum flavum at L3-4 and L4-5. We used the Kerrison punches to remove the medial facets at L3-4 and L4-5. We performed wide foraminotomies about the bilateral L3, L4 and L5nerve roots completing the decompression.  We now turned our attention to the posterior lumbar interbody fusion. I used a scalpel to incise the intervertebral disc at L34 and L4-5 bilaterally. I then performed a partial intervertebral discectomy at L3-4 and L4-5 bilaterallyusing the pituitary forceps. We prepared the vertebral endplates at [id number] and [id number] bilaterallyfor the fusion by removing the soft tissues with the curettes. We then used the trial spacers to pick the appropriate sized interbody prosthesis. We prefilled his prosthesis with a combination of local morselized autograft bone that we obtained during the decompression as well as Kinnex bone graft extender. We inserted the prefilled prosthesis into the interspace at L3-4 and L4-5 , we expanded the prosthesis. There was a good snug fit of the prosthesis in the interspace. We then filled and the remainder of the intervertebral disc space with local morselized autograft bone and Kinnex. This completed the posterior lumbar interbody arthrodesis.  We now turned attention to the instrumentation. Under  fluoroscopic guidance we cannulated the bilateral L3,  L4 and L5pedicles with the bone probe. We then removed the bone probe. We then tapped the pedicle with a 5.81millimeter tap. We then removed the tap. We probed inside the tapped pedicle with a ball probe to rule out cortical breaches. We then inserted a 6.5 x 46millimeter pedicle screw into the  L3, L4 and L5pedicles bilaterally under fluoroscopic guidance. We then palpated along the medial aspect of the pedicles to rule out cortical breaches. There were none. The nerve roots were not injured. We then connected the unilateral pedicle screws with a lordotic rod. We compressed the construct and secured the rod in place with the caps. We placed a cross connector between the rods. We then tightened the caps appropriately. This completed the instrumentation from L3-L5.  We now turned our attention to the posterior lateral arthrodesis at L3-4 and L4-5. We used the high-speed drill to decorticate the remainder of the facets, pars, transverse process at L3-4 and L4-5 bilaterally. We then applied a combination of local morselized autograft bone and Kinnex bone graft extender over these decorticated posterior lateral structures. This completed the posterior lateral arthrodesis.  We then obtained hemostasis using bipolar electrocautery. We irrigated the wound out with bacitracin solution. We inspected the thecal sac and nerve roots and noted they were well decompressed. We then removed the retractor.  We placed vancomycin powder in the wound. We placed a medium Hemovac drain in the epidural space and tunneled out through separate stab wound. We reapproximated patient's thoracolumbar fascia with interrupted #1 Vicryl suture. We reapproximated patient's subcutaneous tissue with interrupted 2-0 Vicryl suture. The reapproximated patient's skin with Steri-Strips and benzoin. The wound was then coated with bacitracin ointment. A sterile dressing was applied. The drapes were  removed. The patient was subsequently returned to the supine position where they were extubated by the anesthesia team. He was then transported to the post anesthesia care unit in stable condition. All sponge instrument and needle counts were reportedly correct at the end of this case.

## 2015-05-06 NOTE — Progress Notes (Signed)
Utilization review completed.  

## 2015-05-07 LAB — GLUCOSE, CAPILLARY
GLUCOSE-CAPILLARY: 99 mg/dL (ref 65–99)
Glucose-Capillary: 188 mg/dL — ABNORMAL HIGH (ref 65–99)
Glucose-Capillary: 167 mg/dL — ABNORMAL HIGH (ref 65–99)
Glucose-Capillary: 112 mg/dL — ABNORMAL HIGH (ref 65–99)

## 2015-05-07 LAB — CBC
HCT: 30.8 % — ABNORMAL LOW (ref 36.0–46.0)
Hemoglobin: 9.6 g/dL — ABNORMAL LOW (ref 12.0–15.0)
MCH: 26.1 pg (ref 26.0–34.0)
MCHC: 31.2 g/dL (ref 30.0–36.0)
MCV: 83.7 fL (ref 78.0–100.0)
PLATELETS: 268 10*3/uL (ref 150–400)
RBC: 3.68 MIL/uL — ABNORMAL LOW (ref 3.87–5.11)
RDW: 17.1 % — AB (ref 11.5–15.5)
WBC: 12.1 10*3/uL — ABNORMAL HIGH (ref 4.0–10.5)

## 2015-05-07 LAB — BASIC METABOLIC PANEL
Anion gap: 11 (ref 5–15)
BUN: 10 mg/dL (ref 6–20)
CALCIUM: 9.8 mg/dL (ref 8.9–10.3)
CO2: 25 mmol/L (ref 22–32)
CREATININE: 0.94 mg/dL (ref 0.44–1.00)
Chloride: 104 mmol/L (ref 101–111)
GFR calc Af Amer: >60 mL/min (ref >60)
GFR calc non Af Amer: >60 mL/min (ref >60)
Glucose, Bld: 225 mg/dL — ABNORMAL HIGH (ref 65–99)
Potassium: 4.1 mmol/L (ref 3.5–5.1)
SODIUM: 140 mmol/L (ref 135–145)

## 2015-05-07 MED ORDER — DOCUSATE SODIUM 100 MG PO CAPS: 100.0000 mg | ORAL_CAPSULE | Freq: Two times a day (BID) | ORAL | Status: AC

## 2015-05-07 MED ORDER — CYCLOBENZAPRINE HCL 10 MG PO TABS: 10.0000 mg | ORAL_TABLET | Freq: Three times a day (TID) | ORAL | Status: AC | PRN

## 2015-05-07 MED ORDER — OXYCODONE-ACETAMINOPHEN 10-325 MG PO TABS: 1.0000 | ORAL_TABLET | ORAL | Status: AC | PRN

## 2015-05-07 NOTE — Evaluation (Signed)
Physical Therapy Evaluation Patient Details Name: Shannon Romero MRN: [id number] DOB: 11/11/1945 Today's Date: 05/07/2015   History of Present Illness  Pt is a 70 y/o female who presents s/p L3-L5 PLIF on 05/06/15.  Clinical Impression  Pt admitted with above diagnosis. Pt currently with functional limitations due to the deficits listed below (see PT Problem List). At the time of PT eval pt was able to perform transfers and ambulation with min guard to min assist. Pt lethargic at beginning of session however once walking in the halls appeared to be more awake and engaged in therapy session. Pt will benefit from skilled PT to increase their independence and safety with mobility to allow discharge to the venue listed below.       Follow Up Recommendations Outpatient PT;Supervision for mobility/OOB    Equipment Recommendations  Rolling walker with 5" wheels    Recommendations for Other Services       Precautions / Restrictions Precautions Precautions: Fall;Back Precaution Booklet Issued: Yes (comment) Precaution Comments: Reviewed handout and pt was cued during functional mobility Required Braces or Orthoses: Spinal Brace Spinal Brace: Lumbar corset;Applied in sitting position Restrictions Weight Bearing Restrictions: No      Mobility  Bed Mobility Overal bed mobility: Needs Assistance Bed Mobility: Rolling;Sidelying to Sit Rolling: Min guard Sidelying to sit: Min assist       General bed mobility comments: Heavy use of rails required. Pt was able to transition to EOB with miin assist for trunk elevation and increased time.   Transfers Overall transfer level: Needs assistance Equipment used: Rolling walker (2 wheeled) Transfers: Sit to/from Stand Sit to Stand: Min guard         General transfer comment: Pt was able to power-up to full standing position without assist. Close guard for safety.   Ambulation/Gait Ambulation/Gait assistance: Min guard Ambulation Distance  (Feet): 200 Feet Assistive device: Rolling walker (2 wheeled) Gait Pattern/deviations: Step-through pattern;Decreased stride length;Trunk flexed Gait velocity: Decreased Gait velocity interpretation: Below normal speed for age/gender General Gait Details: Pt was able to ambulate in the hall without assist. No unsteadiness or LOB noted.   Stairs            Wheelchair Mobility    Modified Rankin (Stroke Patients Only)       Balance Overall balance assessment: No apparent balance deficits (not formally assessed)                                           Pertinent Vitals/Pain Pain Assessment: 0-10 Pain Score: 8  Pain Location: Incision Pain Descriptors / Indicators: Operative site guarding;Aching Pain Intervention(s): Limited activity within patient's tolerance;Monitored during session;Repositioned    Home Living Family/patient expects to be discharged to:: Private residence Living Arrangements: Spouse/significant other Available Help at Discharge: Family;Available 24 hours/day Type of Home: House Home Access: Level entry     Home Layout: One level Home Equipment: Cane - single point;Shower seat      Prior Function Level of Independence: Independent with assistive device(s)         Comments: Pt states she uses cane all the time.      Hand Dominance   Dominant Hand: Right    Extremity/Trunk Assessment   Upper Extremity Assessment: Defer to OT evaluation           Lower Extremity Assessment: Generalized weakness      Cervical /  Trunk Assessment: Other exceptions  Communication   Communication: No difficulties  Cognition Arousal/Alertness: Lethargic;Suspect due to medications Behavior During Therapy: Flat affect Overall Cognitive Status: Within Functional Limits for tasks assessed                      General Comments      Exercises        Assessment/Plan    PT Assessment Patient needs continued PT services   PT Diagnosis Difficulty walking;Acute pain   PT Problem List Decreased strength;Decreased range of motion;Decreased activity tolerance;Decreased balance;Decreased mobility;Decreased knowledge of use of DME;Decreased safety awareness;Decreased knowledge of precautions;Pain  PT Treatment Interventions DME instruction;Gait training;Stair training;Functional mobility training;Therapeutic activities;Therapeutic exercise;Neuromuscular re-education;Patient/family education   PT Goals (Current goals can be found in the Care Plan section) Acute Rehab PT Goals Patient Stated Goal: Home tomorrow PT Goal Formulation: With patient/family Time For Goal Achievement: 05/14/15 Potential to Achieve Goals: Good    Frequency Min 5X/week   Barriers to discharge        Co-evaluation               End of Session Equipment Utilized During Treatment:  (Back brace not delivered yet. Per RN ok to walk without.) Activity Tolerance: Patient limited by fatigue Patient left: in chair;with call bell/phone within reach;with family/visitor present Nurse Communication: Mobility status         Time: [id number] PT Time Calculation (min) (ACUTE ONLY): 23 min   Charges:   PT Evaluation $PT Eval Moderate Complexity: 1 Procedure PT Treatments $Gait Training: 8-22 mins   PT G Codes:        Rolinda Roan 05-17-2015, 11:43 AM   Rolinda Roan, PT, DPT Acute Rehabilitation Services Pager: [phone]

## 2015-05-07 NOTE — Discharge Summary (Signed)
Physician Discharge Summary  Patient ID: Shannon Romero MRN: [id number] DOB/AGE: [birth year] 70 y.o.  Admit date: 05/06/2015 Discharge date: 05/07/2015  Admission Diagnoses: L3-4 and L4-5 spinal stenosis, spondylolisthesis, lumbago, lumbar radiculopathy, neurogenic claudication  Discharge Diagnoses: The same Active Problems:   Spondylolisthesis of lumbar region   Discharged Condition: good  Hospital Course: I performed an L3-4 and L4-5 decompression, instrumentation, and fusion on the patient on 05/06/2015. The surgery went well.  The patient's postoperative course was unremarkable. On postoperative day #1 the patient, and her husband, requested discharge home. They were given written and oral discharge instructions. All her questions were answered.  Consults: Physical therapy Significant Diagnostic Studies: None Treatments: L3-4 and L4-5 decompression, instrumentation, and fusion. Discharge Exam: Blood pressure 137/77, pulse 94, temperature 98.7 F (37.1 C), temperature source Oral, resp. rate 18, SpO2 100 %. The patient is alert and pleasant. Her strength is grossly normal in her lower extremities.  Disposition: Home  Discharge Instructions    Call MD for:  difficulty breathing, headache or visual disturbances    Complete by:  As directed      Call MD for:  extreme fatigue    Complete by:  As directed      Call MD for:  hives    Complete by:  As directed      Call MD for:  persistant dizziness or light-headedness    Complete by:  As directed      Call MD for:  persistant nausea and vomiting    Complete by:  As directed      Call MD for:  redness, tenderness, or signs of infection (pain, swelling, redness, odor or green/yellow discharge around incision site)    Complete by:  As directed      Call MD for:  severe uncontrolled pain    Complete by:  As directed      Call MD for:  temperature >100.4    Complete by:  As directed      Diet - low sodium heart healthy     Complete by:  As directed      Discharge instructions    Complete by:  As directed   Call [phone] for a followup appointment. Take a stool softener while you are using pain medications.     Driving Restrictions    Complete by:  As directed   Do not drive for 2 weeks.     Increase activity slowly    Complete by:  As directed      Lifting restrictions    Complete by:  As directed   Do not lift more than 5 pounds. No excessive bending or twisting.     May shower / Bathe    Complete by:  As directed   He may shower after the pain she is removed 3 days after surgery. Leave the incision alone.     Remove dressing in 48 hours    Complete by:  As directed   Your stitches are under the scan and will dissolve by themselves. The Steri-Strips will fall off after you take a few showers. Do not rub back or pick at the wound, Leave the wound alone.            Medication List    STOP taking these medications        HYDROcodone-acetaminophen 5-325 MG tablet  Commonly known as:  NORCO/VICODIN     ibuprofen 800 MG tablet  Commonly known as:  ADVIL,MOTRIN  TAKE these medications        allopurinol 300 MG tablet  Commonly known as:  ZYLOPRIM  TAKE ONE TABLET BY MOUTH ONCE DAILY     anastrozole 1 MG tablet  Commonly known as:  ARIMIDEX  Take 1 tablet (1 mg total) by mouth daily.     aspirin 81 MG tablet  Take 81 mg by mouth daily.     Calcium + D3 600-200 MG-UNIT Tabs  Take 1 tablet by mouth daily.     cyanocobalamin 100 MCG tablet  Take 100 mcg by mouth daily.     cyclobenzaprine 10 MG tablet  Commonly known as:  FLEXERIL  Take 1 tablet (10 mg total) by mouth 3 (three) times daily as needed for muscle spasms.     docusate sodium 100 MG capsule  Commonly known as:  COLACE  Take 1 capsule (100 mg total) by mouth 2 (two) times daily.     fish oil-omega-3 fatty acids 1000 MG capsule  Take 1 g by mouth daily.     gabapentin 600 MG tablet  Commonly known as:  NEURONTIN   TAKE ONE TABLET BY MOUTH THREE TIMES DAILY     glipiZIDE 2.5 MG 24 hr tablet  Commonly known as:  GLUCOTROL XL  Take 1 tablet (2.5 mg total) by mouth daily with breakfast.     lisinopril-hydrochlorothiazide 20-25 MG tablet  Commonly known as:  PRINZIDE,ZESTORETIC  TAKE ONE TABLET BY MOUTH ONCE DAILY     metFORMIN 1000 MG tablet  Commonly known as:  GLUCOPHAGE  TAKE ONE TABLET BY MOUTH TWICE DAILY WITH A MEAL     multivitamin capsule  Take 1 capsule by mouth daily.     oxyCODONE-acetaminophen 10-325 MG tablet  Commonly known as:  PERCOCET  Take 1 tablet by mouth every 4 (four) hours as needed for pain.     pravastatin 10 MG tablet  Commonly known as:  PRAVACHOL  TAKE ONE TABLET BY MOUTH ONCE DAILY      ASK your doctor about these medications        pyridoxine 100 MG tablet  Commonly known as:  B-6  TWO TABS BY MOUTH DAILY     venlafaxine XR 75 MG 24 hr capsule  Commonly known as:  EFFEXOR-XR  Take one capsule at that time to control hot flashes.         SignedOphelia Charter 05/07/2015, 7:48 AM

## 2015-05-08 LAB — BASIC METABOLIC PANEL
ANION GAP: 13 (ref 5–15)
BUN: 24 mg/dL — ABNORMAL HIGH (ref 6–20)
CHLORIDE: 97 mmol/L — AB (ref 101–111)
CO2: 22 mmol/L (ref 22–32)
Calcium: 8.9 mg/dL (ref 8.9–10.3)
Creatinine, Ser: 2.4 mg/dL — ABNORMAL HIGH (ref 0.44–1.00)
GFR calc Af Amer: 23 mL/min — ABNORMAL LOW (ref >60)
GFR, EST NON AFRICAN AMERICAN: 20 mL/min — AB (ref >60)
GLUCOSE: 174 mg/dL — AB (ref 65–99)
POTASSIUM: 4.1 mmol/L (ref 3.5–5.1)
Sodium: 132 mmol/L — ABNORMAL LOW (ref 135–145)

## 2015-05-08 LAB — GLUCOSE, CAPILLARY: Glucose-Capillary: 177 mg/dL — ABNORMAL HIGH (ref 65–99)

## 2015-05-08 MED ORDER — SODIUM CHLORIDE 0.9 % IV BOLUS (SEPSIS)
500.0000 mL | Freq: Once | INTRAVENOUS | Status: AC
  Administered 2015-05-08: 500 mL via INTRAVENOUS

## 2015-05-08 MED FILL — Sodium Chloride IV Soln 0.9%: INTRAVENOUS | Qty: 2000 | Status: AC

## 2015-05-08 MED FILL — Heparin Sodium (Porcine) Inj 1000 Unit/ML: INTRAMUSCULAR | Qty: 30 | Status: AC

## 2015-05-08 NOTE — Progress Notes (Signed)
Physical Therapy Treatment Patient Details Name: Shannon Romero MRN: [id number] DOB: [birth year] Today's Date: 05/08/2015    History of Present Illness Pt is a 70 y/o female who presents s/p L3-L5 PLIF on 05/06/15.    PT Comments    Pt progressing towards physical therapy goals. Was able to perform transfers and ambulation with supervision to min assist for safety and balance support. Discussed car transfer and recommended DME for home. Will continue to follow and progress as able per POC.   Follow Up Recommendations  Outpatient PT;Supervision for mobility/OOB     Equipment Recommendations  Rolling walker with 5" wheels, 3-in-1 BSC   Recommendations for Other Services       Precautions / Restrictions Precautions Precautions: Fall;Back Precaution Booklet Issued: Yes (comment) Precaution Comments: Reviewed handout and pt was cued during functional mobility Required Braces or Orthoses: Spinal Brace Spinal Brace: Lumbar corset;Applied in sitting position Restrictions Weight Bearing Restrictions: No    Mobility  Bed Mobility               General bed mobility comments: Pt sitting EOB when PT arrived.   Transfers Overall transfer level: Needs assistance Equipment used: Rolling walker (2 wheeled) Transfers: Sit to/from Stand Sit to Stand: Min assist         General transfer comment: Bed height slightly elevated as pt was not able to stand from lowest position. Pt required min assist to power-up to full standing position.  Ambulation/Gait Ambulation/Gait assistance: Min guard;Supervision Ambulation Distance (Feet): 300 Feet Assistive device: Rolling walker (2 wheeled) Gait Pattern/deviations: Step-through pattern;Decreased stride length;Wide base of support Gait velocity: Decreased Gait velocity interpretation: Below normal speed for age/gender General Gait Details: Pt was able to ambulate in the hall without assist. No unsteadiness or LOB noted. Occasional drifting  to R when pt distracted.    Stairs            Wheelchair Mobility    Modified Rankin (Stroke Patients Only)       Balance Overall balance assessment: No apparent balance deficits (not formally assessed)                                  Cognition Arousal/Alertness: Awake/alert Behavior During Therapy: Flat affect Overall Cognitive Status: Within Functional Limits for tasks assessed                      Exercises      General Comments        Pertinent Vitals/Pain Pain Assessment: 0-10 Pain Score: 6  Pain Location: Incision site Pain Descriptors / Indicators: Operative site guarding;Discomfort Pain Intervention(s): Limited activity within patient's tolerance;Monitored during session;Repositioned    Home Living                      Prior Function            PT Goals (current goals can now be found in the care plan section) Acute Rehab PT Goals Patient Stated Goal: Home today PT Goal Formulation: With patient/family Time For Goal Achievement: 05/14/15 Potential to Achieve Goals: Good Progress towards PT goals: Progressing toward goals    Frequency  Min 5X/week    PT Plan Current plan remains appropriate    Co-evaluation             End of Session Equipment Utilized During Treatment: Back brace Activity Tolerance: Patient tolerated treatment well  Patient left: in chair;with call bell/phone within reach;with family/visitor present     Time: [id number] PT Time Calculation (min) (ACUTE ONLY): 19 min  Charges:  $Gait Training: 8-22 mins                    G Codes:      Rolinda Roan 05/12/2015, 9:02 AM   Rolinda Roan, PT, DPT Acute Rehabilitation Services Pager: [phone]

## 2015-05-08 NOTE — Discharge Instructions (Signed)
Wound Care Keep incision covered and take a shower  You may remove outer bandage after shower and leave open  Do not put any creams, lotions, or ointments on incision. Leave steri-strips on back.  They will fall off by themselves. Activity Walk each and every day, increasing distance each day. No lifting greater than 5 lbs.  Avoid bending, arching, or twisting. No driving for 2 weeks; may ride as a passenger locally. If provided with back brace, wear when out of bed.  It is not necessary to wear in bed. Diet Resume your normal diet.  Return to Work Will be discussed at you follow up appointment. Call Your Doctor If Any of These Occur Redness, drainage, or swelling at the wound.  Temperature greater than 101 degrees. Severe pain not relieved by pain medication. Incision starts to come apart. Follow Up Appt Call today for appointment in 3 weeks [phone]) or for problems.  If you have any hardware placed in your spine, you will need an x-ray before your appointment.

## 2015-05-13 ENCOUNTER — Other Ambulatory Visit (HOSPITAL_COMMUNITY): Payer: Self-pay | Admitting: Oncology

## 2015-05-29 ENCOUNTER — Emergency Department (HOSPITAL_COMMUNITY)
Admission: EM | Admit: 2015-05-29 | Discharge: 2015-06-12 | Disposition: E | Payer: PPO | Attending: Emergency Medicine | Admitting: Emergency Medicine

## 2015-05-29 DIAGNOSIS — Z7984 Long term (current) use of oral hypoglycemic drugs: Secondary | ICD-10-CM | POA: Diagnosis not present

## 2015-05-29 DIAGNOSIS — I209 Angina pectoris, unspecified: Secondary | ICD-10-CM | POA: Insufficient documentation

## 2015-05-29 DIAGNOSIS — Z853 Personal history of malignant neoplasm of breast: Secondary | ICD-10-CM | POA: Insufficient documentation

## 2015-05-29 DIAGNOSIS — K219 Gastro-esophageal reflux disease without esophagitis: Secondary | ICD-10-CM | POA: Insufficient documentation

## 2015-05-29 DIAGNOSIS — Z87891 Personal history of nicotine dependence: Secondary | ICD-10-CM | POA: Diagnosis not present

## 2015-05-29 DIAGNOSIS — Z862 Personal history of diseases of the blood and blood-forming organs and certain disorders involving the immune mechanism: Secondary | ICD-10-CM | POA: Diagnosis not present

## 2015-05-29 DIAGNOSIS — Z79899 Other long term (current) drug therapy: Secondary | ICD-10-CM | POA: Insufficient documentation

## 2015-05-29 DIAGNOSIS — I10 Essential (primary) hypertension: Secondary | ICD-10-CM | POA: Diagnosis not present

## 2015-05-29 DIAGNOSIS — E119 Type 2 diabetes mellitus without complications: Secondary | ICD-10-CM | POA: Diagnosis not present

## 2015-05-29 DIAGNOSIS — I469 Cardiac arrest, cause unspecified: Secondary | ICD-10-CM | POA: Diagnosis not present

## 2015-05-29 DIAGNOSIS — Z7982 Long term (current) use of aspirin: Secondary | ICD-10-CM | POA: Insufficient documentation

## 2015-06-12 NOTE — ED Notes (Signed)
Husband at bedside.  

## 2015-06-12 NOTE — ED Notes (Signed)
Doctor at bedside, called time of death at 1216-07-31.

## 2015-06-12 NOTE — ED Notes (Addendum)
Patient brought in by EMS in cardiac arrest. Per EMS, family member states patient was walking around "feeling weak" when she suddenly passed out. States patient given EPI x 11, narcan x 1, shock x 4. CBG 230. Patient asystole upon arrival to ED. States CPR done for over 45 minutes. States patient had no palpable pulse entire time CPR in progress. States patient had back surgery 2 weeks ago. Continue CPR in room via ER staff. Dr Rogene Houston at bedside.

## 2015-06-12 NOTE — ED Provider Notes (Signed)
CSN: [id number]     Arrival date & time 06-06-2015  1214 History  By signing my name below, I, Shannon Romero, attest that this documentation has been prepared under the direction and in the presence of Shannon Sorrow, MD. Electronically Signed: Eustaquio Romero, ED Scribe. 2015/06/06. 12:25 PM.   Chief Complaint  Patient presents with  . Cardiac Arrest   LEVEL 5 CAVEAT for unresponsiveness  The history is provided by the EMS personnel. No language interpreter was used.     HPI Comments: Shannon Romero is a 71 y.o. female brought in by ambulance, with hx HTN and DM who presents to the Emergency Department for cardiac arrest that began approximately 45 minutes ago. Per EMS, pt was feeling generally weak today. She was walking around the house when she dropped to the floor, prompting husband to call EMS. EMS performed CPR for approximately 30-40 minutes PTA. Pt was given 11 mg Epinephrine, 1 mg Narcan, and 700 saline bolus en route. Pt was also cardioverted 4 times without change. She had no palpable pulse while CPR was administered.   Past Medical History  Diagnosis Date  . Hypertension   . Diabetes mellitus   . GERD (gastroesophageal reflux disease)   . Bulging disc   . Breast cancer ([place])   . Family history of adverse reaction to anesthesia     one sister 'took a long time to wake up after surgery'  . Anginal pain ([place])     approx 4  years ago, had cardiac workup  . Anemia    Past Surgical History  Procedure Laterality Date  . Tubal ligation    . Ovarian cyst removal    . Colonoscopy  03/26/2008    [id number] rectum, scattered pancolonic diverticula and colonic/mucosa appeared normal, normal terminal ileum.  . Colonoscopy  02/28/2003    NUR: A few scattered small diverticula throughout the colon/ No evidence of colonic polyps or neoplasm/ Polyps or other lesions which potentially cause GI blood loss  . Colonoscopy N/A 04/21/2013    Procedure: COLONOSCOPY;  Surgeon: Daneil Dolin,  MD;  Location: [facility];  Service: Endoscopy;  Laterality: N/A;  10:00  . Partial mastectomy with needle localization and axillary sentinel lymph node bx Left 06/07/2013    Procedure: PARTIAL MASTECTOMY WITH NEEDLE LOCALIZATION AND AXILLARY SENTINEL LYMPH NODE BX;  Surgeon: Jamesetta So, MD;  Location: [facility] ORS;  Service: General;  Laterality: Left;  . Re-excision of breast cancer,superior margins Left 06/21/2013    Procedure: RE-EXCISION OF BREAST CANCER;  Surgeon: Jamesetta So, MD;  Location: [facility];  Service: General;  Laterality: Left;  Marland Kitchen Mastectomy Left     partial mastectomy  . Dilation and curettage of uterus     Family History  Problem Relation Age of Onset  . Diabetes Mother   . Heart disease Mother 14    died of mI at age 81  . Colon cancer Father     Diagnosed at age 16, deceased at 58  . Diabetes Sister     X 5  . Hypertension Sister     X 7  . Heart disease Sister 59    X 2  . Diabetes Brother     X 1  . Cancer Brother     LUNG BRAIN AND THROAT   Social History  Substance Use Topics  . Smoking status: Former Smoker -- 2.00 packs/day for 33 years    Quit date: 04/13/1997  . Smokeless tobacco: Never  Used  . Alcohol Use: No   OB History    No data available     Review of Systems  Unable to perform ROS: Patient unresponsive      Allergies  Tramadol  Home Medications   Prior to Admission medications   Medication Sig Start Date End Date Taking? Authorizing Provider  allopurinol (ZYLOPRIM) 300 MG tablet TAKE ONE TABLET BY MOUTH ONCE DAILY 03/11/15   Fayrene Helper, MD  anastrozole (ARIMIDEX) 1 MG tablet TAKE ONE TABLET BY MOUTH ONCE DAILY 05/14/15   Baird Cancer, PA-C  aspirin 81 MG tablet Take 81 mg by mouth daily.      Historical Provider, MD  Calcium Carb-Cholecalciferol (CALCIUM + D3) 600-200 MG-UNIT TABS Take 1 tablet by mouth daily.     Historical Provider, MD  cyanocobalamin 100 MCG tablet Take 100 mcg by mouth daily.    Historical  Provider, MD  cyclobenzaprine (FLEXERIL) 10 MG tablet Take 1 tablet (10 mg total) by mouth 3 (three) times daily as needed for muscle spasms. 05/07/15   Newman Pies, MD  docusate sodium (COLACE) 100 MG capsule Take 1 capsule (100 mg total) by mouth 2 (two) times daily. 05/07/15   Newman Pies, MD  fish oil-omega-3 fatty acids 1000 MG capsule Take 1 g by mouth daily.      Historical Provider, MD  gabapentin (NEURONTIN) 600 MG tablet TAKE ONE TABLET BY MOUTH THREE TIMES DAILY 03/25/15   Fayrene Helper, MD  glipiZIDE (GLUCOTROL XL) 2.5 MG 24 hr tablet Take 1 tablet (2.5 mg total) by mouth daily with breakfast. 11/05/14   Fayrene Helper, MD  lisinopril-hydrochlorothiazide (PRINZIDE,ZESTORETIC) 20-25 MG tablet TAKE ONE TABLET BY MOUTH ONCE DAILY 03/11/15   Fayrene Helper, MD  metFORMIN (GLUCOPHAGE) 1000 MG tablet TAKE ONE TABLET BY MOUTH TWICE DAILY WITH A MEAL 04/12/15   Fayrene Helper, MD  Multiple Vitamin (MULTIVITAMIN) capsule Take 1 capsule by mouth daily.    Historical Provider, MD  oxyCODONE-acetaminophen (PERCOCET) 10-325 MG tablet Take 1 tablet by mouth every 4 (four) hours as needed for pain. 05/07/15   Newman Pies, MD  pravastatin (PRAVACHOL) 10 MG tablet TAKE ONE TABLET BY MOUTH ONCE DAILY 04/23/15   Fayrene Helper, MD  pyridoxine (B-6) 100 MG tablet TWO TABS BY MOUTH DAILY     Historical Provider, MD  venlafaxine XR (EFFEXOR-XR) 75 MG 24 hr capsule Take one capsule at that time to control hot flashes. Patient taking differently: Take 75 mg by mouth daily with breakfast. Take one capsule at that time to control hot flashes. 11/26/14   Manon Hilding Kefalas, PA-C   Pulse 0  Resp 0  SpO2 0%   Physical Exam  HENT:  Head: Normocephalic and atraumatic.  Eyes:  Pupils are dilated and non responsive   Cardiovascular:  Asystole No carotid pulse  Pulmonary/Chest:  King airway in place No breath sounds  Abdominal: Soft. She exhibits no distension.  Musculoskeletal: She  exhibits no edema.  Left leg IO  Skin: Skin is warm and dry.  Psychiatric: She has a normal mood and affect. Her behavior is normal.  Nursing note and vitals reviewed.   ED Course  Procedures (including critical care time)  DIAGNOSTIC STUDIES: Oxygen Saturation is 0% on RA, low by my interpretation.    COORDINATION OF CARE: 12:18 PM- Time of death called   Labs Review Labs Reviewed - No data to display  Imaging Review No results found.   EKG Interpretation None  CRITICAL CARE Performed by: Shannon Romero Total critical care time: 30 minutes Critical care time was exclusive of separately billable procedures and treating other patients. Critical care was necessary to treat or prevent imminent or life-threatening deterioration. Critical care was time spent personally by me on the following activities: development of treatment plan with patient and/or surrogate as well as nursing, discussions with consultants, evaluation of patient's response to treatment, examination of patient, obtaining history from patient or surrogate, ordering and performing treatments and interventions, ordering and review of laboratory studies, ordering and review of radiographic studies, pulse oximetry and re-evaluation of patient's condition.     MDM   Final diagnoses:  Cardiac arrest [facility])   Patient brought in by EMS no pulse lost all cardiac rhythm asystole 10 minutes prior to arrival. Patient with ongoing CPR. Patient has had CPR ongoing for 45 minutes.  Patient felt fine yesterday this morning got up husband stated she was woozy he was attempting a walker and she collapsed. First responders arrived patient was pulseless. Cardiac rhythm showed what they thought was an atrial fibrillation. Patient was shocked 4 times received 11 mg of epinephrine. Patient stayed in a pulseless electrical activity until approximately 10 minutes prior to arrival when she went into asystole. Patient had IO left  leg had doubt King airway in place with the [facility] airway and bagging there was equal breath sounds. Abdomen was not distended.  Patient was hooked up to 2 monitors both showed asystole when CPR was stopped.   Patient pronounced at 07-22-1216. Pupils dilated unresponsive no carotid pulse no heart sounds no heart movement. No respiratory effort. Discussed with medical examiner and they have released the case. Discussed with the patient's primary care office they will handle death certificate.  I personally performed the services described in this documentation, which was scribed in my presence. The recorded information has been reviewed and is accurate.        Shannon Sorrow, MD Jun 07, 2015 [id number]

## 2015-06-12 DEATH — deceased

## 2015-06-17 ENCOUNTER — Encounter: Payer: PPO | Admitting: Family Medicine

## 2015-07-10 ENCOUNTER — Other Ambulatory Visit (HOSPITAL_COMMUNITY): Payer: PPO

## 2015-07-10 ENCOUNTER — Ambulatory Visit (HOSPITAL_COMMUNITY): Payer: PPO | Admitting: Hematology & Oncology

## 2016-01-13 IMAGING — DX DG HAND COMPLETE 3+V*R*
3 series · 3 of 3 positions shown · non-contrast
Comparison: None.

CLINICAL DATA: Right third phalanx edema and pain with decreased
motion for 2-3 weeks possible arthritis no known injury

EXAM:
RIGHT HAND - COMPLETE 3+ VIEW

[hand pa]
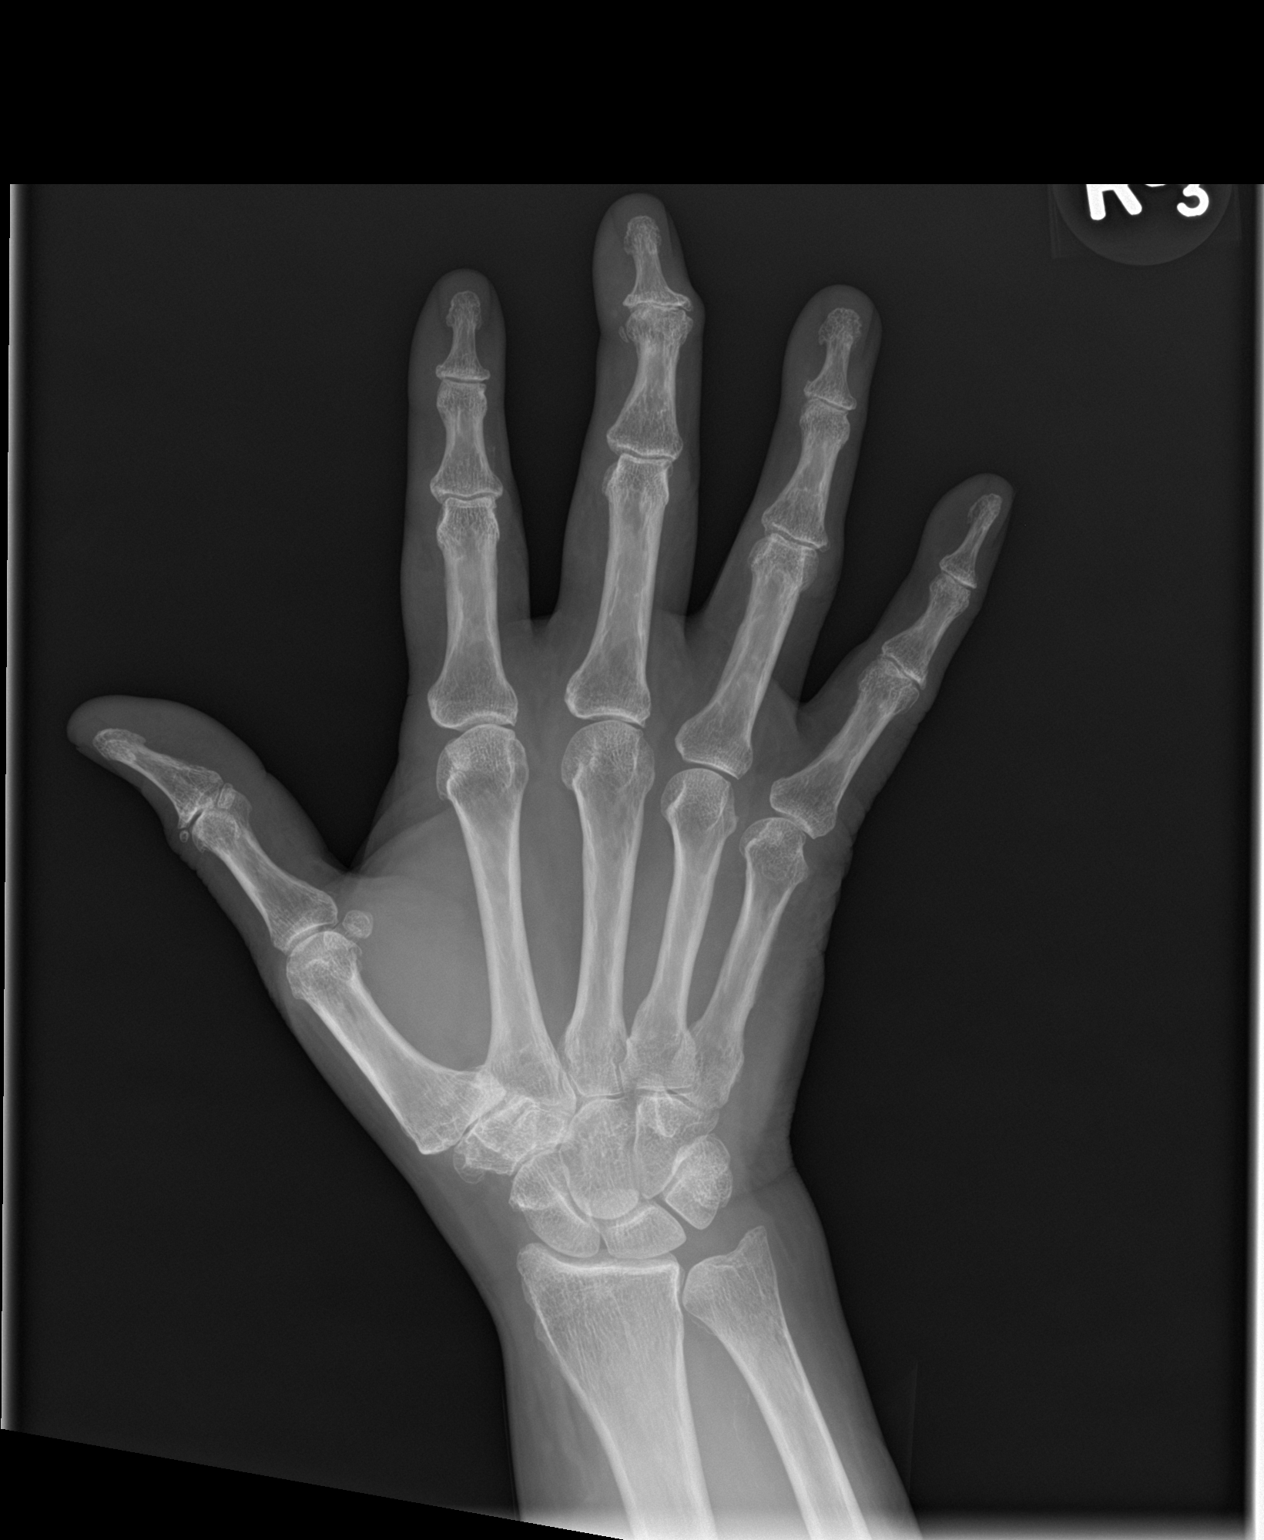

[hand obl]
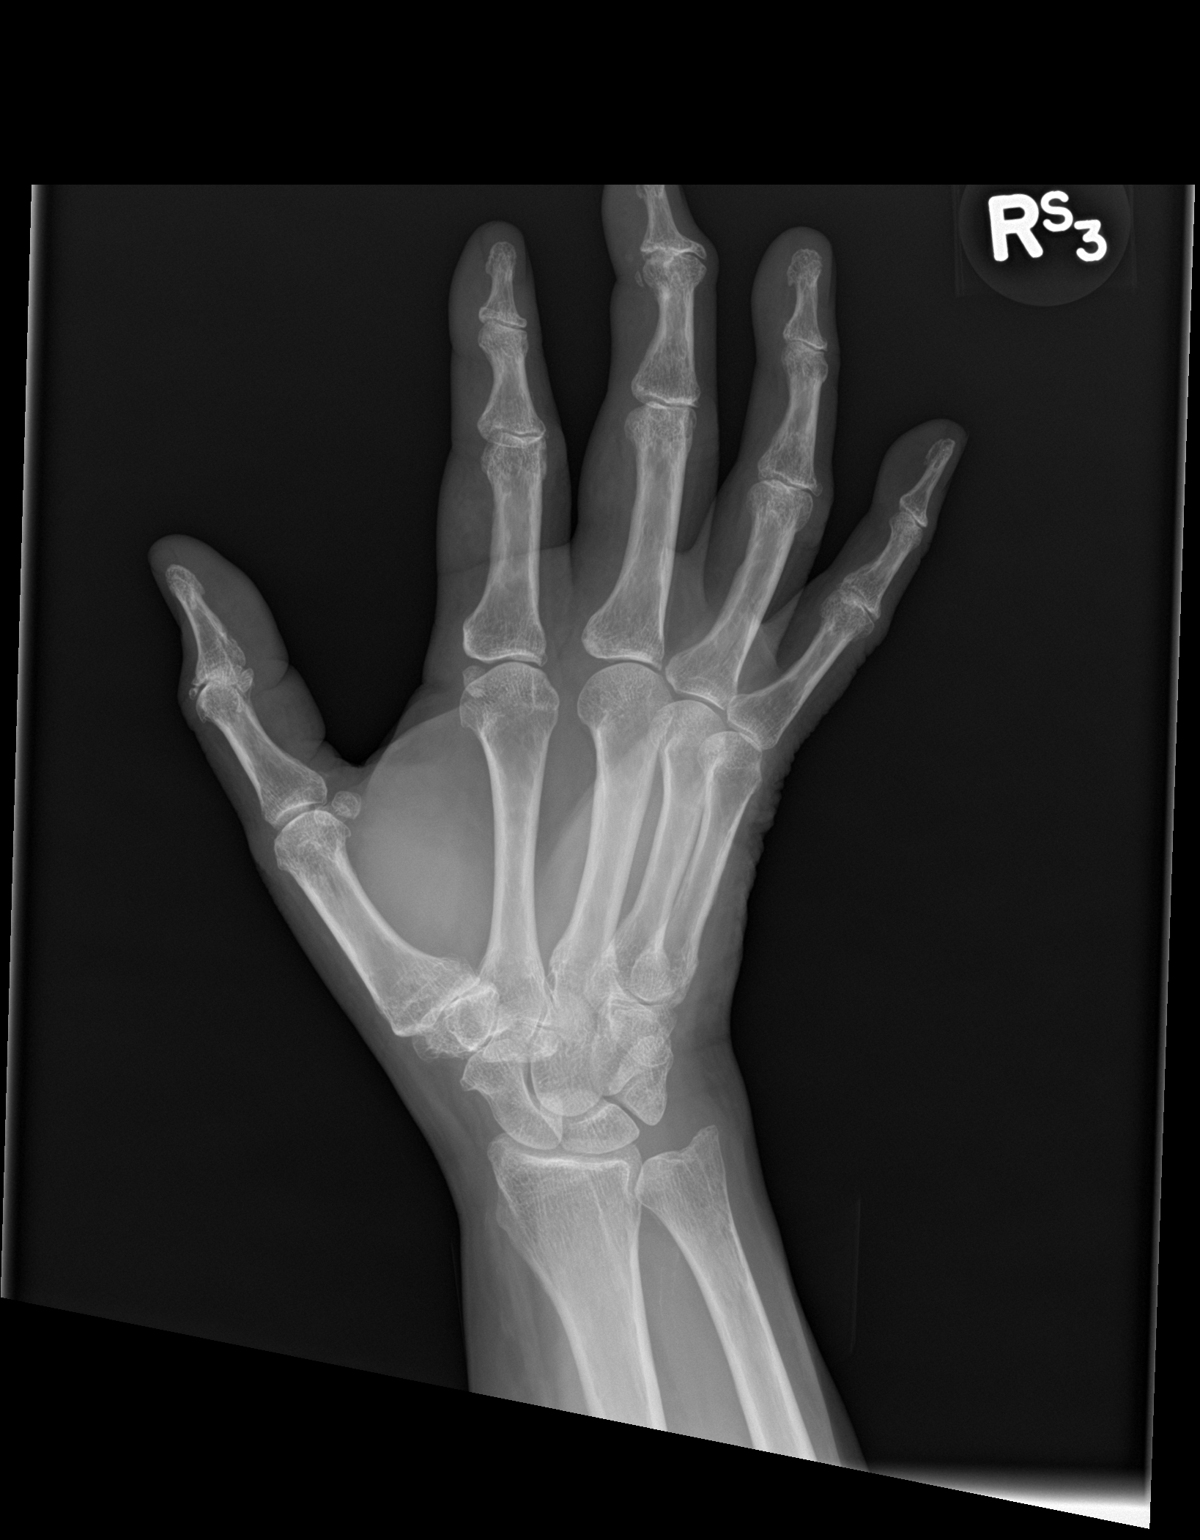

[hand lat]
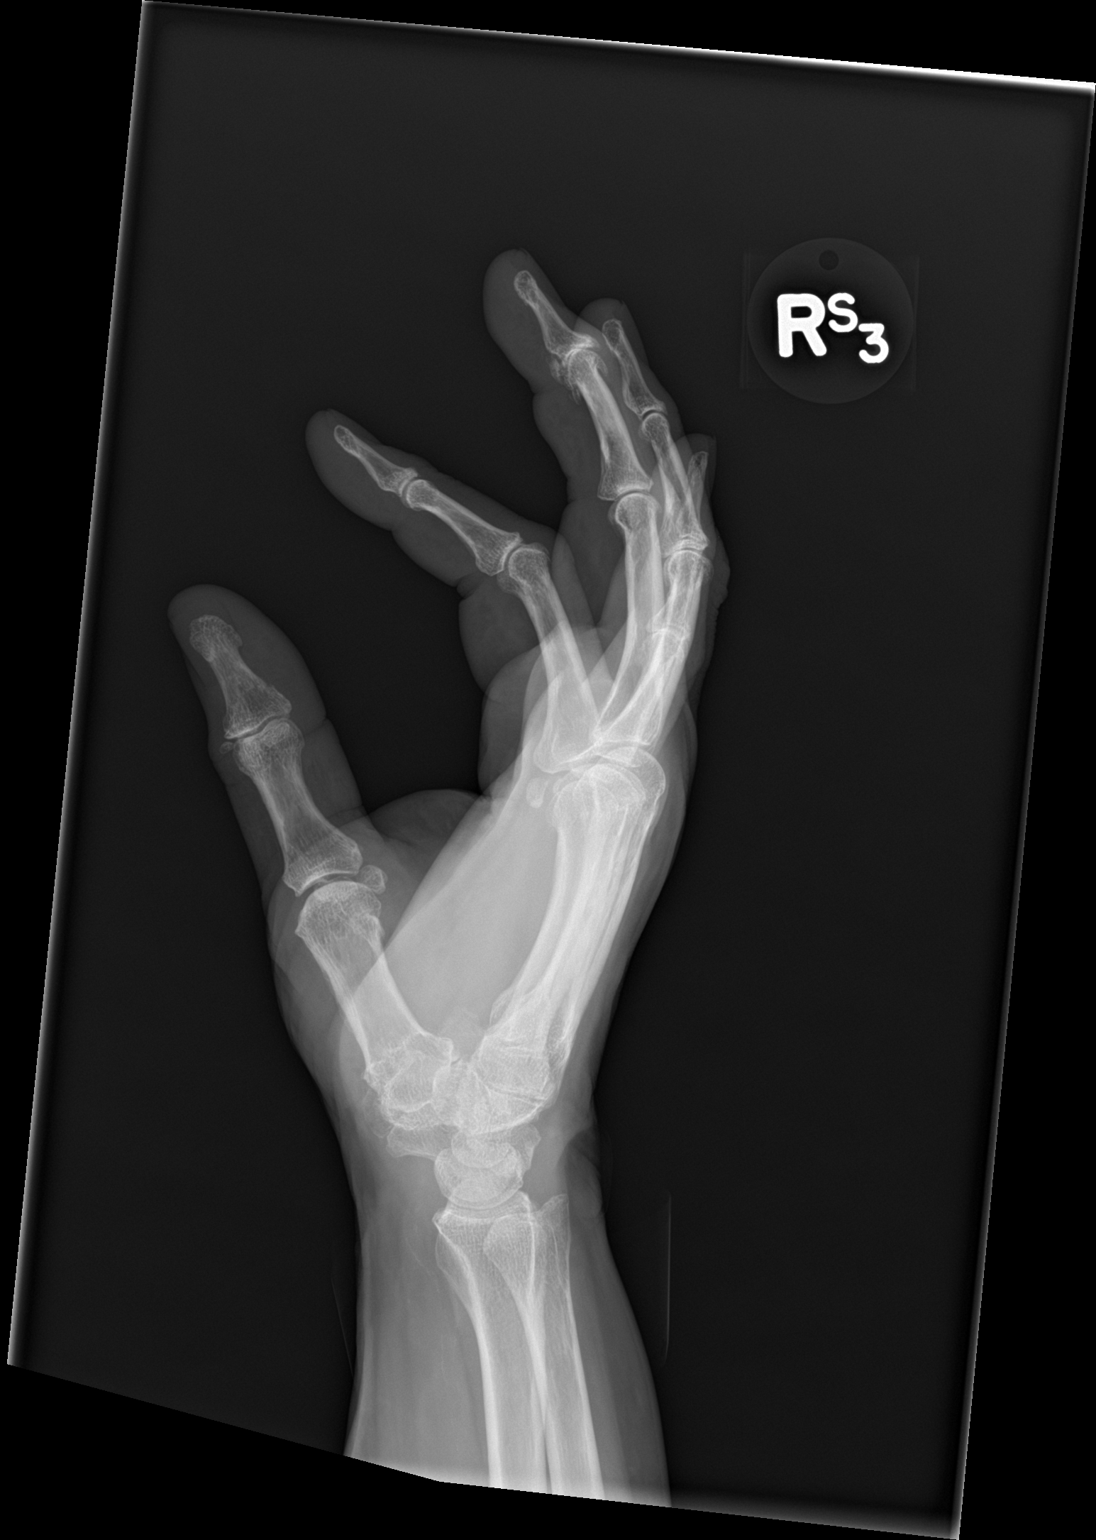

[3 of 3 positions shown; findings below may reference images not displayed]

FINDINGS: Mild osteoarthritis of the distal interphalangeal joints except for
the third digit which shows moderate to severe arthritic change with
narrowing and prominent osteophyte formation. No fracture or
dislocation. Mild arthritic change first carpometacarpal joint.
IMPRESSION: Diffuse arthritis, particularly pronounced at the third distal
interphalangeal jaw
